# Patient Record
Sex: Female | Born: 1945
Health system: Southern US, Community
[De-identification: ages and names within clinical notes are randomized; demographics above are authoritative.]

## PROBLEM LIST (undated history)

## (undated) DIAGNOSIS — E785 Hyperlipidemia, unspecified: Secondary | ICD-10-CM

## (undated) DIAGNOSIS — F329 Major depressive disorder, single episode, unspecified: Secondary | ICD-10-CM

## (undated) DIAGNOSIS — Z9989 Dependence on other enabling machines and devices: Secondary | ICD-10-CM

## (undated) DIAGNOSIS — F32A Depression, unspecified: Secondary | ICD-10-CM

## (undated) DIAGNOSIS — G4733 Obstructive sleep apnea (adult) (pediatric): Secondary | ICD-10-CM

## (undated) DIAGNOSIS — E119 Type 2 diabetes mellitus without complications: Secondary | ICD-10-CM

## (undated) DIAGNOSIS — Z9289 Personal history of other medical treatment: Secondary | ICD-10-CM

## (undated) DIAGNOSIS — J40 Bronchitis, not specified as acute or chronic: Secondary | ICD-10-CM

## (undated) DIAGNOSIS — Z8673 Personal history of transient ischemic attack (TIA), and cerebral infarction without residual deficits: Secondary | ICD-10-CM

## (undated) DIAGNOSIS — I10 Essential (primary) hypertension: Secondary | ICD-10-CM

## (undated) DIAGNOSIS — R0602 Shortness of breath: Secondary | ICD-10-CM

## (undated) DIAGNOSIS — J189 Pneumonia, unspecified organism: Secondary | ICD-10-CM

## (undated) DIAGNOSIS — G43909 Migraine, unspecified, not intractable, without status migrainosus: Secondary | ICD-10-CM

## (undated) DIAGNOSIS — K5792 Diverticulitis of intestine, part unspecified, without perforation or abscess without bleeding: Secondary | ICD-10-CM

## (undated) HISTORY — DX: Depression, unspecified: F32.A

## (undated) HISTORY — DX: Major depressive disorder, single episode, unspecified: F32.9

## (undated) HISTORY — DX: Personal history of transient ischemic attack (TIA), and cerebral infarction without residual deficits: Z86.73

## (undated) HISTORY — DX: Diverticulitis of intestine, part unspecified, without perforation or abscess without bleeding: K57.92

## (undated) HISTORY — DX: Essential (primary) hypertension: I10

## (undated) HISTORY — DX: Hyperlipidemia, unspecified: E78.5

---

## 1987-02-12 HISTORY — PX: LEFT OOPHORECTOMY: SHX1961

## 1988-01-18 HISTORY — PX: COLECTOMY: SHX59

## 1988-01-18 HISTORY — PX: COLOSTOMY: SHX63

## 1988-04-11 HISTORY — PX: APPENDECTOMY: SHX54

## 1988-04-11 HISTORY — PX: COLOSTOMY REVERSAL: SHX5782

## 1988-04-11 HISTORY — PX: COLECTOMY: SHX59

## 1998-10-13 HISTORY — PX: ABDOMINAL HYSTERECTOMY: SHX81

## 1999-02-12 DIAGNOSIS — Z8673 Personal history of transient ischemic attack (TIA), and cerebral infarction without residual deficits: Secondary | ICD-10-CM

## 1999-02-12 HISTORY — DX: Personal history of transient ischemic attack (TIA), and cerebral infarction without residual deficits: Z86.73

## 2004-04-19 ENCOUNTER — Emergency Department (HOSPITAL_COMMUNITY): Admission: EM | Admit: 2004-04-19 | Discharge: 2004-04-19 | Payer: Self-pay | Admitting: Emergency Medicine

## 2004-05-18 ENCOUNTER — Other Ambulatory Visit: Admission: RE | Admit: 2004-05-18 | Discharge: 2004-05-18 | Payer: Self-pay | Admitting: Family Medicine

## 2004-12-28 ENCOUNTER — Encounter: Admission: RE | Admit: 2004-12-28 | Discharge: 2005-01-25 | Payer: Self-pay | Admitting: Family Medicine

## 2005-07-10 ENCOUNTER — Inpatient Hospital Stay (HOSPITAL_COMMUNITY): Admission: AD | Admit: 2005-07-10 | Discharge: 2005-07-10 | Payer: Self-pay | Admitting: Gynecology

## 2005-07-12 ENCOUNTER — Encounter (INDEPENDENT_AMBULATORY_CARE_PROVIDER_SITE_OTHER): Payer: Self-pay | Admitting: *Deleted

## 2005-07-12 ENCOUNTER — Ambulatory Visit: Payer: Self-pay | Admitting: *Deleted

## 2005-07-12 ENCOUNTER — Other Ambulatory Visit: Admission: RE | Admit: 2005-07-12 | Discharge: 2005-07-12 | Payer: Self-pay | Admitting: *Deleted

## 2005-07-26 ENCOUNTER — Ambulatory Visit: Payer: Self-pay | Admitting: Obstetrics & Gynecology

## 2005-07-30 ENCOUNTER — Encounter (INDEPENDENT_AMBULATORY_CARE_PROVIDER_SITE_OTHER): Payer: Self-pay | Admitting: Specialist

## 2005-07-30 ENCOUNTER — Ambulatory Visit: Payer: Self-pay | Admitting: Obstetrics & Gynecology

## 2005-07-30 ENCOUNTER — Ambulatory Visit (HOSPITAL_COMMUNITY): Admission: RE | Admit: 2005-07-30 | Discharge: 2005-07-30 | Payer: Self-pay | Admitting: Obstetrics & Gynecology

## 2005-08-29 ENCOUNTER — Ambulatory Visit: Payer: Self-pay | Admitting: Obstetrics and Gynecology

## 2005-12-11 ENCOUNTER — Other Ambulatory Visit: Admission: RE | Admit: 2005-12-11 | Discharge: 2005-12-11 | Payer: Self-pay | Admitting: Obstetrics and Gynecology

## 2006-01-13 ENCOUNTER — Encounter (INDEPENDENT_AMBULATORY_CARE_PROVIDER_SITE_OTHER): Payer: Self-pay | Admitting: Specialist

## 2006-01-13 ENCOUNTER — Inpatient Hospital Stay (HOSPITAL_COMMUNITY): Admission: RE | Admit: 2006-01-13 | Discharge: 2006-01-16 | Payer: Self-pay | Admitting: Obstetrics and Gynecology

## 2006-08-05 LAB — HM MAMMOGRAPHY: HM Mammogram: NORMAL

## 2007-04-07 ENCOUNTER — Encounter: Payer: Self-pay | Admitting: Endocrinology

## 2007-04-29 ENCOUNTER — Ambulatory Visit: Payer: Self-pay | Admitting: Endocrinology

## 2007-04-29 DIAGNOSIS — Z8679 Personal history of other diseases of the circulatory system: Secondary | ICD-10-CM

## 2007-04-29 DIAGNOSIS — Z8719 Personal history of other diseases of the digestive system: Secondary | ICD-10-CM | POA: Insufficient documentation

## 2007-04-29 DIAGNOSIS — F329 Major depressive disorder, single episode, unspecified: Secondary | ICD-10-CM

## 2007-04-29 DIAGNOSIS — R609 Edema, unspecified: Secondary | ICD-10-CM

## 2007-04-29 DIAGNOSIS — I10 Essential (primary) hypertension: Secondary | ICD-10-CM

## 2007-04-29 DIAGNOSIS — E785 Hyperlipidemia, unspecified: Secondary | ICD-10-CM | POA: Insufficient documentation

## 2007-04-29 DIAGNOSIS — E669 Obesity, unspecified: Secondary | ICD-10-CM

## 2007-06-01 ENCOUNTER — Ambulatory Visit: Payer: Self-pay | Admitting: Endocrinology

## 2007-07-07 ENCOUNTER — Ambulatory Visit: Payer: Self-pay | Admitting: Endocrinology

## 2007-08-06 ENCOUNTER — Ambulatory Visit: Payer: Self-pay | Admitting: Endocrinology

## 2007-08-10 ENCOUNTER — Telehealth (INDEPENDENT_AMBULATORY_CARE_PROVIDER_SITE_OTHER): Payer: Self-pay | Admitting: *Deleted

## 2007-10-30 ENCOUNTER — Telehealth: Payer: Self-pay | Admitting: Endocrinology

## 2007-11-04 ENCOUNTER — Ambulatory Visit: Payer: Self-pay | Admitting: Endocrinology

## 2007-11-04 LAB — CONVERTED CEMR LAB: Hgb A1c MFr Bld: 6.9 % — ABNORMAL HIGH (ref 4.6–6.0)

## 2007-12-16 ENCOUNTER — Ambulatory Visit: Payer: Self-pay | Admitting: Endocrinology

## 2008-01-25 ENCOUNTER — Telehealth (INDEPENDENT_AMBULATORY_CARE_PROVIDER_SITE_OTHER): Payer: Self-pay | Admitting: *Deleted

## 2008-03-10 ENCOUNTER — Encounter: Admission: RE | Admit: 2008-03-10 | Discharge: 2008-03-10 | Payer: Self-pay | Admitting: Family Medicine

## 2008-03-15 ENCOUNTER — Ambulatory Visit: Payer: Self-pay | Admitting: Endocrinology

## 2008-03-30 ENCOUNTER — Ambulatory Visit (HOSPITAL_COMMUNITY): Admission: RE | Admit: 2008-03-30 | Discharge: 2008-03-31 | Payer: Self-pay | Admitting: Neurosurgery

## 2008-06-14 ENCOUNTER — Ambulatory Visit: Payer: Self-pay | Admitting: Endocrinology

## 2008-06-14 DIAGNOSIS — G589 Mononeuropathy, unspecified: Secondary | ICD-10-CM | POA: Insufficient documentation

## 2008-06-14 LAB — CONVERTED CEMR LAB: Hgb A1c MFr Bld: 7 % — ABNORMAL HIGH (ref 4.6–6.5)

## 2008-06-17 ENCOUNTER — Telehealth (INDEPENDENT_AMBULATORY_CARE_PROVIDER_SITE_OTHER): Payer: Self-pay | Admitting: *Deleted

## 2008-09-13 ENCOUNTER — Ambulatory Visit: Payer: Self-pay | Admitting: Endocrinology

## 2008-09-13 LAB — CONVERTED CEMR LAB: Calcium, Total (PTH): 9.8 mg/dL (ref 8.4–10.5)

## 2008-12-15 ENCOUNTER — Ambulatory Visit: Payer: Self-pay | Admitting: Endocrinology

## 2008-12-15 LAB — CONVERTED CEMR LAB
Creatinine,U: 82.6 mg/dL
Hgb A1c MFr Bld: 8.1 % — ABNORMAL HIGH (ref 4.6–6.5)

## 2009-03-16 ENCOUNTER — Ambulatory Visit: Payer: Self-pay | Admitting: Endocrinology

## 2009-03-16 LAB — CONVERTED CEMR LAB: Hgb A1c MFr Bld: 8.6 % — ABNORMAL HIGH

## 2009-05-05 ENCOUNTER — Telehealth: Payer: Self-pay | Admitting: Endocrinology

## 2009-07-03 ENCOUNTER — Ambulatory Visit: Payer: Self-pay | Admitting: Endocrinology

## 2009-07-03 LAB — CONVERTED CEMR LAB: Hgb A1c MFr Bld: 8.4 % — ABNORMAL HIGH (ref 4.6–6.5)

## 2009-10-09 ENCOUNTER — Ambulatory Visit: Payer: Self-pay | Admitting: Endocrinology

## 2009-10-09 LAB — CONVERTED CEMR LAB: Hgb A1c MFr Bld: 8.3 % — ABNORMAL HIGH (ref 4.6–6.5)

## 2009-11-10 ENCOUNTER — Telehealth: Payer: Self-pay | Admitting: Endocrinology

## 2009-11-14 ENCOUNTER — Ambulatory Visit: Payer: Self-pay | Admitting: Endocrinology

## 2009-11-30 ENCOUNTER — Ambulatory Visit: Payer: Self-pay | Admitting: Endocrinology

## 2009-12-28 ENCOUNTER — Ambulatory Visit: Payer: Self-pay | Admitting: Endocrinology

## 2010-03-13 NOTE — Assessment & Plan Note (Signed)
Summary: form/#/cd   Vital Signs:  Patient profile:   65 year old female Height:      62 inches (157.48 cm) Weight:      205.50 pounds (93.41 kg) BMI:     37.72 O2 Sat:      97 % on Room air Temp:     98.4 degrees F (36.89 degrees C) oral Pulse rate:   73 / minute BP sitting:   124 / 82  (left arm) Cuff size:   large  Vitals Entered By: Brenton Grills MA (October 09, 2009 10:51 AM)  O2 Flow:  Room air CC: 3 month F/U/aj Is Patient Diabetic? Yes   Referring Provider:  k little, md Primary Provider:  k little  CC:  3 month F/U/aj.  History of Present Illness: pt states she feels well in general.  no cbg record, but states cbg's vary from 77 (afternoon, with activity) to high-100's (hs).  she underatands the directions to reduce humalog if exercise is anticipated, but she she is unable to anticipate the exercise.  Allergies: 1)  ! Penicillin 2)  ! Sulfa 3)  ! Tetracycline 4)  ! Erythromycin  Past History:  Past Medical History: Last updated: 04/29/2007 Depression Diabetes mellitus, type I Hyperlipidemia Hypertension Transient ischemic attack, hx of Diverticulitis, hx of  Review of Systems  The patient denies hypoglycemia.    Physical Exam  General:  normal appearance.   Neck:  Supple without thyroid enlargement or tenderness.  Additional Exam:  Hemoglobin A1C       [H]  8.3 %    Impression & Recommendations:  Problem # 1:  DIABETES MELLITUS, TYPE I (ICD-250.01) needs increased rx  Other Orders: TLB-A1C / Hgb A1C (Glycohemoglobin) (83036-A1C) Est. Patient Level III ([address])  Patient Instructions: 1)  blood tests are being ordered for you today.  please call [phone] to hear your test results. 2)  pending the test results, please continue lantus 60 units at bedtime. 3)  Please schedule a follow-up appointment in 3 months. 4)  check your blood sugar 2 times a day.  vary the time of day when you check, between before the 3 meals, and at bedtime.  also  check if you have symptoms of your blood sugar being too high or too low.  please keep a record of the readings and bring it to your next appointment here.  please call us sooner if you are having low blood sugar episodes. 5)  changee novolog to (just before each meal) 20-15-20 units.  if exertion is anticipated, reduce that injection by 5 units. 6)  (update: i left message on phone-tree:  ret 6 weeks with cbg record).

## 2010-03-13 NOTE — Assessment & Plan Note (Signed)
Summary: 3 MTH FU  STC   Vital Signs:  Patient profile:   65 year old female Height:      62 inches (157.48 cm) Weight:      205 pounds (93.18 kg) BMI:     37.63 O2 Sat:      95 % on Room air Temp:     98.5 degrees F (36.94 degrees C) oral Pulse rate:   104 / minute BP sitting:   130 / 66  (left arm) Cuff size:   large  Vitals Entered By: Josph Macho RMA (Jul 03, 2009 8:07 AM)  O2 Flow:  Room air CC: 3 month follow up/ CF Is Patient Diabetic? Yes   Referring Provider:  k little, md Primary Provider:  k little  CC:  3 month follow up/ CF.  History of Present Illness: pt states she feels well in general.  no cbg record, but states cbg's are seldom low.  this is with exertion.  it is highest in am.  Current Medications (verified): 1)  Novolog 100 Unit/ml  Soln (Insulin Aspart) .... Three Times A Day (Qac) 20-20-15 Units 2)  Lantus 100 Unit/ml  Soln (Insulin Glargine) .... 45 Units Qhs 3)  Lipitor 80 Mg  Tabs (Atorvastatin Calcium) .... Take 1 By Mouth Qd 4)  Micardis Hct 80-12.5 Mg  Tabs (Telmisartan-Hctz) .... Take 1 By Mouth Qd 5)  Lisinopril 40 Mg  Tabs (Lisinopril) .... Take 1 By Mouth Qd 6)  Adult Aspirin Ec Low Strength 81 Mg  Tbec (Aspirin) .... Take 1 By Mouth Qd 7)  Bd Ultra-Fine Pen Needles 29g X 12.62mm Misc (Insulin Pen Needle) .... Tid 8)  Gabapentin 300 Mg Caps (Gabapentin) .Marland Kitchen.. 1 Bid  Allergies (verified): 1)  ! Penicillin 2)  ! Sulfa 3)  ! Tetracycline 4)  ! Erythromycin  Past History:  Past Medical History: Last updated: 04/29/2007 Depression Diabetes mellitus, type I Hyperlipidemia Hypertension Transient ischemic attack, hx of Diverticulitis, hx of  Review of Systems  The patient denies syncope.    Physical Exam  General:  obese.  no distress  Pulses:  dorsalis pedis intact bilat. Extremities:  no deformity.  no ulcer on the feet.  feet are of normal color and temp.  there is dry skin and a few callouses on the feet.  there is  bilateral onychomycosis  1+ right pedal edema and 1+ left pedal edema.   Neurologic:  sensation is intact to touch on the feet, but decreased from normal Additional Exam:  Hemoglobin A1C       [H]  8.4 %    Impression & Recommendations:  Problem # 1:  DIABETES MELLITUS, TYPE I (ICD-250.01) needs increased rx  Medications Added to Medication List This Visit: 1)  Lantus 100 Unit/ml Soln (Insulin glargine) .... 60 units at bedtime  Other Orders: TLB-A1C / Hgb A1C (Glycohemoglobin) (83036-A1C) Est. Patient Level III ([address])  Patient Instructions: 1)  blood tests are being ordered for you today.  please call [phone] to hear your test results. 2)  pending the test results, please increase lantus to 60 units at bedtime. 3)  Please schedule a follow-up appointment in 3 months. 4)  check your blood sugar 2 times a day.  vary the time of day when you check, between before the 3 meals, and at bedtime.  also check if you have symptoms of your blood sugar being too high or too low.  please keep a record of the readings and bring it to your  next appointment here.  please call us sooner if you are having low blood sugar episodes. 5)  continue novolog (just before each meal) 20-20-15 units.  if exertion is anticipated, reduce that injection by 5 units. Prescriptions: LANTUS 100 UNIT/ML  SOLN (INSULIN GLARGINE) 60 units at bedtime  #2 vials x 11   Entered and Authorized by:   Minus Breeding MD   Signed by:   Minus Breeding MD on 07/03/2009   Method used:   Electronically to        [facility] (retail)       [address]       [place], [place]  [address]       Ph: [phone]       Fax: [phone]   RxID:   [id number] NOVOLOG 100 UNIT/ML  SOLN (INSULIN ASPART) three times a day (qac) 20-20-15 units  #2 vials x 11   Entered and Authorized by:   Minus Breeding MD   Signed by:   Minus Breeding MD on 07/03/2009   Method used:   Electronically to        [facility] (retail)       [address]       [place], [place]  [address]       Ph: [phone]       Fax: [phone]   RxID:   [id number]

## 2010-03-13 NOTE — Progress Notes (Signed)
Summary: lantus  Phone Note Call from Patient Call back at Home Phone [phone]   Caller: Patient Call For: Minus Breeding MD Reason for Call: Talk to Doctor Summary of Call: Pt left msg on VM stating she currently been taking Lantus. Recieved called from her pharmacy stating new copay is $99.00. Pt states she can't afford med is there  anything else she can take long lasting instead of lantus. Pls advise Initial call taken by: Orlan Leavens RMA,  November 10, 2009 3:56 PM  Follow-up for Phone Call        change lantus to nph, 40 units at bedtime return next week. Follow-up by: Minus Breeding MD,  November 10, 2009 4:06 PM  Additional Follow-up for Phone Call Additional follow up Details #1::        Notified pt with md response. rx sent [facility]. Made appt for pt to see md on 11/14/09 Additional Follow-up by: Orlan Leavens RMA,  November 10, 2009 4:15 PM    New/Updated Medications: HUMULIN N 100 UNIT/ML SUSP (INSULIN ISOPHANE HUMAN) 40 units at bedtime Prescriptions: HUMULIN N 100 UNIT/ML SUSP (INSULIN ISOPHANE HUMAN) 40 units at bedtime  #2 vials x 1   Entered and Authorized by:   Minus Breeding MD   Signed by:   Minus Breeding MD on 11/10/2009   Method used:   Electronically to        [facility] (retail)       [address]       [place], [place]  [address]       Ph: [phone]       Fax: [phone]   RxID:   [id number]

## 2010-03-13 NOTE — Assessment & Plan Note (Signed)
Summary: 4 WK FU  D/T  STC   Vital Signs:  Patient profile:   65 year old female Height:      62.5 inches (158.75 cm) Weight:      205.38 pounds (93.35 kg) BMI:     37.10 O2 Sat:      95 % on Room air Temp:     98.6 degrees F (37.00 degrees C) oral Pulse rate:   83 / minute BP sitting:   118 / 74  (left arm) Cuff size:   large  Vitals Entered By: Brenton Grills CMA Duncan Dull) (December 28, 2009 8:12 AM)  O2 Flow:  Room air CC: Follow-up visit/aj Is Patient Diabetic? Yes   Referring Provider:  k little, md Primary Provider:  k little  CC:  Follow-up visit/aj.  History of Present Illness: no cbg record, but states cbg's are improved.  she says cbg is lower on work days, and is usually highest at hs.    Current Medications (verified): 1)  Novolog 100 Unit/ml  Soln (Insulin Aspart) .... Three Times A Day (Qac) 20-15-10 Units 2)  Lipitor 80 Mg  Tabs (Atorvastatin Calcium) .... Take 1 By Mouth Qd 3)  Micardis Hct 80-12.5 Mg  Tabs (Telmisartan-Hctz) .... Take 1 By Mouth Qd 4)  Lisinopril 40 Mg  Tabs (Lisinopril) .... Take 1 By Mouth Qd 5)  Adult Aspirin Ec Low Strength 81 Mg  Tbec (Aspirin) .... Take 1 By Mouth Qd 6)  Bd Ultra-Fine Pen Needles 29g X 12.30mm Misc (Insulin Pen Needle) .... Tid 7)  Gabapentin 300 Mg Caps (Gabapentin) .Marland Kitchen.. 1 Bid 8)  Humulin N 100 Unit/ml Susp (Insulin Isophane Human) .... 70 Units At Bedtime  Allergies (verified): 1)  ! Penicillin 2)  ! Sulfa 3)  ! Tetracycline 4)  ! Erythromycin  Past History:  Past Medical History: Last updated: 04/29/2007 Depression Diabetes mellitus, type I Hyperlipidemia Hypertension Transient ischemic attack, hx of Diverticulitis, hx of  Review of Systems  The patient denies hypoglycemia.    Physical Exam  General:  obese.  no distress  Pulses:  dorsalis pedis intact bilat. Extremities:  no deformity.  no ulcer on the feet.  feet are of normal color and temp.  there is dry skin and a few callouses on the feet.   there is bilateral onychomycosis  1+ right pedal edema and 1+ left pedal edema.   Neurologic:  sensation is intact to touch on the feet, but decreased from normal Additional Exam:   Hemoglobin A1C       [H]  8.5 %    Impression & Recommendations:  Problem # 1:  DIABETES MELLITUS, TYPE I (ICD-250.01) needs increased rx  Medications Added to Medication List This Visit: 1)  Novolog 100 Unit/ml Soln (Insulin aspart) .... Three times a day (qac) 25-15-20 units  Other Orders: TLB-A1C / Hgb A1C (Glycohemoglobin) (83036-A1C) TLB-A1C / Hgb A1C (Glycohemoglobin) (83036-A1C) Est. Patient Level III ([address])  Patient Instructions: 1)  blood tests are being ordered for you today.  please call [phone] to hear your test results. 2)  pending the test results, please continue the same insulin for now 3)  check your blood sugar 2 times a day.  vary the time of day when you check, between before the 3 meals, and at bedtime.  also check if you have symptoms of your blood sugar being too high or too low.  please keep a record of the readings and bring it to your next appointment here.  please  call us sooner if you are having low blood sugar episodes. 4)  Please schedule a follow-up appointment in 3 months. 5)  (update: i left message on phone-tree:  increase humalog to (just before each meal), 25-15 20 units)   Orders Added: 1)  TLB-A1C / Hgb A1C (Glycohemoglobin) [83036-A1C] 2)  TLB-A1C / Hgb A1C (Glycohemoglobin) [83036-A1C] 3)  Est. Patient Level III [01093]

## 2010-03-13 NOTE — Assessment & Plan Note (Signed)
Summary: 3 MTH FU  STC   Vital Signs:  Patient profile:   65 year old female Height:      62 inches (157.48 cm) Weight:      199.75 pounds (90.80 kg) O2 Sat:      97 % on Room air Temp:     97.6 degrees F (36.44 degrees C) oral Pulse rate:   84 / minute BP sitting:   118 / 70  (left arm) Cuff size:   large  Vitals Entered By: Josph Macho CMA (March 16, 2009 8:08 AM)  O2 Flow:  Room air CC: 3 month follow up/ CF Is Patient Diabetic? Yes   Referring Provider:  k little, md Primary Provider:  k little  CC:  3 month follow up/ CF.  History of Present Illness: pt states she feels well in general.  no cbg record, but states cbg's are well-controlled, except it is often in the high-100's in am.  Current Medications (verified): 1)  Novolog 100 Unit/ml  Soln (Insulin Aspart) .... Three Times A Day (Qac) 20-20-15 Units 2)  Lantus 100 Unit/ml  Soln (Insulin Glargine) .... 40 Units Qhs 3)  Lipitor 80 Mg  Tabs (Atorvastatin Calcium) .... Take 1 By Mouth Qd 4)  Micardis Hct 80-12.5 Mg  Tabs (Telmisartan-Hctz) .... Take 1 By Mouth Qd 5)  Lisinopril 40 Mg  Tabs (Lisinopril) .... Take 1 By Mouth Qd 6)  Adult Aspirin Ec Low Strength 81 Mg  Tbec (Aspirin) .... Take 1 By Mouth Qd 7)  Bd Ultra-Fine Pen Needles 29g X 12.49mm Misc (Insulin Pen Needle) .... Tid 8)  Gabapentin 300 Mg Caps (Gabapentin) .Marland Kitchen.. 1 Bid  Allergies (verified): 1)  ! Penicillin 2)  ! Sulfa 3)  ! Tetracycline 4)  ! Erythromycin  Past History:  Past Medical History: Last updated: 04/29/2007 Depression Diabetes mellitus, type I Hyperlipidemia Hypertension Transient ischemic attack, hx of Diverticulitis, hx of  Review of Systems  The patient denies hypoglycemia.    Physical Exam  General:  obese. no distress  Skin:  insulin injection sites at anterior abdomen are normal  Additional Exam:  Hemoglobin A1C       [H]  8.6 %   Impression & Recommendations:  Problem # 1:  DIABETES MELLITUS, TYPE I  (ICD-250.01) needs increased rx  Medications Added to Medication List This Visit: 1)  Lantus 100 Unit/ml Soln (Insulin glargine) .... 45 units qhs 2)  Lantus 100 Unit/ml Soln (Insulin glargine) .... 50 units qhs  Other Orders: TLB-A1C / Hgb A1C (Glycohemoglobin) (83036-A1C) Est. Patient Level III ([address])  Patient Instructions: 1)  increase lantus to 45 units at bedtime. 2)  Please schedule a follow-up appointment in 3 months. 3)  check your blood sugar 2 times a day.  vary the time of day when you check, between before the 3 meals, and at bedtime.  also check if you have symptoms of your blood sugar being too high or too low.  please keep a record of the readings and bring it to your next appointment here.  please call us sooner if you are having low blood sugar episodes. 4)  tests are being ordered for you today.  a few days after the test(s), please call [phone] to hear your test results.  5)  continue novolog (just before each meal) 20-20-15 units. 6)  (update: i left message on phone-tree:  increase lantus to 50 units at bedtime)

## 2010-03-13 NOTE — Progress Notes (Signed)
Summary: med refill  Phone Note Other Incoming   Details of Request: [facility] Summary of Call: Patient stopped by the office stating that she needs refill of Lantus 45 units as instucted (rather than 40) and Novolog 20/20/15 (instead of 15/20/15). Is this ok? Initial call taken by: Lucious Groves,  May 05, 2009 12:51 PM  Follow-up for Phone Call        yes, please refill both x 1 year, thanks Follow-up by: Minus Breeding MD,  May 05, 2009 1:08 PM  Additional Follow-up for Phone Call Additional follow up Details #1::        Patient notified. Additional Follow-up by: Lucious Groves,  May 05, 2009 2:10 PM    New/Updated Medications: LANTUS 100 UNIT/ML  SOLN (INSULIN GLARGINE) 45 units qhs Prescriptions: LANTUS 100 UNIT/ML  SOLN (INSULIN GLARGINE) 45 units qhs  #1 month x 11   Entered by:   Lucious Groves   Authorized by:   Minus Breeding MD   Signed by:   Lucious Groves on 05/05/2009   Method used:   Electronically to        [facility] (retail)       [address]       [place], [place]  [address]       Ph: [phone]       Fax: [phone]   RxID:   [id number] NOVOLOG 100 UNIT/ML  SOLN (INSULIN ASPART) three times a day (qac) 20-20-15 units  #30 [place] x 11   Entered by:   Lucious Groves   Authorized by:   Minus Breeding MD   Signed by:   Lucious Groves on 05/05/2009   Method used:   Electronically to        [facility] (retail)       [address]       [place], [place]  [address]       Ph: [phone]       Fax: [phone]   RxID:   [id number]

## 2010-03-13 NOTE — Assessment & Plan Note (Signed)
Summary: 2 wk f/u #/cd   Vital Signs:  Patient profile:   65 year old female Height:      62.5 inches (158.75 cm) Weight:      202.50 pounds (92.05 kg) BMI:     36.58 O2 Sat:      95 % on Room air Temp:     98.4 degrees F (36.89 degrees C) oral Pulse rate:   96 / minute BP sitting:   138 / 80  (left arm) Cuff size:   large  Vitals Entered By: Brenton Grills MA (November 30, 2009 8:00 AM)  O2 Flow:  Room air CC: 2 week F/U/aj Is Patient Diabetic? Yes   Referring Shanelle Clontz:  k little, md Primary Laconya Clere:  k little  CC:  2 week F/U/aj.  History of Present Illness: she brings a record of her cbg's which i have reviewed today.  it varies from 400 (am) to 60's (before suppper, and at hs.    Current Medications (verified): 1)  Novolog 100 Unit/ml  Soln (Insulin Aspart) .... Three Times A Day (Qac) 20-20-15 Units 2)  Lipitor 80 Mg  Tabs (Atorvastatin Calcium) .... Take 1 By Mouth Qd 3)  Micardis Hct 80-12.5 Mg  Tabs (Telmisartan-Hctz) .... Take 1 By Mouth Qd 4)  Lisinopril 40 Mg  Tabs (Lisinopril) .... Take 1 By Mouth Qd 5)  Adult Aspirin Ec Low Strength 81 Mg  Tbec (Aspirin) .... Take 1 By Mouth Qd 6)  Bd Ultra-Fine Pen Needles 29g X 12.84mm Misc (Insulin Pen Needle) .... Tid 7)  Gabapentin 300 Mg Caps (Gabapentin) .Marland Kitchen.. 1 Bid 8)  Humulin N 100 Unit/ml Susp (Insulin Isophane Human) .... 60 Units At Bedtime  Allergies (verified): 1)  ! Penicillin 2)  ! Sulfa 3)  ! Tetracycline 4)  ! Erythromycin  Past History:  Past Medical History: Last updated: 04/29/2007 Depression Diabetes mellitus, type I Hyperlipidemia Hypertension Transient ischemic attack, hx of Diverticulitis, hx of  Review of Systems  The patient denies syncope.    Physical Exam  General:  obese.  no distress  Psych:  Alert and cooperative; normal mood and affect; normal attention span and concentration.     Impression & Recommendations:  Problem # 1:  DIABETES MELLITUS, TYPE I (ICD-250.01) needs  increased rx  Medications Added to Medication List This Visit: 1)  Novolog 100 Unit/ml Soln (Insulin aspart) .... Three times a day (qac) 20-15-10 units 2)  Humulin N 100 Unit/ml Susp (Insulin isophane human) .... 70 units at bedtime  Other Orders: Est. Patient Level III ([address]782)  Patient Instructions: 1)  increase nph to 70 units at bedtime.  then continue to increase until morning blood sugar is in the low-100's 2)  check your blood sugar 2 times a day.  vary the time of day when you check, between before the 3 meals, and at bedtime.  also check if you have symptoms of your blood sugar being too high or too low.  please keep a record of the readings and bring it to your next appointment here.  please call us sooner if you are having low blood sugar episodes. 3)  continue novolog (just before each meal) 20-15-10 units.  if exertion is anticipated, reduce that injection by 5 units. 4)  Please schedule a follow-up appointment in 3 weeks.   Orders Added: 1)  Est. Patient Level III [[address]]

## 2010-03-13 NOTE — Assessment & Plan Note (Signed)
Summary: 3 MTH FU $50-STC   Vital Signs:  Patient Profile:   65 Years Old Female Weight:      191.8 pounds Temp:     98.5 degrees F oral Pulse rate:   82 / minute BP sitting:   156 / 82  (left arm) Cuff size:   large  Pt. in pain?   no  Vitals Entered By: Orlan Leavens (November 04, 2007 9:52 AM)                  Referred by:  Wyline Beady, md PCP:  k little  Chief Complaint:  3 MONTH FOLLOW-UP.  History of Present Illness: pt had a cbg of 57 at hs last night.  her cbg was 130 this am, even with lantus at night.  cbg's are highest in the afternoon.  she feels well.    Current Allergies: ! PENICILLIN ! SULFA ! TETRACYCLINE ! ERYTHROMYCIN  Past Medical History:    Reviewed history from 04/29/2007 and no changes required:       Depression       Diabetes mellitus, type I       Hyperlipidemia       Hypertension       Transient ischemic attack, hx of       Diverticulitis, hx of     Review of Systems  The patient denies syncope.     Physical Exam  General:     well developed, well nourished, in no acute distress Neck:     no masses, thyromegaly, or abnormal cervical nodes Additional Exam:     A1C%                 [H]  6.9 %     Impression & Recommendations:  Problem # 1:  DIABETES MELLITUS, TYPE I (ICD-250.01)  Medications Added to Medication List This Visit: 1)  Novolog 100 Unit/ml Soln (Insulin aspart) .... Qac (three times a day) 15-20-15 units 2)  Lantus 100 Unit/ml Soln (Insulin glargine) .... 45 units qhs   Patient Instructions: 1)  reduce lantus to 45 at night 2)  change humalog to 15-20-15 3)  ret 3 mos   ]

## 2010-03-13 NOTE — Assessment & Plan Note (Signed)
Summary: f/u on DM/LMB   Vital Signs:  Patient profile:   65 year old female Height:      62.5 inches Weight:      206.13 pounds BMI:     37.23 O2 Sat:      98 % on Room air Temp:     98.6 degrees F oral Pulse rate:   96 / minute BP sitting:   128 / 64  (left arm) Cuff size:   large  Vitals Entered By: Margaret Pyle, CMA (November 14, 2009 10:43 AM)  O2 Flow:  Room air CC: Elevated CBGs x 2 weeks, pt has not started Humulin   Referring Provider:  k little, md Primary Provider:  k little  CC:  Elevated CBGs x 2 weeks and pt has not started Humulin.  History of Present Illness: she brings a scant record of her cbg's which i have reviewed today.  it varies from 90 (afternoon) to 300 (am).  she will run out of lantus in approx 2 days.  Allergies: 1)  ! Penicillin 2)  ! Sulfa 3)  ! Tetracycline 4)  ! Erythromycin  Past History:  Past Medical History: Last updated: 04/29/2007 Depression Diabetes mellitus, type I Hyperlipidemia Hypertension Transient ischemic attack, hx of Diverticulitis, hx of  Review of Systems  The patient denies hypoglycemia.    Physical Exam  General:  obese.  no distress  Psych:  Alert and cooperative; normal mood and affect; normal attention span and concentration.     Impression & Recommendations:  Problem # 1:  DIABETES MELLITUS, TYPE I (ICD-250.01)  Medications Added to Medication List This Visit: 1)  Humulin N 100 Unit/ml Susp (Insulin isophane human) .... 60 units at bedtime  Other Orders: Flu Vaccine 63yrs + MEDICARE PATIENTS (W0981) Administration Flu vaccine - MCR (G0008) Est. Patient Level III (19147)  Patient Instructions: 1)  change lantus to nph, 60 units at bedtime. 2)  check your blood sugar 2 times a day.  vary the time of day when you check, between before the 3 meals, and at bedtime.  also check if you have symptoms of your blood sugar being too high or too low.  please keep a record of the readings and  bring it to your next appointment here.  please call us sooner if you are having low blood sugar episodes. 3)  continue novolog (just before each meal) 20-15-20 units.  if exertion is anticipated, reduce that injection by 5 units. 4)  Please schedule a follow-up appointment in 2 weeks. Flu Vaccine Consent Questions     Do you have a history of severe allergic reactions to this vaccine? no    Any prior history of allergic reactions to egg and/or gelatin? no    Do you have a sensitivity to the preservative Thimersol? no    Do you have a past history of Guillan-Barre Syndrome? no    Do you currently have an acute febrile illness? no    Have you ever had a severe reaction to latex? no    Vaccine information given and explained to patient? yes    Are you currently pregnant? no    Lot Number:[id number]   Exp Date:08/11/2010   Site Given  Left Deltoid IMep a record of the readings and bring it to your next appointment here.  please call us sooner if you are having low blood sugar episodes. 5)  continue novolog (just before each meal) 20-15-20 units.  if exertion is anticipated, reduce that  injection by 5 units. 6)  Please schedule a follow-up appointment in 2 weeks. Marland Kitchenlbmedflu

## 2010-03-13 NOTE — Medication Information (Signed)
Summary: Diabetes Supplies  Diabetes Supplies   Imported By: Sherian Rein 10/13/2009 10:09:42  _____________________________________________________________________  External Attachment:    Type:   Image     Comment:   External Document

## 2010-03-14 ENCOUNTER — Telehealth: Payer: Self-pay | Admitting: Endocrinology

## 2010-03-21 NOTE — Progress Notes (Signed)
Summary: rx refill req  Phone Note Refill Request Message from:  Fax from Pharmacy on March 14, 2010 9:15 AM  Refills Requested: Medication #1:  GABAPENTIN 300 MG CAPS 1 bid   Dosage confirmed as above?Dosage Confirmed   Last Refilled: 02/11/2010  Method Requested: Electronic Next Appointment Scheduled: 03/29/2010 Initial call taken by: Brenton Grills CMA (AAMA),  March 14, 2010 9:15 AM    Prescriptions: GABAPENTIN 300 MG CAPS (GABAPENTIN) 1 bid  #60 Each x 4   Entered by:   Brenton Grills CMA (AAMA)   Authorized by:   Minus Breeding MD   Signed by:   Brenton Grills CMA (AAMA) on 03/14/2010   Method used:   Electronically to        [facility] (retail)       [address]       [place], [place]  [address]       Ph: [phone]       Fax: [phone]   RxID:   [id number]

## 2010-03-28 ENCOUNTER — Telehealth: Payer: Self-pay | Admitting: Endocrinology

## 2010-03-29 ENCOUNTER — Ambulatory Visit (INDEPENDENT_AMBULATORY_CARE_PROVIDER_SITE_OTHER): Payer: Medicare Other | Admitting: Endocrinology

## 2010-03-29 ENCOUNTER — Encounter: Payer: Self-pay | Admitting: Endocrinology

## 2010-03-29 ENCOUNTER — Other Ambulatory Visit: Payer: Self-pay | Admitting: Endocrinology

## 2010-03-29 DIAGNOSIS — E109 Type 1 diabetes mellitus without complications: Secondary | ICD-10-CM

## 2010-03-29 LAB — HEMOGLOBIN A1C: Hgb A1c MFr Bld: 7.3 % — ABNORMAL HIGH (ref 4.6–6.5)

## 2010-04-04 NOTE — Progress Notes (Signed)
Summary: rx refill req  Phone Note Refill Request Message from:  Fax from Pharmacy on March 28, 2010 11:21 AM  Refills Requested: Medication #1:  GABAPENTIN 300 MG CAPS 1 bid [facility]-90 day supply   Method Requested: Fax to [facility] Next Appointment Scheduled: 03/29/2010 Initial call taken by: Brenton Grills CMA (AAMA),  March 28, 2010 11:21 AM    Prescriptions: GABAPENTIN 300 MG CAPS (GABAPENTIN) 1 bid  #180 x 1   Entered by:   Brenton Grills CMA (AAMA)   Authorized by:   Minus Breeding MD   Signed by:   Brenton Grills CMA (AAMA) on 03/28/2010   Method used:   Faxed to ...       [address] [facility] (mail-order)       [address]       [place], [place]  [address]       Ph: [phone]       Fax: [phone]   RxID:   [id number]

## 2010-04-10 NOTE — Assessment & Plan Note (Signed)
Summary: 3 MTH FU STC Per pt RS to 3 MTH FU from 12/28/09 STC   Vital Signs:  Patient profile:   65 year old female Height:      62.5 inches (158.75 cm) Weight:      204.50 pounds (92.95 kg) BMI:     36.94 O2 Sat:      98 % on Room air Temp:     98.6 degrees F (37.00 degrees C) oral Pulse rate:   74 / minute Pulse rhythm:   regular BP sitting:   120 / 70  (left arm) Cuff size:   large  Vitals Entered By: Brenton Grills CMA (AAMA) (March 29, 2010 8:11 AM)  O2 Flow:  Room air CC: 3 month F/U/aj Is Patient Diabetic? Yes   Referring Provider:  k little, md Primary Provider:  k little  CC:  3 month F/U/aj.  History of Present Illness: few weeks of slight pain at the right foot, and assoc ecchymosis.  this happend after she rolled a cart over the foot.  sxs are much better now. no cbg record, but states cbg's are sometimes low in the afternoon.  it is highest in am (higher than at hs).    Current Medications (verified): 1)  Novolog 100 Unit/ml  Soln (Insulin Aspart) .... Three Times A Day (Qac) 25-15-20 Units 2)  Lipitor 80 Mg  Tabs (Atorvastatin Calcium) .... Take 1 By Mouth Qd 3)  Micardis Hct 80-12.5 Mg  Tabs (Telmisartan-Hctz) .... Take 1 By Mouth Qd 4)  Lisinopril 40 Mg  Tabs (Lisinopril) .... Take 1 By Mouth Qd 5)  Adult Aspirin Ec Low Strength 81 Mg  Tbec (Aspirin) .... Take 1 By Mouth Qd 6)  Bd Ultra-Fine Pen Needles 29g X 12.53mm Misc (Insulin Pen Needle) .... Tid 7)  Gabapentin 300 Mg Caps (Gabapentin) .Marland Kitchen.. 1 Bid 8)  Humulin N 100 Unit/ml Susp (Insulin Isophane Human) .... 70 Units At Bedtime  Allergies (verified): 1)  ! Penicillin 2)  ! Sulfa 3)  ! Tetracycline 4)  ! Erythromycin  Past History:  Past Medical History: Last updated: 04/29/2007 Depression Diabetes mellitus, type I Hyperlipidemia Hypertension Transient ischemic attack, hx of Diverticulitis, hx of  Review of Systems  The patient denies syncope.         denies numbness  Physical  Exam  General:  obese.  no distress  Pulses:  dorsalis pedis intact bilat. Extremities:  no deformity.  no ulcer on the feet.  feet are of normal color and temp.  there is dry skin and a few callouses on the feet.  there is bilateral onychomycosis  1+ right pedal edema and 1+ left pedal edema.   Neurologic:  sensation is intact to touch on the feet, but decreased from normal Additional Exam:   Hemoglobin A1C       [H]  7.3 %    Impression & Recommendations:  Problem # 1:  DIABETES MELLITUS, TYPE I (ICD-250.01) she needs some adjustment in her therapy  Problem # 2:  minor foot injury new much better  Medications Added to Medication List This Visit: 1)  Novolog 100 Unit/ml Soln (Insulin aspart) .... Three times a day (qac) 25-10-20 units 2)  Humulin N 100 Unit/ml Susp (Insulin isophane human) .... 75 units at bedtime  Other Orders: TLB-A1C / Hgb A1C (Glycohemoglobin) (83036-A1C) Est. Patient Level IV (04540)  Patient Instructions: 1)  blood tests are being ordered for you today.  please call [phone] to hear your test results. 2)  pending the test results, please continue the same insulin for now 3)  check your blood sugar 2 times a day.  vary the time of day when you check, between before the 3 meals, and at bedtime.  also check if you have symptoms of your blood sugar being too high or too low.  please keep a record of the readings and bring it to your next appointment here.  please call us sooner if you are having low blood sugar episodes. 4)  Please schedule a follow-up appointment in 3 months. 5)  pending the test results, please reduce humalog to (just before each meal), 25-10-20 units), and: 6)  increase nph to 75 units at bedtime. 7)  (update: i left message on phone-tree:  rx as we discussed)   Orders Added: 1)  TLB-A1C / Hgb A1C (Glycohemoglobin) [83036-A1C] 2)  Est. Patient Level IV [[address]]

## 2010-05-07 ENCOUNTER — Other Ambulatory Visit (INDEPENDENT_AMBULATORY_CARE_PROVIDER_SITE_OTHER): Payer: Medicare Other | Admitting: Endocrinology

## 2010-05-07 DIAGNOSIS — E109 Type 1 diabetes mellitus without complications: Secondary | ICD-10-CM

## 2010-05-29 LAB — BASIC METABOLIC PANEL
BUN: 13 mg/dL (ref 6–23)
Calcium: 10.4 mg/dL (ref 8.4–10.5)
GFR calc non Af Amer: >60 mL/min (ref >60)
Potassium: 3.7 mEq/L (ref 3.5–5.1)
Sodium: 140 mEq/L (ref 135–145)

## 2010-05-29 LAB — GLUCOSE, CAPILLARY
Glucose-Capillary: 194 mg/dL — ABNORMAL HIGH (ref 70–99)
Glucose-Capillary: 231 mg/dL — ABNORMAL HIGH (ref 70–99)
Glucose-Capillary: 236 mg/dL — ABNORMAL HIGH (ref 70–99)
Glucose-Capillary: 274 mg/dL — ABNORMAL HIGH (ref 70–99)
Glucose-Capillary: 363 mg/dL — ABNORMAL HIGH (ref 70–99)

## 2010-05-29 LAB — CBC
HCT: 40.1 % (ref 36.0–46.0)
MCHC: 34.9 g/dL (ref 30.0–36.0)
MCV: 93.9 fL (ref 78.0–100.0)
WBC: 11.4 10*3/uL — ABNORMAL HIGH (ref 4.0–10.5)

## 2010-05-29 LAB — APTT: aPTT: 27 seconds (ref 24–37)

## 2010-05-29 LAB — URINALYSIS, ROUTINE W REFLEX MICROSCOPIC: Hgb urine dipstick: NEGATIVE

## 2010-06-26 ENCOUNTER — Other Ambulatory Visit: Payer: Self-pay | Admitting: *Deleted

## 2010-06-26 DIAGNOSIS — E119 Type 2 diabetes mellitus without complications: Secondary | ICD-10-CM

## 2010-06-26 MED ORDER — INSULIN ASPART 100 UNIT/ML ~~LOC~~ SOLN: SUBCUTANEOUS | Status: DC

## 2010-06-26 NOTE — Telephone Encounter (Signed)
R'cd fax from [facility] for refill of pt's Novolog  Last OV-03/29/2010

## 2010-06-26 NOTE — Op Note (Signed)
Brittany Rojas, TESSMER NO.:  [id number]   MEDICAL RECORD NO.:  [id number]          PATIENT TYPE:  OIB   LOCATION:  3537                         FACILITY:  [facility]   PHYSICIAN:  Clydene Fake, M.D.  DATE OF BIRTH:  [birth year]   DATE OF PROCEDURE:  03/30/2008  DATE OF DISCHARGE:                               OPERATIVE REPORT   PREOPERATIVE DIAGNOSES:  Lumbar stenosis, spondylosis, herniated nucleus  pulposus with right-sided radiculopathy, L4-5.   POSTOPERATIVE DIAGNOSES:  Lumbar stenosis, spondylosis, herniated  nucleus pulposus with right-sided radiculopathy, L4-5.   PROCEDURES:  Right L4 and L5 decompressive laminectomy and diskectomy,  microdissection with microscope.   SURGEON:  Clydene Fake, MD   ASSISTANT:  Dr. Lovell Sheehan.   ANESTHESIA:  General endotracheal tube anesthesia.   ESTIMATED BLOOD LOSS:  Minimal.   COMPLICATIONS:  None.   DRAINS:  None.   REASON FOR PROCEDURE:  The patient is a 65 year old woman with right  hip, leg pain and numbness since November, has not improved.  An MRI was  done showing a very large disk herniation, right side, L4-5, going  cephalad causing canal stenosis, lateral recess stenosis, and  compression of 4 and 5 roots.  The patient was brought in for  decompression.   PROCEDURE IN DETAIL:  The patient was brought into the operating room,  and general anesthesia was induced. The patient was placed in the prone  position on Wilson frame with all pressure points padded.  The patient  was prepped and draped in sterile fashion.  Site of incision was  injected with 10 mL of 1% lidocaine with epinephrine.  Needle was placed  in the interspace.  X-ray was obtained showing needle was pointed right  at the 4-5 disk space in the middle of the spinous processes.  Incision  was then made centered where the needle was and incision taken down to  the fascia.  Hemostasis was obtained with Bovie cauterization.  The  fascia was  incised with a Bovie on the right side, and subperiosteal  dissection was done over the L4 spinous process and lamina and extended  to the L3 and down to the L5 lamina out to facet.  Self-retaining  retractor was placed and markers were placed in the 3-4 and 4-5  interspaces and x-rays obtained confirming our positioning.  Decompressive laminectomy was then started with a high-speed drill,  removing most of the lamina of L4 medial facet, top of L5.  The  microscope was brought in for microdissection, and hemilaminectomy was  then continued decompressing the central canal and decompressing  posteriorly over the 4 and 5 roots.  We then explored the epidural  space, and a large disk herniation was seen coming out of the 4-5  interspace and going cephalad up under the L4 root.  Diskectomy was then  done with the hooks removing pieces of disk, and this was very  fragmented, came out in multiple pieces as we swept various hooks up  under the dura, up under the 4 root and the central dura bringing the  pieces down  into our view and then removing them.  A spondylitic bulge  over the 4-5 disk was removed with osteophyte tool, and there was no  easy access into the disk space and we did not open up into the 4-5 disk  space.  There was a tremendous amount of disk up under the 4 root, and  again with the various hooks, we were able to mobilize them and bring  them down and then remove them.  When we were finished, we had good  decompression of the 4 and 5 roots in going out their foramen and the  central canal.  We irrigated with antibiotic solution.  We had good  hemostasis with Gelfoam and thrombin.  This was irrigated.  We again  explored the nerve roots.  We had good decompression, and retractors  were removed.  Fascia was closed with 0 Vicryl interrupted sutures.  Subcutaneous tissue was closed with 0, 2-0, and 3-0 Vicryl interrupted  sutures.  Skin was closed with benzoin and Steri-Strips.   Dressing was  placed.  The patient was placed back in supine position, awoken from  anesthesia, and transferred to recovery room in stable condition.           ______________________________  Clydene Fake, M.D.     JRH/MEDQ  D:  03/30/2008  T:  03/31/2008  Job:  [id number]

## 2010-06-28 MED ORDER — INSULIN ASPART 100 UNIT/ML ~~LOC~~ SOLN: SUBCUTANEOUS | Status: DC

## 2010-06-28 NOTE — Telephone Encounter (Signed)
Addended by: Brenton Grills on: 06/28/2010 02:11 PM   Modules accepted: Orders

## 2010-06-28 NOTE — Telephone Encounter (Addendum)
Printed rx to be faxed to [facility] because of refill error. Rx faxed to Pharmacy.

## 2010-06-29 NOTE — Consult Note (Signed)
Brittany Rojas, Brittany Rojas                 ACCOUNT NO.:  [id number]   MEDICAL RECORD NO.:  [id number]          PATIENT TYPE:  INP   LOCATION:  9320                          [facility]   PHYSICIAN:  Corinna L. Lendell Caprice, MDDATE OF BIRTH:  1945/05/12   DATE OF CONSULTATION:  01/14/2006  DATE OF DISCHARGE:                                 CONSULTATION   REASON FOR CONSULTATION:  Diabetes.   IMPRESSION/RECOMMENDATIONS:  1. Insulin requiring diabetes: Patient reports that she is eating well      and in fact is eating more here than she usually eats at home. Her      blood glucose currently for supper is 221. I recommend resuming her      home dose of Humalog with meals which is 13 units SQ with      breakfast; 18 units SQ with lunch and supper, and stop the sliding      scale to minimize chance of hypoglycemia. Also recommend resuming      her evening Lantus dose at 55 units nightly.  2. Hypertension is controlled off medications.  3. Postoperative day number one status post total abdominal      hysterectomy and right salpingo-oophorectomy.   HISTORY OF PRESENT ILLNESS:  Brittany Rojas is a pleasant 65 year old white  female patient who was admitted to [facility] after having a  hysterectomy yesterday by Dr. Sydnee Cabal. Her primary care physician is  Dr. Idell Pickles. I am asked to consult for diabetes. Patient reports that her  blood glucose's usually range from about 118 to 180 at home.  She has  had no nausea and has been tolerating solids without any vomiting.   PAST MEDICAL HISTORY:  As above. She also has a history of perforated  diverticula with colostomy and subsequent take-down.   MEDICATIONS AT HOME:  1. NovoLog 13 units SQ q.a.m., 18 units at lunch and 18 units at      supper.  2. Lantus 55 units SQ q.h.s.  3. Lisinopril 20 mg daily.  4. Micardis 80 mg daily.  5. Lipitor.   SOCIAL HISTORY:  She does not drink or smoke. She is married.   FAMILY HISTORY:  Noncontributory.   REVIEW OF SYSTEMS:  As above, otherwise negative.   PHYSICAL EXAMINATION:  VITAL SIGNS: Her temperature is 98.3, pulse 95,  respiratory rate 20, blood pressure 115/50, oxygen saturation 96% on  room air.  GENERAL: The patient is an overweight white female in no acute distress.  HEENT: Normocephalic, atraumatic. Pupils are equal, round and reactive  to light. Sclerae non icteric. Moist mucous membranes.  NECK: Supple, no lymphadenopathy.  LUNGS: Clear to auscultation bilaterally without wheezes, rhonchi or  rales.  CARDIOVASCULAR: Regular rate and rhythm without murmurs, rubs, or  gallops.  ABDOMEN: Normal bowel sounds, soft, her dressing is clean, dry and  intact.  GU/RECTAL: Deferred.  EXTREMITIES: She has 1+ pitting edema bilaterally. Pulses are intact.  SKIN: No rash.  PSYCHIATRIC: Normal affect.  NEUROLOGIC: Alert and oriented. Cranial nerves and sensory motor exam  are intact.   LABORATORY DATA:  White blood  cell count is 17,000, hemoglobin 10.9,  hematocrit 31.6, platelet count 281,000. Basic metabolic panel is  essentially unremarkable.   IMPRESSIONS/RECOMMENDATIONS:  As above.   I would like to thank Dr. Sydnee Cabal for this consultation.      Corinna L. Lendell Caprice, MD  Electronically Signed     CLS/MEDQ  D:  01/14/2006  T:  01/14/2006  Job:  [id number]   cc:   Dellis Anes. Idell Pickles, M.D.  Fax: [phone]

## 2010-06-29 NOTE — Op Note (Signed)
NAMEPREET, MANGANO NO.:  [id number]   MEDICAL RECORD NO.:  [id number]          PATIENT TYPE:  AMB   LOCATION:  SDC                           [facility]   PHYSICIAN:  Lesly Dukes, M.D. DATE OF BIRTH:  16-Jul-1945   DATE OF PROCEDURE:  07/30/2005  DATE OF DISCHARGE:                                 OPERATIVE REPORT   PREOPERATIVE DIAGNOSIS:  A 65 year old female with postmenopausal bleeding  and endometrial polyp by biopsy.   POSTOPERATIVE DIAGNOSIS:  A 65 year old female with postmenopausal bleeding  and endometrial polyp by biopsy.   PROCEDURE:  Dilatation and curettage, hysteroscopy.   SURGICAL HISTORY:  Lesly Dukes, M.D.   ANESTHESIA:  General.   PATHOLOGY:  Endometrial curettings to pathology.   ESTIMATED BLOOD LOSS:  Minimal.   COMPLICATIONS:  None.   FINDINGS:  The uterus sounded to 12 cm, slightly retroverted.  Multiple  endometrial polyps visualized on hysteroscopy.   PROCEDURE:  After informed consent was obtained, the patient was taken to  the operating room, where general anesthesia was induced.  The patient was  placed in the dorsal lithotomy position and prepared and draped in the  normal sterile fashion.  The bladder was emptied with a catheter.  A  speculum was placed into the patient's vagina and the anterior lip of the  cervix was grasped with a single-tooth tenaculum.  The cervical os was  gently dilated with Hegar dilators to a #9.  The hysteroscope was introduced  and hysteroscopy was performed.  There was a large amount of clot and  endometrial polyps noted.  Curettage was performed gently and endometrial  curettings were sent to pathology.  At the end of the procedure a  hysteroscopy revealed that all the polyps were removed.  All instruments  were then removed from the patient's vagina.  There was good hemostasis.  Fluid deficit was 100 mL.  The patient tolerated the procedure well.  Sponge, lap, instrument and  needle count were correct x2.  The patient went  to recovery in stable condition.           ______________________________  Lesly Dukes, M.D.     KHL/MEDQ  D:  07/30/2005  T:  07/30/2005  Job:  [id number]

## 2010-06-29 NOTE — Group Therapy Note (Signed)
NAMEMONQUIE, FULGHAM NO.:  [id number]   MEDICAL RECORD NO.:  [id number]          PATIENT TYPE:  WOC   LOCATION:  [facility]                   FACILITY:  [facility]   PHYSICIAN:  Elsie Lincoln, MD      DATE OF BIRTH:  01-22-1946   DATE OF SERVICE:                                    CLINIC NOTE   HISTORY OF PRESENT ILLNESS:  The patient is a 65 year old female who has  been menopausal for 5 years.  Over the past couple years, she has some  episodes of heavy bleeding.  She underwent an endometrial biopsy that showed  rare fragmented endometrial tissue with features suggestive of endometrial  polyp.  She is still having some episodes of bleeding and needs to get this  polyp removed.  This will have to be done in the emergency room.  We  discussed D&C hysteroscopy.  We told her the risks include, but are not  limited to, bleeding, infection, and damage to the uterus.  The patient  inquired about hysterectomy; however, this had a lot more morbidity, and she  has probable a lot of adhesive disease from a ruptured diverticula and  subsequent exploratory laparotomy and colonoscopy that was needed.   PAST MEDICAL HISTORY:  1.  Diabetes.  2.  High blood pressure.   PAST SURGICAL HISTORY:  Ruptured diverticulitis with removal of left ovary  and partial bowel resection.   MEDICATIONS:  1.  NovoLog.  2.  Lantus.  3.  Lisinopril.   ALLERGIES:  1.  PENICILLIN.  2.  SULFA.  3.  MORPHINE.  4.  ERYTHROMYCIN.   ASSESSMENT AND PLAN:  A 65 year old female with endometrial polyp and  vaginal bleeding.  The patient needs D&C hysteroscopy.  Will try to schedule  next week.  The patient will be called with this appointment.           ______________________________  Elsie Lincoln, MD     KL/MEDQ  D:  07/26/2005  T:  07/26/2005  Job:  [id number]

## 2010-06-29 NOTE — Op Note (Signed)
Brittany Rojas, Brittany Rojas                 ACCOUNT NO.:  [id number]   MEDICAL RECORD NO.:  [id number]          PATIENT TYPE:  INP   LOCATION:  9320                          [facility]   PHYSICIAN:  Charles A. Delcambre, MDDATE OF BIRTH:  Jul 08, 1945   DATE OF PROCEDURE:  01/13/2006  DATE OF DISCHARGE:                               OPERATIVE REPORT   PREOPERATIVE DIAGNOSIS:  Postmenopausal bleeding and questionable  endometrial polyp versus submucosal fibroid, rapidly growing, and new  onset over the last 6 months.   POSTOPERATIVE DIAGNOSES:  1. Postmenopausal bleeding and questionable endometrial polyp versus      submucosal fibroid, rapidly growing, and new onset over the last 6      months.  2. Severe pelvic adhesive disease.   PROCEDURE:  1. Transabdominal hysterectomy.  2. Right salpingo-oophorectomy.  3. Extensive lysis of adhesions, taking greater than 1 hour (Modifier      22).   SURGEON:  Charles A. Sydnee Cabal, MD   ASSISTANT:  Harlan Stains   COMPLICATIONS:  Pelvic adhesive disease.   ESTIMATED BLOOD LOSS:  125 mL   SPECIMENS:  Uterus and right ovary and tube to pathology.   INSTRUMENTS:  Sponge and needle count correct x2.   ANESTHESIA:  General by the endotracheal route.   DESCRIPTION OF PROCEDURE:  The patient was taken to the operating room  and placed in the supine position and anesthesia the induced without  difficulty.  She was then sterilely prepped and draped.  A vertical skin  incision was made with the knife and carried down to fascia.  The fascia  was incised with the knife and the Mayo scissors without injury to the  bowel.  There was extensive bowel adhesions to the anterior fascia. With  great care and Metzenbaum scissors and some electrocautery, the bowel  and omentum were taken down off the anterior abdominal wall, allowing  exposure.  Further dissection of bowel adhesions down into the pelvis  were carefully taken to free the bowel from the uterus,  pedicles with #0  Vicryl as well as sharp dissection with the Metzenbaum scissors were  used to mobilize the bowel laterally with adequate exposure.  Extensive  dissection of adhesions taken over greater than 1 hour.  A Balfour  retractor was placed and moistened laps were used to pack the bowel out  of the pelvis.  Corneal regions were grasped with Kelly clamps. Round  ligaments were transected on either side and broad ligaments were opened  on either side.  Bladder was dropped anteriorly and left ovary and tube  were surgically absent.  Uterine vessel was skeletonized and simple tied  on the left, after the infundibulopelvic ligament was isolated and the  utero-ovarian pedicle was tied with a  free tie and then transfixion  stitch on the right with #0 Vicryl.  The uterine vessel on the right was  then stitched with a simple stitch as well.  Hemostasis was adequate.  Successive pedicles were taken down and the remainder of the cardinal  ligaments to the vaginal angles in the uterosacral ligaments transfixed  and stitch with #0 Vicryl on either side.  The vaginal angle was  entered.  Once we were down on the isthmus before entry into the  uterosacral ligaments the corpus was amputated from the cervical stump  allowing greater exposure.  Further dissection, at that time obtained  the uterosacral ligaments on either side; and the vaginal angles on  either side.  The cervix was amputated from the vaginal cuff.  The  bladder had been taken down successfully with sharp dissection with the  Metzenbaum scissors.  There was no evidence of injury to the bladder,  throughout the case.  Richardson anchor sutures were placed on either  vaginal angle and then the remainder of the vaginal cuff was closed with  a single figure-of-eight #0 Vicryl with good hemostasis resulting.  A  single area on the right peritoneal side wall was grasped with a right  angle suture and sutured with 2-0 Vicryl with good  hemostasis resulting.  Irrigation was carried out and all pedicles were of good hemostasis.   Attention was then turned to the right ovary.  The ureter was isolated  well away from the infundibulopelvic pedicle.  The pedicle was cross  clamped, and the ovary and tube were excised and a free-tie and  transfixion stitch were placed on the infundibulopelvic pedicle and  hemostasis was excellent.  Irrigation was carried out.  Once again,  hemostasis was verified at the vaginal cuff and the bladder, as well as  all pedicles and side walls.  All instruments were removed.  Laps were  removed.  The area of dissection of the adhesions were verified to be of  good hemostasis and a single suture was required on one area of the  omentum and this was placed with 2-0 Vicryl with good hemostasis  resulting.  Subfascial hemostasis was excellent.  There was no evidence  of injury to the bowel during dissection or inspection.  The fascia was  then closed with #0 PDS loop with care given to not encroach on the  bowel.  Adhesions laterally now wide after dissection.  Subcutaneous  irrigation was carried out.  Subcutaneous tissue was released for better  approximation; #0 Vicryl subcutaneous tissues were placed and sterile  skin and clips were applied to close the incision.  The incision was  covered then with a sterile pressure dressing.  The patient was taken to  recovery with the physician in attendance having tolerated the procedure  well.      Charles A. Sydnee Cabal, MD  Electronically Signed     CAD/MEDQ  D:  01/13/2006  T:  01/13/2006  Job:  [id number]

## 2010-06-29 NOTE — Group Therapy Note (Signed)
Brittany Rojas, Brittany Rojas NO.:  [id number]   MEDICAL RECORD NO.:  [id number]          PATIENT TYPE:  WOC   LOCATION:  [facility]                   FACILITY:  [facility]   PHYSICIAN:  Ellis Parents, MD    DATE OF BIRTH:  [birth year]   DATE OF SERVICE:  07/12/2005                                    CLINIC NOTE   This 65 year old female comes in with a history of bleeding of approximately  3 years duration, although the bleeding in the past 1 month has become  heavy.  The patient had spontaneous menopause at age 51 and was totally  amenorrheic for 2 years, then she started very light vaginal spotting which  persisted up until about 1-2 months ago, and it has gradually progressed to  be heavy with the passage of clots.  The patient was on some estrogen  replacement therapy for 2 years, the exact medication is unknown at this  time.   PHYSICAL EXAMINATION:  External genitalia are normal.  The vagina contains a  minimal amount of blood.  The cervix is clean.  The uterus is anterior and  upper limits of normal size and sounds to 9 cm to 10 cm in depth.  There are  no palpable masses present.   An endometrial biopsy with a Pipelle is performed, 3 passes, and a good  specimen is obtained.  The patient is to return in 2 weeks for review of the  pathology report.           ______________________________  Ellis Parents, MD     SA/MEDQ  D:  07/12/2005  T:  07/12/2005  Job:  [id number]

## 2010-06-29 NOTE — Discharge Summary (Signed)
NAMEJAYNI, Brittany Rojas                 ACCOUNT NO.:  [id number]   MEDICAL RECORD NO.:  [id number]          PATIENT TYPE:  INP   LOCATION:  9320                          [facility]ILITY[facility]  [facility]   PHYSICIAN:  Charles A. Delcambre, MDDATE OF BIRTH:  30-Aug-1945   DATE OF ADMISSION:  01/13/2006  DATE OF DISCHARGE:                               DISCHARGE SUMMARY   PRIMARY DISCHARGE DIAGNOSES:  1. Postmenopausal bleeding.  2. Endometrial polyp.  3. Uterine leiomyomata.  4. Adenomyosis.  5. Severe pelvic adhesive disease.  6. Ovarian fibroma.   PROCEDURE:  1. Transabdominal hysterectomy.  2. Right salpingo-oophorectomy.  3. Extensive lysis of adhesions.   DISPOSITION:  Patient discharged home with followup in the office on  January 20, 2006 to discontinue staples.  She was given convalescence  instructions to rest home, no driving for 2 weeks, no bath, but a shower  okay for 2 weeks, no lifting greater than 25 pounds for 1 month and no  vaginal intercourse for 4 weeks, call for any temperature over 100  degrees, significant nausea or vomiting, inability to have p.o. intake  or low sugars, incisional drainage or erythema.   DISCHARGE MEDICATIONS:  1. Percocet 5/325 mg one to two p.o. q.4 h., #40.  2. Tandem one p.o. daily, #30.  3. Will go back on home regimen of NovoLog 13 units q.a.m., 18 units      at lunch and Lantus 55 units at night.  4. She will start Micardis 80 mg once daily on January 17, 2006.   LABORATORY DATA:  Postoperative hemoglobin 10.9, hematocrit 31.7.   Pathology as noted above from uterus, cervix and right tube and ovary  (see discharge diagnoses).   HOSPITAL COURSE:  The patient was admitted and underwent surgery as  noted above.  Postoperatively, she had routine postoperative course.  She was given some clear liquids the evening of surgery, day of surgery  and she continued on a sliding scale for insulin.  Postop day #1, her  catheter was discontinued with good urine  output.  She ambulated without  difficulty.  She had spontaneous return of flatus and was given an ADA  diet and sliding scale was continued.  Internal Medicine, [facility], was consulted to manage getting her back onto her home  regimen; this was done on postop day #2 and she was observed and  continued for diabetic management over postop day #2.  Pain was  controlled with p.o. medication.  She had good control with sugars that  had ranged up to 250,  controlled down into the 100-125 range and fasting sugar was 91 on day  of discharge, postop day #3.  Blood pressures were 143-152/66-76 on  evening of postop day #2 to day #3; for this reason, we continued to  withhold blood pressure medication until postop day #4.  All questions  were answered.  She will follow up as noted.      Charles A. Sydnee Cabal, MD  Electronically Signed     CAD/MEDQ  D:  01/16/2006  T:  01/16/2006  Job:  [id number]

## 2010-06-29 NOTE — H&P (Signed)
[name], Rojas                 ACCOUNT NO.:  [id number]   MEDICAL RECORD NO.:  [id number]          PATIENT TYPE:  AMB   LOCATION:                                [facility]   PHYSICIAN:  Brittany Rojas, MDDATE OF BIRTH:  12/16/1945   DATE OF ADMISSION:  01/13/2006  DATE OF DISCHARGE:                              HISTORY & PHYSICAL   HISTORY OF PRESENT ILLNESS:  The patient to be admitted on January 13, 2006, to undergo transabdominal hysterectomy and right salpingo-  oophorectomy with noted history of past left salpingo-oophorectomy  secondary to postmenopausal bleeding, recurrent, after hysteroscopy and  D&C was done for endometrial polyps with Dr. Penne Rojas in June 2007.  Hysteroscopic findings were of an otherwise normal endometrial cavity at  that time.  No evidence of a fibroid.  On ultrasound here and  sonohysterogram, there is an apparent hyperechoic mass on the posterior  wall of 3.5 x 2 cm significant for a new submucosal fibroid that is  large and of new generation since 4 months ago evidently.  For this  reason, I had recommended for definitive procedure, we go on and proceed  with hysterectomy.  She gives informed consent and accepts the risks of  infection, bleeding, bowel and bladder damage, blood product risks  including hepatitis and HIV exposure, ureteral damage, and understanding  she is high risk for bowel injury and adhesive disease with known  history of ruptured diverticula, colostomy, and exploratory laparotomy  done in the past with reversal of the colostomy at a later date.  At  that time, she had an open incision to heal primarily.  At the time of  her hysteroscopy and D&C, an oozing area at the superior aspect of the  incision was closed by Dr. Penne Rojas and has no longer given her any  problem.  She states that this did not ooze anything like stool but had  been present since several years now, initially not present after the  prior surgery and  healing but currently in the past several years had  just been an area that seemed to not want to heal.  She is diabetic.  She did have her left ovary removed at the time of her diverticular  surgery and colostomy in 1989.  She sees Dr. Idell Rojas in [place] at [facility] and reports normal breast exam, normal mammogram, and Pap smear  within the last year.   PAST MEDICAL HISTORY:  Diabetes and hypertension.   SURGICAL HISTORY:  Hysteroscopy, laparotomy with colostomy, reversal of  colostomy.  Left salpingo-oophorectomy at that time with a laparotomy,  and D&C prior in years past.   MEDICATIONS:  1. Insulin including NovoLog 13 units a.m., 18 at lunch, 18 at dinner,      and then Lantus 55 at h.s.  2. Lisinopril 20 mg a day.  3. Lipitor, dose not specified.  4. Micardis 80 mg once a day, taken in the morning.   ALLERGIES:  SULFA, PENICILLIN, ERYTHROMYCIN AND MORPHINE.  REACTIONS NOT  SPECIFIED.   SOCIAL HISTORY:  Denies tobacco, ethanol,  or drug use or STD exposure in  the past.  She is married and in a monogamous relationship with her  husband.   FAMILY HISTORY:  She states no major illnesses, no uterine cancer, or  cervical cancer.   PHYSICAL EXAMINATION:  GENERAL:  Alert and oriented x3.  No distress.  VITAL SIGNS:  Blood pressure 138/70, heart rate 88, respirations 18,  weight 198 pounds.  Height 5 feet 3-1/4-inches.  HEENT:  Grossly within normal limits.  NECK:  Supple without thyromegaly or adenopathy.  LUNGS:  Clear bilaterally.  HEART:  Regular rate and rhythm without murmur, rub, or gallop.  BREAST:  Symmetrical and deferred otherwise.  ABDOMEN:  Nontender.  Obesity complicates exam.  Evidence of a vertical  skin incision is present, well healed, but appears consistent with a  history of primary intention healing in the past.  PELVIC:  Normal external female genitalia. Bartholin, urethral, Skene  within normal limits.  Vault without discharge or lesions.  There is  a  small amount of dark blood in the vault.  Moderate cystocele and  rectocele, asymptomatic.  Cervix is high in the vault and multiparous in  appearance.  Pap smear was done December 11, 2005, and was negative.  The  remainder of the examination is limited by the patient's body habitus.  The uterus did not palpate to be significantly enlarged beyond 10 weeks.  Adnexa nontender without masses bilaterally.  I did not palpate the  ovary on the right.  RECTAL:  Exam not done.  Anus and perineal body appeared normal.   ASSESSMENT:  1. Postmenopausal bleeding, 627.1.  2. Pelvic pain, 625.9.   PLAN:  Transabdominal hysterectomy, right salpingo-oophorectomy.  The  patient is aware that she is high risk for adhesive disease and findings  in this regard.  We will proceed with bowel prep and GoLYTELY, CMP, CBC,  type and screen, preoperative EKG or chest x-ray per Anesthesia,  preoperative antibiotics of gentamicin plus clindamycin will be given.  She will remain n.p.o. except for water up until midnight, n.p.o.  thereafter.  She will go ahead and have lunch on the day prior and start  the bowel prep about 4 or 5 p.m.  She will take her lunch insulin,  thereafter, take no insulin.  She will keep track of her sugars.  Any  sugar over 150, she is to notify the doctor on call.  Take the sugar at  evening and bedtime.  She will take her Micardis in the morning with a  sip of water.  All questions were answered and we will proceed as  outlined.      Brittany A. Sydnee Cabal, MD  Electronically Signed     CAD/MEDQ  D:  01/06/2006  T:  01/06/2006  Job:  [id number]   cc:   Brittany Rojas, M.D.  Fax: [phone]

## 2010-07-03 ENCOUNTER — Other Ambulatory Visit (INDEPENDENT_AMBULATORY_CARE_PROVIDER_SITE_OTHER): Payer: Medicare Other

## 2010-07-03 ENCOUNTER — Encounter: Payer: Self-pay | Admitting: Endocrinology

## 2010-07-03 ENCOUNTER — Ambulatory Visit (INDEPENDENT_AMBULATORY_CARE_PROVIDER_SITE_OTHER): Payer: Medicare Other | Admitting: Endocrinology

## 2010-07-03 VITALS — BP 124/68 | HR 87 | Temp 98.4°F | Ht 62.5 in | Wt 207.4 lb

## 2010-07-03 DIAGNOSIS — E109 Type 1 diabetes mellitus without complications: Secondary | ICD-10-CM

## 2010-07-03 MED ORDER — FLUTICASONE PROPIONATE 0.05 % EX CREA: TOPICAL_CREAM | Freq: Three times a day (TID) | CUTANEOUS | Status: DC | PRN

## 2010-07-03 NOTE — Patient Instructions (Addendum)
blood tests are being ordered for you today.  please call [phone] to hear your test results.  You will be prompted to enter the 9-digit "MRN" number that appears at the top left of this page, followed by #.  Then you will hear the message. pending the test results, please increase humalog to (just before each meal), 25-10-25 units), and: continue nph at 75 units at bedtime. i have sent a prescription to your pharmacy for skin cream.  Please make a follow-up appointment in 3 months Cc dr little (update: i left message on phone-tree:  Increase supper humalog to 30 units).

## 2010-07-03 NOTE — Progress Notes (Signed)
  Subjective:    Patient ID: Brittany Rojas, female    DOB: 04/03/1945, 65 y.o.   MRN: [id number]  HPI Pt states 10 days of moderate rash at the left forearm, but no assoc itching.  She says this started when she was working in weeds outside her home. no cbg record, but states cbg's are well-controlled, except slightly high at hs, and lowest in the afternoon.   Past Medical History  Diagnosis Date  . Depression   . Diabetes mellitus   . Hyperlipidemia   . Hypertension   . Diverticulitis   . History of transient ischemic attack (TIA)     Past Surgical History  Procedure Date  . Abdominal hysterectomy   . Colectomy     History   Social History  . Marital Status: Married    Spouse Name: N/A    Number of Children: N/A  . Years of Education: N/A   Occupational History  . Not on file.   Social History Main Topics  . Smoking status: Former Games developer  . Smokeless tobacco: Not on file  . Alcohol Use: Not on file  . Drug Use: Not on file  . Sexually Active: Not on file   Other Topics Concern  . Not on file   Social History Narrative  . No narrative on file    Current Outpatient Prescriptions on File Prior to Visit  Medication Sig Dispense Refill  . aspirin 81 MG EC tablet Take 81 mg by mouth daily.        Marland Kitchen atorvastatin (LIPITOR) 80 MG tablet Take 80 mg by mouth daily.        Marland Kitchen gabapentin (NEURONTIN) 300 MG capsule Take 300 mg by mouth 2 (two) times daily.        . insulin NPH (HUMULIN N) 100 UNIT/ML injection Inject 75 Units into the skin at bedtime.  20 mL  4  . Insulin Pen Needle (BD ULTRA-FINE PEN NEEDLES) 29G X 12.7MM MISC by Does not apply route 3 (three) times daily.        Marland Kitchen lisinopril (PRINIVIL,ZESTRIL) 40 MG tablet Take 40 mg by mouth daily.        Marland Kitchen telmisartan-hydrochlorothiazide (MICARDIS HCT) 80-12.5 MG per tablet Take 1 tablet by mouth daily.          Allergies  Allergen Reactions  . Erythromycin   . Penicillins   . Sulfonamide Derivatives   .  Tetracycline     Family History  Problem Relation Age of Onset  . Diabetes Mother     BP 124/68  Pulse 87  Temp(Src) 98.4 F (36.9 C) (Oral)  Ht 5' 2.5" (1.588 m)  Wt 207 lb 6.4 oz (94.076 kg)  BMI 37.33 kg/m2  SpO2 95%    Review of Systems Denies itching and hypoglycemia.    Objective:   Physical Exam GENERAL: no distress Left forearm: moderate eczematous rash    Lab Results  Component Value Date   HGBA1C 8.1* 07/03/2010      Assessment & Plan:  Dm, needs increased rx Contact dermatitis, new problem

## 2010-09-25 ENCOUNTER — Other Ambulatory Visit: Payer: Self-pay | Admitting: Endocrinology

## 2010-10-02 ENCOUNTER — Ambulatory Visit: Payer: Medicare Other | Admitting: Endocrinology

## 2010-10-02 ENCOUNTER — Encounter: Payer: Self-pay | Admitting: Endocrinology

## 2010-10-02 ENCOUNTER — Other Ambulatory Visit (INDEPENDENT_AMBULATORY_CARE_PROVIDER_SITE_OTHER): Payer: Medicare Other

## 2010-10-02 ENCOUNTER — Ambulatory Visit (INDEPENDENT_AMBULATORY_CARE_PROVIDER_SITE_OTHER): Payer: Medicare Other | Admitting: Endocrinology

## 2010-10-02 VITALS — BP 138/82 | HR 89 | Temp 97.9°F | Ht 62.5 in | Wt 207.0 lb

## 2010-10-02 DIAGNOSIS — E109 Type 1 diabetes mellitus without complications: Secondary | ICD-10-CM

## 2010-10-02 NOTE — Patient Instructions (Addendum)
blood tests are being ordered for you today.  please call [phone] to hear your test results.  You will be prompted to enter the 9-digit "MRN" number that appears at the top left of this page, followed by #.  Then you will hear the message. pending the test results, please increase humalog To (just before each meal), 25-10-35 units, and: continue nph at 75 units at bedtime. Please make a follow-up appointment in 3 months.  (update: i left message on phone-tree:  Increase humalog to 30-15-35 units.)

## 2010-10-02 NOTE — Progress Notes (Signed)
  Subjective:    Patient ID: Brittany Rojas, female    DOB: 1945/04/01, 65 y.o.   MRN: [id number]  HPI pt states she feels well in general, except for left knee pain (she will see ortho tomorrow).  no cbg record, but states cbg's are well-controlled.  There is no trend throughout the day, except it is higher at hs, than in am.   Past Medical History  Diagnosis Date  . Depression   . Diabetes mellitus   . Hyperlipidemia   . Hypertension   . Diverticulitis   . History of transient ischemic attack (TIA)     Past Surgical History  Procedure Date  . Abdominal hysterectomy   . Colectomy     History   Social History  . Marital Status: Married    Spouse Name: N/A    Number of Children: N/A  . Years of Education: N/A   Occupational History  . Not on file.   Social History Main Topics  . Smoking status: Former Games developer  . Smokeless tobacco: Not on file  . Alcohol Use: Not on file  . Drug Use: Not on file  . Sexually Active: Not on file   Other Topics Concern  . Not on file   Social History Narrative  . No narrative on file    Current Outpatient Prescriptions on File Prior to Visit  Medication Sig Dispense Refill  . aspirin 81 MG EC tablet Take 81 mg by mouth daily.        Marland Kitchen atorvastatin (LIPITOR) 80 MG tablet Take 80 mg by mouth daily.        . fluticasone (CUTIVATE) 0.05 % cream Apply topically 3 (three) times daily as needed. For rash  30 g  0  . gabapentin (NEURONTIN) 300 MG capsule Take 300 mg by mouth 2 (two) times daily.        Marland Kitchen HUMULIN N 100 UNIT/ML injection INJECT 75 UNITS SUBCUTANEOUSLY AT BEDTIME  20 mL  3  . insulin aspart (NOVOLOG) 100 UNIT/ML injection Inject into the skin three times daily before meals 25-10-35 units      . Insulin Pen Needle (BD ULTRA-FINE PEN NEEDLES) 29G X 12.7MM MISC by Does not apply route 3 (three) times daily.        Marland Kitchen lisinopril (PRINIVIL,ZESTRIL) 40 MG tablet Take 40 mg by mouth daily.        Marland Kitchen telmisartan-hydrochlorothiazide  (MICARDIS HCT) 80-12.5 MG per tablet Take 1 tablet by mouth daily.          Allergies  Allergen Reactions  . Erythromycin   . Penicillins   . Sulfonamide Derivatives   . Tetracycline    Family History  Problem Relation Age of Onset  . Diabetes Mother    BP 138/82  Pulse 89  Temp(Src) 97.9 F (36.6 C) (Oral)  Ht 5' 2.5" (1.588 m)  Wt 207 lb (93.895 kg)  BMI 37.26 kg/m2  SpO2 97%  Review of Systems denies hypoglycemia    Objective:   Physical Exam Pulses: dorsalis pedis intact bilat.   Feet: no deformity.  no ulcer on the feet.  feet are of normal color and temp.  1+ bilat leg edema.  There is bilteral onychomycosis Neuro: sensation is intact to touch on the feet Lab Results  Component Value Date   HGBA1C 8.0* 10/02/2010      Assessment & Plan:  Dm, needs increased rx

## 2010-11-14 ENCOUNTER — Other Ambulatory Visit: Payer: Self-pay | Admitting: *Deleted

## 2010-11-14 MED ORDER — GABAPENTIN 300 MG PO CAPS: 300.0000 mg | ORAL_CAPSULE | Freq: Two times a day (BID) | ORAL | Status: DC

## 2010-11-14 NOTE — Telephone Encounter (Signed)
R'cd fax from [facility] for refill of Gabapentin  Last OV-10/02/2010

## 2010-12-13 DIAGNOSIS — J189 Pneumonia, unspecified organism: Secondary | ICD-10-CM

## 2010-12-13 HISTORY — DX: Pneumonia, unspecified organism: J18.9

## 2011-01-10 ENCOUNTER — Ambulatory Visit (INDEPENDENT_AMBULATORY_CARE_PROVIDER_SITE_OTHER): Payer: Medicare Other | Admitting: Endocrinology

## 2011-01-10 ENCOUNTER — Encounter: Payer: Self-pay | Admitting: Endocrinology

## 2011-01-10 ENCOUNTER — Telehealth: Payer: Self-pay | Admitting: *Deleted

## 2011-01-10 DIAGNOSIS — E109 Type 1 diabetes mellitus without complications: Secondary | ICD-10-CM

## 2011-01-10 MED ORDER — INSULIN LISPRO 100 UNIT/ML ~~LOC~~ SOLN: SUBCUTANEOUS | Status: DC

## 2011-01-10 MED ORDER — INSULIN ASPART 100 UNIT/ML ~~LOC~~ SOLN: SUBCUTANEOUS | Status: DC

## 2011-01-10 NOTE — Progress Notes (Signed)
  Subjective:    Patient ID: Brittany Rojas, female    DOB: August 25, 1945, 65 y.o.   MRN: [id number]  HPI Pt returns for f/u of insulin-requiring DM.  she brings a record of her cbg's which i have reviewed today.  It varies from 89-250.  It is lowest before breakfast, and before lunch.  Several days ago, she had an episode of severe hypoglycemia, after a smaller-than-expected breakfast.  She takes humalog 20 units tid (qac).   Past Medical History  Diagnosis Date  . Depression   . Diabetes mellitus   . Hyperlipidemia   . Hypertension   . Diverticulitis   . History of transient ischemic attack (TIA)     Past Surgical History  Procedure Date  . Abdominal hysterectomy   . Colectomy     History   Social History  . Marital Status: Married    Spouse Name: N/A    Number of Children: N/A  . Years of Education: N/A   Occupational History  . Not on file.   Social History Main Topics  . Smoking status: Former Games developer  . Smokeless tobacco: Not on file  . Alcohol Use: Not on file  . Drug Use: Not on file  . Sexually Active: Not on file   Other Topics Concern  . Not on file   Social History Narrative  . No narrative on file    Current Outpatient Prescriptions on File Prior to Visit  Medication Sig Dispense Refill  . aspirin 81 MG EC tablet Take 81 mg by mouth daily.        Marland Kitchen atorvastatin (LIPITOR) 80 MG tablet Take 80 mg by mouth daily.        Marland Kitchen gabapentin (NEURONTIN) 300 MG capsule Take 1 capsule (300 mg total) by mouth 2 (two) times daily.  180 capsule  2  . HUMULIN N 100 UNIT/ML injection INJECT 75 UNITS SUBCUTANEOUSLY AT BEDTIME  20 mL  3  . Insulin Pen Needle (BD ULTRA-FINE PEN NEEDLES) 29G X 12.7MM MISC by Does not apply route 3 (three) times daily.        Marland Kitchen lisinopril (PRINIVIL,ZESTRIL) 40 MG tablet Take 40 mg by mouth daily.        Marland Kitchen telmisartan-hydrochlorothiazide (MICARDIS HCT) 80-12.5 MG per tablet Take 1 tablet by mouth daily.          Allergies  Allergen Reactions    . Erythromycin   . Penicillins   . Sulfonamide Derivatives   . Tetracycline     Family History  Problem Relation Age of Onset  . Diabetes Mother     BP 136/60  Pulse 95  Temp(Src) 98.4 F (36.9 C) (Oral)  Ht 5\' 3"  (1.6 m)  Wt 210 lb (95.255 kg)  BMI 37.20 kg/m2  SpO2 97%  Review of Systems No weight change.      Objective:   Physical Exam VITAL SIGNS:  See vs page GENERAL: no distress SKIN:  Insulin injection sites at the anterior abdomen are normal.    Assessment & Plan:  DM, needs increased rx.  Therapy limited by hypoglycemia

## 2011-01-10 NOTE — Telephone Encounter (Signed)
Ok to change--same dosage 

## 2011-01-10 NOTE — Telephone Encounter (Signed)
R'cd fax from [facility] to change Humalog rx to Novolog. Novolg is on pt's formulary and Humalog is not.

## 2011-01-10 NOTE — Telephone Encounter (Signed)
Rx changed to Novolog and sent to [facility] pharmacy. Pt informed of new rx.

## 2011-01-10 NOTE — Patient Instructions (Addendum)
pending the test results, please change humalog to (just before each meal), 12-06-23 units, and: continue nph at 75 units at bedtime. Please make a follow-up appointment in 3 months.

## 2011-02-08 ENCOUNTER — Other Ambulatory Visit: Payer: Self-pay | Admitting: Endocrinology

## 2011-03-13 DIAGNOSIS — Z85828 Personal history of other malignant neoplasm of skin: Secondary | ICD-10-CM | POA: Diagnosis not present

## 2011-03-13 DIAGNOSIS — L570 Actinic keratosis: Secondary | ICD-10-CM | POA: Diagnosis not present

## 2011-03-18 DIAGNOSIS — M2030 Hallux varus (acquired), unspecified foot: Secondary | ICD-10-CM | POA: Diagnosis not present

## 2011-04-01 DIAGNOSIS — M2010 Hallux valgus (acquired), unspecified foot: Secondary | ICD-10-CM | POA: Diagnosis not present

## 2011-04-01 DIAGNOSIS — G5760 Lesion of plantar nerve, unspecified lower limb: Secondary | ICD-10-CM | POA: Diagnosis not present

## 2011-04-01 DIAGNOSIS — M79609 Pain in unspecified limb: Secondary | ICD-10-CM | POA: Diagnosis not present

## 2011-04-01 DIAGNOSIS — M779 Enthesopathy, unspecified: Secondary | ICD-10-CM | POA: Diagnosis not present

## 2011-04-01 DIAGNOSIS — M2040 Other hammer toe(s) (acquired), unspecified foot: Secondary | ICD-10-CM | POA: Diagnosis not present

## 2011-04-08 ENCOUNTER — Telehealth: Payer: Self-pay | Admitting: *Deleted

## 2011-04-08 NOTE — Telephone Encounter (Signed)
R'cd letter from [employer] that [employer] N is not covered on formulary. Formulary alternatives are Humulin N and R.

## 2011-04-08 NOTE — Telephone Encounter (Signed)
Ok to change--same dosage 

## 2011-04-09 MED ORDER — INSULIN NPH (HUMAN) (ISOPHANE) 100 UNIT/ML ~~LOC~~ SUSP: 75.0000 [IU] | Freq: Every day | SUBCUTANEOUS | Status: DC

## 2011-04-09 NOTE — Telephone Encounter (Signed)
Pt informed of new rx for Humulin N, rx sent to [facility].

## 2011-04-09 NOTE — Telephone Encounter (Signed)
Left message for pt to callback office to inform of new medication change and for pharmacy she wants rx sent to.

## 2011-04-10 DIAGNOSIS — M79609 Pain in unspecified limb: Secondary | ICD-10-CM | POA: Diagnosis not present

## 2011-04-10 DIAGNOSIS — B351 Tinea unguium: Secondary | ICD-10-CM | POA: Diagnosis not present

## 2011-04-10 DIAGNOSIS — M779 Enthesopathy, unspecified: Secondary | ICD-10-CM | POA: Diagnosis not present

## 2011-04-11 ENCOUNTER — Ambulatory Visit: Payer: Medicare Other | Admitting: Endocrinology

## 2011-04-25 ENCOUNTER — Encounter: Payer: Self-pay | Admitting: Endocrinology

## 2011-04-25 ENCOUNTER — Ambulatory Visit (INDEPENDENT_AMBULATORY_CARE_PROVIDER_SITE_OTHER): Payer: Medicare Other | Admitting: Endocrinology

## 2011-04-25 ENCOUNTER — Other Ambulatory Visit (INDEPENDENT_AMBULATORY_CARE_PROVIDER_SITE_OTHER): Payer: Medicare Other

## 2011-04-25 VITALS — BP 132/76 | HR 79 | Temp 97.9°F | Ht 62.5 in | Wt 211.6 lb

## 2011-04-25 DIAGNOSIS — E109 Type 1 diabetes mellitus without complications: Secondary | ICD-10-CM

## 2011-04-25 NOTE — Progress Notes (Signed)
  Subjective:    Patient ID: Brittany Rojas, female    DOB: 19-May-1945, 66 y.o.   MRN: [id number]  HPI Pt returns for f/u of insulin-requiring DM (1996).  She continues to feel better since her recent pneumonia episode.  She has intermittent mild hypoglycemia before breakfast.  It is otherwise well-controlled, except for a recent steriod injection into her hammer toe.   Past Medical History  Diagnosis Date  . Depression   . Diabetes mellitus   . Hyperlipidemia   . Hypertension   . Diverticulitis   . History of transient ischemic attack (TIA)     Past Surgical History  Procedure Date  . Abdominal hysterectomy   . Colectomy     History   Social History  . Marital Status: Married    Spouse Name: N/A    Number of Children: N/A  . Years of Education: N/A   Occupational History  . Not on file.   Social History Main Topics  . Smoking status: Former Games developer  . Smokeless tobacco: Not on file  . Alcohol Use: Not on file  . Drug Use: Not on file  . Sexually Active: Not on file   Other Topics Concern  . Not on file   Social History Narrative  . No narrative on file    Current Outpatient Prescriptions on File Prior to Visit  Medication Sig Dispense Refill  . aspirin 81 MG EC tablet Take 81 mg by mouth daily.        Marland Kitchen atorvastatin (LIPITOR) 80 MG tablet Take 80 mg by mouth daily.        Marland Kitchen gabapentin (NEURONTIN) 300 MG capsule Take 1 capsule (300 mg total) by mouth 2 (two) times daily.  180 capsule  2  . insulin lispro (HUMALOG) 100 UNIT/ML injection 3x a day (just before each meal) 12-06-23 units  60 mL  3  . Insulin Pen Needle (BD ULTRA-FINE PEN NEEDLES) 29G X 12.7MM MISC by Does not apply route 3 (three) times daily.        Marland Kitchen lisinopril (PRINIVIL,ZESTRIL) 40 MG tablet Take 40 mg by mouth daily.        . insulin aspart (NOVOLOG) 100 UNIT/ML injection Inject subcutaneously three times daily just before each meal 12-06-23 units  60 mL  3    Allergies  Allergen Reactions  .  Erythromycin   . Penicillins   . Sulfonamide Derivatives   . Tetracycline     Family History  Problem Relation Age of Onset  . Diabetes Mother     BP 132/76  Pulse 79  Temp(Src) 97.9 F (36.6 C) (Oral)  Ht 5' 2.5" (1.588 m)  Wt 211 lb 9.6 oz (95.981 kg)  BMI 38.09 kg/m2  SpO2 95%  Review of Systems Denies weight change    Objective:   Physical Exam VITAL SIGNS:  See vs page GENERAL: no distress NECK: There is no palpable thyroid enlargement.  No thyroid nodule is palpable.  No palpable lymphadenopathy at the anterior neck.   Lab Results  Component Value Date   HGBA1C 7.5* 04/25/2011      Assessment & Plan:  DM, Based on the pattern of her cbg's, she needs some adjustment in her therapy

## 2011-04-25 NOTE — Patient Instructions (Addendum)
blood tests are being requested for you today.  You will receive a letter with results. pending the test results, please continue humalog, (just before each meal), 12-06-23 units , and: reduce nph to 65 units at bedtime. Please make a follow-up appointment in 3 months.  check your blood sugar 2 times a day.  vary the time of day when you check, between before the 3 meals, and at bedtime.  also check if you have symptoms of your blood sugar being too high or too low.  please keep a record of the readings and bring it to your next appointment here.  please call us sooner if your blood sugar goes below 70, or if it stays over 200.

## 2011-05-09 DIAGNOSIS — E1139 Type 2 diabetes mellitus with other diabetic ophthalmic complication: Secondary | ICD-10-CM | POA: Diagnosis not present

## 2011-05-09 DIAGNOSIS — H40019 Open angle with borderline findings, low risk, unspecified eye: Secondary | ICD-10-CM | POA: Diagnosis not present

## 2011-05-09 DIAGNOSIS — M79609 Pain in unspecified limb: Secondary | ICD-10-CM | POA: Diagnosis not present

## 2011-05-09 DIAGNOSIS — B351 Tinea unguium: Secondary | ICD-10-CM | POA: Diagnosis not present

## 2011-05-24 DIAGNOSIS — J45909 Unspecified asthma, uncomplicated: Secondary | ICD-10-CM | POA: Diagnosis not present

## 2011-05-28 DIAGNOSIS — Z1231 Encounter for screening mammogram for malignant neoplasm of breast: Secondary | ICD-10-CM | POA: Diagnosis not present

## 2011-06-06 DIAGNOSIS — M79609 Pain in unspecified limb: Secondary | ICD-10-CM | POA: Diagnosis not present

## 2011-06-06 DIAGNOSIS — B351 Tinea unguium: Secondary | ICD-10-CM | POA: Diagnosis not present

## 2011-07-05 DIAGNOSIS — B351 Tinea unguium: Secondary | ICD-10-CM | POA: Diagnosis not present

## 2011-07-05 DIAGNOSIS — M79609 Pain in unspecified limb: Secondary | ICD-10-CM | POA: Diagnosis not present

## 2011-07-15 DIAGNOSIS — R0681 Apnea, not elsewhere classified: Secondary | ICD-10-CM | POA: Diagnosis not present

## 2011-07-15 DIAGNOSIS — R05 Cough: Secondary | ICD-10-CM | POA: Diagnosis not present

## 2011-07-15 DIAGNOSIS — H6120 Impacted cerumen, unspecified ear: Secondary | ICD-10-CM | POA: Diagnosis not present

## 2011-07-15 DIAGNOSIS — R059 Cough, unspecified: Secondary | ICD-10-CM | POA: Diagnosis not present

## 2011-07-18 ENCOUNTER — Ambulatory Visit (INDEPENDENT_AMBULATORY_CARE_PROVIDER_SITE_OTHER): Payer: Medicare Other | Admitting: Endocrinology

## 2011-07-18 ENCOUNTER — Other Ambulatory Visit (INDEPENDENT_AMBULATORY_CARE_PROVIDER_SITE_OTHER): Payer: Medicare Other

## 2011-07-18 ENCOUNTER — Encounter: Payer: Self-pay | Admitting: Endocrinology

## 2011-07-18 VITALS — BP 148/68 | HR 88 | Temp 99.1°F | Ht 60.0 in | Wt 211.0 lb

## 2011-07-18 DIAGNOSIS — E1142 Type 2 diabetes mellitus with diabetic polyneuropathy: Secondary | ICD-10-CM | POA: Diagnosis not present

## 2011-07-18 DIAGNOSIS — E1149 Type 2 diabetes mellitus with other diabetic neurological complication: Secondary | ICD-10-CM

## 2011-07-18 DIAGNOSIS — E1049 Type 1 diabetes mellitus with other diabetic neurological complication: Secondary | ICD-10-CM

## 2011-07-18 DIAGNOSIS — E1065 Type 1 diabetes mellitus with hyperglycemia: Secondary | ICD-10-CM

## 2011-07-18 LAB — HEMOGLOBIN A1C: Hgb A1c MFr Bld: 7 % — ABNORMAL HIGH (ref 4.6–6.5)

## 2011-07-18 NOTE — Progress Notes (Signed)
Subjective:    Patient ID: Brittany Rojas, female    DOB: 04/18/1945, 66 y.o.   MRN: [id number]  HPI Pt returns for f/u of insulin-requiring DM (dx'ed 1996; complicated by TIA and peripheral sensory neuropathy).  She recently had to restart her prednisone.  she brings a record of her cbg's which i have reviewed today.  It varies from 97-300.  Prior to the prednisone, it was well-controlled.   Past Medical History  Diagnosis Date  . Depression   . Diabetes mellitus   . Hyperlipidemia   . Hypertension   . Diverticulitis   . History of transient ischemic attack (TIA)     Past Surgical History  Procedure Date  . Abdominal hysterectomy   . Colectomy     History   Social History  . Marital Status: Married    Spouse Name: N/A    Number of Children: N/A  . Years of Education: N/A   Occupational History  . Not on file.   Social History Main Topics  . Smoking status: Former Games developer  . Smokeless tobacco: Not on file  . Alcohol Use: Not on file  . Drug Use: Not on file  . Sexually Active: Not on file   Other Topics Concern  . Not on file   Social History Narrative  . No narrative on file    Current Outpatient Prescriptions on File Prior to Visit  Medication Sig Dispense Refill  . albuterol (PROAIR HFA) 108 (90 BASE) MCG/ACT inhaler Inhale 2 puffs into the lungs every 4 (four) hours as needed.      Marland Kitchen aspirin 81 MG EC tablet Take 81 mg by mouth daily.        Marland Kitchen atorvastatin (LIPITOR) 80 MG tablet Take 80 mg by mouth daily.        Marland Kitchen gabapentin (NEURONTIN) 300 MG capsule Take 1 capsule (300 mg total) by mouth 2 (two) times daily.  180 capsule  2  . insulin lispro (HUMALOG) 100 UNIT/ML injection 3x a day (just before each meal) 12-06-23 units  60 mL  3  . insulin NPH (HUMULIN N,NOVOLIN N) 100 UNIT/ML injection Inject 65 Units into the skin at bedtime.      . Insulin Pen Needle (BD ULTRA-FINE PEN NEEDLES) 29G X 12.7MM MISC by Does not apply route 3 (three) times daily.        Marland Kitchen  lisinopril (PRINIVIL,ZESTRIL) 40 MG tablet Take 40 mg by mouth daily.        Marland Kitchen losartan-hydrochlorothiazide (HYZAAR) 50-12.5 MG per tablet Take 1 tablet by mouth daily.      . insulin aspart (NOVOLOG) 100 UNIT/ML injection Inject subcutaneously three times daily just before each meal 12-06-23 units  60 mL  3  . DISCONTD: telmisartan-hydrochlorothiazide (MICARDIS HCT) 80-12.5 MG per tablet Take 1 tablet by mouth daily.          Allergies  Allergen Reactions  . Erythromycin   . Penicillins   . Sulfonamide Derivatives   . Tetracycline     Family History  Problem Relation Age of Onset  . Diabetes Mother     BP 148/68  Pulse 88  Temp 99.1 F (37.3 C) (Oral)  Ht 5' (1.524 m)  Wt 211 lb (95.709 kg)  BMI 41.21 kg/m2  SpO2 93%   Review of Systems denies hypoglycemia    Objective:   Physical Exam VITAL SIGNS:  See vs page GENERAL: no distress Pulses: dorsalis pedis intact bilat.   Feet: no deformity.  no  ulcer on the feet.  feet are of normal color and temp.  Trace bilat leg edema.  There is bilateral onychomycosis.  Neuro: sensation is intact to touch on the feet  Lab Results  Component Value Date   HGBA1C 7.0* 07/18/2011      Assessment & Plan:  Dm.  well-controlled

## 2011-07-18 NOTE — Patient Instructions (Addendum)
blood tests are being requested for you today.  You will receive a letter with results. pending the test results, please continue humalog, (just before each meal), 12-06-23 units , and: reduce nph to 65 units at bedtime.  While you are on the prednisone, take an extra 5 units for any blood sugar over 300.  Call if this doesn't help.   Please make a follow-up appointment in 3 months.  check your blood sugar 2 times a day.  vary the time of day when you check, between before the 3 meals, and at bedtime.  also check if you have symptoms of your blood sugar being too high or too low.  please keep a record of the readings and bring it to your next appointment here.  please call us sooner if your blood sugar goes below 70, or if it stays over 200.

## 2011-07-19 LAB — MICROALBUMIN / CREATININE URINE RATIO
Creatinine,U: 33.4 mg/dL
Microalb, Ur: 2.2 mg/dL — ABNORMAL HIGH (ref 0.0–1.9)

## 2011-07-22 DIAGNOSIS — R0989 Other specified symptoms and signs involving the circulatory and respiratory systems: Secondary | ICD-10-CM | POA: Diagnosis not present

## 2011-07-22 DIAGNOSIS — R0681 Apnea, not elsewhere classified: Secondary | ICD-10-CM | POA: Diagnosis not present

## 2011-07-22 DIAGNOSIS — R0609 Other forms of dyspnea: Secondary | ICD-10-CM | POA: Diagnosis not present

## 2011-07-25 ENCOUNTER — Ambulatory Visit: Payer: Medicare Other | Admitting: Endocrinology

## 2011-08-26 DIAGNOSIS — G4733 Obstructive sleep apnea (adult) (pediatric): Secondary | ICD-10-CM | POA: Diagnosis not present

## 2011-09-09 ENCOUNTER — Other Ambulatory Visit: Payer: Self-pay | Admitting: *Deleted

## 2011-09-09 MED ORDER — GABAPENTIN 300 MG PO CAPS: 300.0000 mg | ORAL_CAPSULE | Freq: Two times a day (BID) | ORAL | Status: DC

## 2011-09-09 NOTE — Telephone Encounter (Signed)
R'cd fax from [facility] for refill of Gabapentin

## 2011-09-27 ENCOUNTER — Other Ambulatory Visit: Payer: Self-pay

## 2011-09-27 MED ORDER — INSULIN ASPART 100 UNIT/ML ~~LOC~~ SOLN: SUBCUTANEOUS | Status: DC

## 2011-10-18 DIAGNOSIS — M79609 Pain in unspecified limb: Secondary | ICD-10-CM | POA: Diagnosis not present

## 2011-10-24 ENCOUNTER — Ambulatory Visit: Payer: Medicare Other | Admitting: Endocrinology

## 2011-10-29 ENCOUNTER — Encounter: Payer: Self-pay | Admitting: Endocrinology

## 2011-10-29 ENCOUNTER — Ambulatory Visit (INDEPENDENT_AMBULATORY_CARE_PROVIDER_SITE_OTHER): Payer: Medicare Other | Admitting: Endocrinology

## 2011-10-29 VITALS — BP 132/78 | HR 92 | Temp 99.1°F | Ht 63.0 in | Wt 217.0 lb

## 2011-10-29 DIAGNOSIS — G4730 Sleep apnea, unspecified: Secondary | ICD-10-CM

## 2011-10-29 DIAGNOSIS — E1149 Type 2 diabetes mellitus with other diabetic neurological complication: Secondary | ICD-10-CM | POA: Diagnosis not present

## 2011-10-29 DIAGNOSIS — E1065 Type 1 diabetes mellitus with hyperglycemia: Secondary | ICD-10-CM | POA: Diagnosis not present

## 2011-10-29 DIAGNOSIS — E1142 Type 2 diabetes mellitus with diabetic polyneuropathy: Secondary | ICD-10-CM | POA: Diagnosis not present

## 2011-10-29 DIAGNOSIS — E1049 Type 1 diabetes mellitus with other diabetic neurological complication: Secondary | ICD-10-CM

## 2011-10-29 NOTE — Progress Notes (Signed)
Subjective:    Patient ID: Brittany Rojas, female    DOB: [birth year], 66 y.o.   MRN: [id number]  HPI Pt returns for f/u of insulin-requiring DM (dx'ed 1996; complicated by TIA and peripheral sensory neuropathy).  She is still off prednisone.  no cbg record, but states cbg's are well-controlled.   Past Medical History  Diagnosis Date  . Depression   . Diabetes mellitus   . Hyperlipidemia   . Hypertension   . Diverticulitis   . History of transient ischemic attack (TIA)     Past Surgical History  Procedure Date  . Abdominal hysterectomy   . Colectomy     History   Social History  . Marital Status: Married    Spouse Name: N/A    Number of Children: N/A  . Years of Education: N/A   Occupational History  . Not on file.   Social History Main Topics  . Smoking status: Former Games developer  . Smokeless tobacco: Not on file  . Alcohol Use: Not on file  . Drug Use: Not on file  . Sexually Active: Not on file   Other Topics Concern  . Not on file   Social History Narrative  . No narrative on file    Current Outpatient Prescriptions on File Prior to Visit  Medication Sig Dispense Refill  . albuterol (PROAIR HFA) 108 (90 BASE) MCG/ACT inhaler Inhale 2 puffs into the lungs every 4 (four) hours as needed.      Marland Kitchen aspirin 81 MG EC tablet Take 81 mg by mouth daily.        Marland Kitchen atorvastatin (LIPITOR) 80 MG tablet Take 80 mg by mouth daily.        Marland Kitchen gabapentin (NEURONTIN) 300 MG capsule Take 1 capsule (300 mg total) by mouth 2 (two) times daily.  180 capsule  2  . insulin aspart (NOVOLOG) 100 UNIT/ML injection Inject subcutaneously three times daily just before each meal 12-06-23 units  60 mL  1  . insulin lispro (HUMALOG) 100 UNIT/ML injection 3x a day (just before each meal) 12-06-23 units  60 mL  3  . insulin NPH (HUMULIN N,NOVOLIN N) 100 UNIT/ML injection Inject 65 Units into the skin at bedtime.      . Insulin Pen Needle (BD ULTRA-FINE PEN NEEDLES) 29G X 12.7MM MISC by Does not apply  route 3 (three) times daily.        Marland Kitchen lisinopril (PRINIVIL,ZESTRIL) 40 MG tablet Take 40 mg by mouth daily.        Marland Kitchen losartan-hydrochlorothiazide (HYZAAR) 50-12.5 MG per tablet Take 1 tablet by mouth daily.      Marland Kitchen DISCONTD: telmisartan-hydrochlorothiazide (MICARDIS HCT) 80-12.5 MG per tablet Take 1 tablet by mouth daily.          Allergies  Allergen Reactions  . Erythromycin   . Penicillins   . Sulfonamide Derivatives   . Tetracycline     Family History  Problem Relation Age of Onset  . Diabetes Mother    BP 132/78  Pulse 92  Temp 99.1 F (37.3 C) (Oral)  Ht 5\' 3"  (1.6 m)  Wt 217 lb (98.431 kg)  BMI 38.44 kg/m2  SpO2 92%  Review of Systems denies hypoglycemia    Objective:   Physical Exam VITAL SIGNS:  See vs page GENERAL: no distress SKIN:  Insulin injection sites at the anterior abdomen are normal.    Lab Results  Component Value Date   HGBA1C 7.0* 07/18/2011      Assessment &  Plan:  DM, well-controlled

## 2011-10-29 NOTE — Patient Instructions (Addendum)
blood tests are being requested for you today.  You will receive a letter with results. pending the test results, please continue humalog, (just before each meal), 12-06-23 units , and: continue nph, 65 units at bedtime.   Please make a follow-up appointment in 4 months.   check your blood sugar 2 times a day.  vary the time of day when you check, between before the 3 meals, and at bedtime.  also check if you have symptoms of your blood sugar being too high or too low.  please keep a record of the readings and bring it to your next appointment here.  please call us sooner if your blood sugar goes below 70, or if it stays over 200.

## 2011-11-04 DIAGNOSIS — G4733 Obstructive sleep apnea (adult) (pediatric): Secondary | ICD-10-CM | POA: Diagnosis not present

## 2011-11-05 DIAGNOSIS — H4011X0 Primary open-angle glaucoma, stage unspecified: Secondary | ICD-10-CM | POA: Diagnosis not present

## 2011-11-19 DIAGNOSIS — Z23 Encounter for immunization: Secondary | ICD-10-CM | POA: Diagnosis not present

## 2011-11-19 DIAGNOSIS — H4011X0 Primary open-angle glaucoma, stage unspecified: Secondary | ICD-10-CM | POA: Diagnosis not present

## 2011-12-16 DIAGNOSIS — R0609 Other forms of dyspnea: Secondary | ICD-10-CM | POA: Diagnosis not present

## 2011-12-18 DIAGNOSIS — E782 Mixed hyperlipidemia: Secondary | ICD-10-CM | POA: Diagnosis not present

## 2012-01-15 DIAGNOSIS — R0989 Other specified symptoms and signs involving the circulatory and respiratory systems: Secondary | ICD-10-CM | POA: Diagnosis not present

## 2012-01-15 DIAGNOSIS — IMO0001 Reserved for inherently not codable concepts without codable children: Secondary | ICD-10-CM | POA: Diagnosis not present

## 2012-01-15 DIAGNOSIS — R0609 Other forms of dyspnea: Secondary | ICD-10-CM | POA: Diagnosis not present

## 2012-01-15 DIAGNOSIS — E785 Hyperlipidemia, unspecified: Secondary | ICD-10-CM | POA: Diagnosis not present

## 2012-01-15 DIAGNOSIS — G459 Transient cerebral ischemic attack, unspecified: Secondary | ICD-10-CM | POA: Diagnosis not present

## 2012-01-15 DIAGNOSIS — G4733 Obstructive sleep apnea (adult) (pediatric): Secondary | ICD-10-CM | POA: Diagnosis not present

## 2012-01-15 DIAGNOSIS — I10 Essential (primary) hypertension: Secondary | ICD-10-CM | POA: Diagnosis not present

## 2012-01-16 ENCOUNTER — Other Ambulatory Visit: Payer: Self-pay | Admitting: Endocrinology

## 2012-01-22 DIAGNOSIS — G459 Transient cerebral ischemic attack, unspecified: Secondary | ICD-10-CM | POA: Diagnosis not present

## 2012-01-22 DIAGNOSIS — R0989 Other specified symptoms and signs involving the circulatory and respiratory systems: Secondary | ICD-10-CM | POA: Diagnosis not present

## 2012-01-22 DIAGNOSIS — E785 Hyperlipidemia, unspecified: Secondary | ICD-10-CM | POA: Diagnosis not present

## 2012-01-22 DIAGNOSIS — I10 Essential (primary) hypertension: Secondary | ICD-10-CM | POA: Diagnosis not present

## 2012-01-22 DIAGNOSIS — R0609 Other forms of dyspnea: Secondary | ICD-10-CM | POA: Diagnosis not present

## 2012-02-10 DIAGNOSIS — R9430 Abnormal result of cardiovascular function study, unspecified: Secondary | ICD-10-CM | POA: Diagnosis not present

## 2012-02-10 DIAGNOSIS — I10 Essential (primary) hypertension: Secondary | ICD-10-CM | POA: Diagnosis not present

## 2012-02-10 DIAGNOSIS — R0609 Other forms of dyspnea: Secondary | ICD-10-CM | POA: Diagnosis not present

## 2012-02-10 DIAGNOSIS — E119 Type 2 diabetes mellitus without complications: Secondary | ICD-10-CM | POA: Diagnosis not present

## 2012-02-11 ENCOUNTER — Other Ambulatory Visit: Payer: Self-pay | Admitting: Interventional Cardiology

## 2012-02-13 ENCOUNTER — Encounter (HOSPITAL_BASED_OUTPATIENT_CLINIC_OR_DEPARTMENT_OTHER)
Admission: RE | Disposition: A | Discharge status: Home / Self Care | Payer: Self-pay | Source: Ambulatory Visit | Attending: Interventional Cardiology

## 2012-02-13 ENCOUNTER — Other Ambulatory Visit: Payer: Self-pay | Admitting: Interventional Cardiology

## 2012-02-13 ENCOUNTER — Inpatient Hospital Stay (HOSPITAL_BASED_OUTPATIENT_CLINIC_OR_DEPARTMENT_OTHER)
Admission: RE | Admit: 2012-02-13 | Discharge: 2012-02-13 | Disposition: A | Discharge status: Home / Self Care | Payer: Medicare Other | Source: Ambulatory Visit | Attending: Interventional Cardiology | Admitting: Interventional Cardiology

## 2012-02-13 DIAGNOSIS — R9439 Abnormal result of other cardiovascular function study: Secondary | ICD-10-CM | POA: Diagnosis not present

## 2012-02-13 DIAGNOSIS — R0989 Other specified symptoms and signs involving the circulatory and respiratory systems: Secondary | ICD-10-CM | POA: Diagnosis not present

## 2012-02-13 DIAGNOSIS — I25810 Atherosclerosis of coronary artery bypass graft(s) without angina pectoris: Secondary | ICD-10-CM | POA: Diagnosis not present

## 2012-02-13 DIAGNOSIS — R0609 Other forms of dyspnea: Secondary | ICD-10-CM | POA: Insufficient documentation

## 2012-02-13 DIAGNOSIS — I2510 Atherosclerotic heart disease of native coronary artery without angina pectoris: Secondary | ICD-10-CM | POA: Diagnosis not present

## 2012-02-13 DIAGNOSIS — E1065 Type 1 diabetes mellitus with hyperglycemia: Secondary | ICD-10-CM

## 2012-02-13 HISTORY — PX: CARDIAC CATHETERIZATION: SHX172

## 2012-02-13 LAB — POCT I-STAT GLUCOSE: Glucose, Bld: 149 mg/dL — ABNORMAL HIGH (ref 70–99)

## 2012-02-13 SURGERY — JV LEFT HEART CATHETERIZATION WITH CORONARY ANGIOGRAM
Anesthesia: Moderate Sedation

## 2012-02-13 MED ORDER — SODIUM CHLORIDE 0.9 % IJ SOLN: 3.0000 mL | INTRAMUSCULAR | Status: DC | PRN

## 2012-02-13 MED ORDER — SODIUM CHLORIDE 0.9 % IV SOLN
INTRAVENOUS | Status: DC
  Administered 2012-02-13: 10:00:00 via INTRAVENOUS

## 2012-02-13 MED ORDER — SODIUM CHLORIDE 0.9 % IJ SOLN: 3.0000 mL | Freq: Two times a day (BID) | INTRAMUSCULAR | Status: DC

## 2012-02-13 MED ORDER — DIAZEPAM 5 MG PO TABS
5.0000 mg | ORAL_TABLET | ORAL | Status: AC
  Administered 2012-02-13: 5 mg via ORAL

## 2012-02-13 MED ORDER — SODIUM CHLORIDE 0.9 % IV SOLN: 250.0000 mL | INTRAVENOUS | Status: DC | PRN

## 2012-02-13 MED ORDER — ASPIRIN 81 MG PO CHEW
324.0000 mg | CHEWABLE_TABLET | ORAL | Status: AC
  Administered 2012-02-13: 324 mg via ORAL

## 2012-02-13 NOTE — Progress Notes (Signed)
Positive Allen's test performed on right hand, spo2 92%.

## 2012-02-13 NOTE — Progress Notes (Signed)
TR band removed, tegaderm dressing applied to right radial site along with right wrist immobilizer.

## 2012-02-13 NOTE — CV Procedure (Signed)
     Diagnostic Cardiac Catheterization Report  Brittany Rojas  67 y.o.  female 05/16/1945  Procedure Date: 02/13/2012 Referring Physician: Catha Gosselin, M.D. Primary Cardiologist:: Gwynneth Albright, M.D.   PROCEDURE:  Left heart catheterization with selective coronary angiography, left ventriculogram.  INDICATIONS:  Exertional dyspnea, lateral apical ischemia on nuclear study, and risk factors for CAD.  The risks, benefits, and details of the procedure were explained to the patient.  The patient verbalized understanding and wanted to proceed.  Informed written consent was obtained.  PROCEDURE TECHNIQUE:  After Xylocaine anesthesia a 5 French sheath was placed in the right radial artery with a single anterior needle wall stick.   Coronary angiography was done using a 5 French 4 cm right Judkins and 3.5 cm left Judkins catheter.  Left ventriculography was done using a 5 [place] JR 4 by hand injection.    CONTRAST:  Total of 65 cc.  COMPLICATIONS:  None.    HEMODYNAMICS:  Aortic pressure was 120/54 mmHg; LV pressure was 128/17 mmHg; LVEDP 17 mm mercury.  There was no gradient between the left ventricle and aorta.    ANGIOGRAPHIC DATA:   The left main coronary artery is normal.  The left anterior descending artery is dominant wrapping around the left ventricular apex. The proximal vessel no the origin of the first septal perforator and diagonal contains up to 30% narrowing. No high-grade of flow obstructive lesions noted throughout the LAD territory.  The left circumflex artery is large vessel giving origin to 3 obtuse marginal branches.. The second marginals a dominant vessel with in this territory. This vessel contains a proximal to mid segmental 85% stenosis before it bifurcates on the lateral apical wall.  The right coronary artery is anterior origin with 50% ostial narrowing. The right coronary arises anteriorly/leftward from the right sinus of Valsalva. The vessel is otherwise  widely patent.  LEFT VENTRICULOGRAM:  Left ventricular angiogram was done in the 30 RAO projection and revealed normal left ventricular wall motion and systolic function with an estimated ejection fraction of 75 %.    IMPRESSIONS:  1. High-grade obstruction in the dominant obtuse marginal branch and supplies the lateral apical segment and is a likely culprit for the patient's abnormal nuclear perfusion images.  2. Less than or equal to 50% obstruction in the proximal LAD and ostium of the right coronary. The right coronary arises anteriorly/leftward in the sinus of Valsalva.  3. Hyperdynamic left ventricular contractility.   RECOMMENDATION:  1. Start Plavix  2. Consider PCI/stent of the obtuse marginal. Discussed with the patient including risks of stroke, death, myocardial infarction, emergency surgery, bleeding, emergency surgery, among others. She will consider her options per.

## 2012-02-13 NOTE — H&P (Signed)
The patient has been having dyspnea. She denies chest discomfort. The nuclear study demonstrated a region of distal anterior wall and apical ischemia. The studies being done to rule out coronary artery disease possibly as the cause of her exertional dyspnea. The procedure and the risks including stroke, death, myocardial infarction, bleeding, among others were discussed in detail and accepted by the patient.

## 2012-02-14 ENCOUNTER — Encounter (HOSPITAL_COMMUNITY): Payer: Self-pay | Admitting: Respiratory Therapy

## 2012-02-14 ENCOUNTER — Other Ambulatory Visit: Payer: Self-pay | Admitting: Endocrinology

## 2012-02-17 ENCOUNTER — Encounter (HOSPITAL_COMMUNITY)
Admission: RE | Disposition: A | Discharge status: Home / Self Care | Payer: Self-pay | Source: Ambulatory Visit | Attending: Interventional Cardiology

## 2012-02-17 ENCOUNTER — Encounter (HOSPITAL_COMMUNITY): Payer: Self-pay | Admitting: General Practice

## 2012-02-17 ENCOUNTER — Ambulatory Visit (HOSPITAL_COMMUNITY)
Admission: RE | Admit: 2012-02-17 | Discharge: 2012-02-18 | Disposition: A | Discharge status: Home / Self Care | Payer: Medicare Other | Source: Ambulatory Visit | Attending: Interventional Cardiology | Admitting: Interventional Cardiology

## 2012-02-17 DIAGNOSIS — R0609 Other forms of dyspnea: Secondary | ICD-10-CM | POA: Insufficient documentation

## 2012-02-17 DIAGNOSIS — E1065 Type 1 diabetes mellitus with hyperglycemia: Secondary | ICD-10-CM | POA: Diagnosis not present

## 2012-02-17 DIAGNOSIS — I25810 Atherosclerosis of coronary artery bypass graft(s) without angina pectoris: Secondary | ICD-10-CM

## 2012-02-17 DIAGNOSIS — E1049 Type 1 diabetes mellitus with other diabetic neurological complication: Secondary | ICD-10-CM | POA: Diagnosis not present

## 2012-02-17 DIAGNOSIS — I2510 Atherosclerotic heart disease of native coronary artery without angina pectoris: Secondary | ICD-10-CM | POA: Diagnosis not present

## 2012-02-17 DIAGNOSIS — Z955 Presence of coronary angioplasty implant and graft: Secondary | ICD-10-CM

## 2012-02-17 DIAGNOSIS — R0989 Other specified symptoms and signs involving the circulatory and respiratory systems: Secondary | ICD-10-CM | POA: Insufficient documentation

## 2012-02-17 DIAGNOSIS — R9439 Abnormal result of other cardiovascular function study: Secondary | ICD-10-CM

## 2012-02-17 HISTORY — DX: Pneumonia, unspecified organism: J18.9

## 2012-02-17 HISTORY — DX: Type 2 diabetes mellitus without complications: E11.9

## 2012-02-17 HISTORY — PX: PERCUTANEOUS CORONARY STENT INTERVENTION (PCI-S): SHX5485

## 2012-02-17 HISTORY — DX: Migraine, unspecified, not intractable, without status migrainosus: G43.909

## 2012-02-17 HISTORY — DX: Dependence on other enabling machines and devices: Z99.89

## 2012-02-17 HISTORY — PX: CORONARY ANGIOPLASTY WITH STENT PLACEMENT: SHX49

## 2012-02-17 HISTORY — DX: Bronchitis, not specified as acute or chronic: J40

## 2012-02-17 HISTORY — DX: Personal history of other medical treatment: Z92.89

## 2012-02-17 HISTORY — DX: Shortness of breath: R06.02

## 2012-02-17 HISTORY — DX: Obstructive sleep apnea (adult) (pediatric): G47.33

## 2012-02-17 LAB — GLUCOSE, CAPILLARY
Glucose-Capillary: 216 mg/dL — ABNORMAL HIGH (ref 70–99)
Glucose-Capillary: 156 mg/dL — ABNORMAL HIGH (ref 70–99)
Glucose-Capillary: 100 mg/dL — ABNORMAL HIGH (ref 70–99)

## 2012-02-17 LAB — POCT I-STAT, CHEM 8
Calcium, Ion: 1.35 mmol/L — ABNORMAL HIGH (ref 1.13–1.30)
Chloride: 104 mEq/L (ref 96–112)
Glucose, Bld: 195 mg/dL — ABNORMAL HIGH (ref 70–99)
HCT: 35 % — ABNORMAL LOW (ref 36.0–46.0)
Hemoglobin: 11.9 g/dL — ABNORMAL LOW (ref 12.0–15.0)
Potassium: 4.3 mEq/L (ref 3.5–5.1)

## 2012-02-17 SURGERY — PERCUTANEOUS CORONARY STENT INTERVENTION (PCI-S)
Anesthesia: LOCAL

## 2012-02-17 MED ORDER — SODIUM CHLORIDE 0.9 % IV SOLN: 250.0000 mL | INTRAVENOUS | Status: DC | PRN

## 2012-02-17 MED ORDER — SODIUM CHLORIDE 0.9 % IJ SOLN: 3.0000 mL | INTRAMUSCULAR | Status: DC | PRN

## 2012-02-17 MED ORDER — INSULIN ASPART 100 UNIT/ML ~~LOC~~ SOLN: 17.0000 [IU] | Freq: Three times a day (TID) | SUBCUTANEOUS | Status: DC

## 2012-02-17 MED ORDER — SODIUM CHLORIDE 0.9 % IV SOLN
INTRAVENOUS | Status: DC
  Administered 2012-02-17: 08:00:00 via INTRAVENOUS

## 2012-02-17 MED ORDER — ATORVASTATIN CALCIUM 10 MG PO TABS
10.0000 mg | ORAL_TABLET | Freq: Every day | ORAL | Status: DC
  Filled 2012-02-17: qty 1

## 2012-02-17 MED ORDER — HEPARIN (PORCINE) IN NACL 2-0.9 UNIT/ML-% IJ SOLN
INTRAMUSCULAR | Status: AC
  Filled 2012-02-17: qty 1000

## 2012-02-17 MED ORDER — ALBUTEROL SULFATE HFA 108 (90 BASE) MCG/ACT IN AERS
2.0000 | INHALATION_SPRAY | RESPIRATORY_TRACT | Status: DC | PRN
  Filled 2012-02-17: qty 6.7

## 2012-02-17 MED ORDER — MIDAZOLAM HCL 2 MG/2ML IJ SOLN
INTRAMUSCULAR | Status: AC
  Filled 2012-02-17: qty 2

## 2012-02-17 MED ORDER — ADULT MULTIVITAMIN W/MINERALS CH
1.0000 | ORAL_TABLET | Freq: Every day | ORAL | Status: DC
  Administered 2012-02-18: 1 via ORAL
  Filled 2012-02-17: qty 1

## 2012-02-17 MED ORDER — INSULIN ASPART 100 UNIT/ML ~~LOC~~ SOLN
17.0000 [IU] | Freq: Every day | SUBCUTANEOUS | Status: DC
  Administered 2012-02-18: 17 [IU] via SUBCUTANEOUS

## 2012-02-17 MED ORDER — CLOPIDOGREL BISULFATE 75 MG PO TABS
75.0000 mg | ORAL_TABLET | Freq: Every day | ORAL | Status: DC
  Administered 2012-02-18: 75 mg via ORAL
  Filled 2012-02-17: qty 1

## 2012-02-17 MED ORDER — HYDROCHLOROTHIAZIDE 12.5 MG PO CAPS
12.5000 mg | ORAL_CAPSULE | Freq: Every day | ORAL | Status: DC
  Administered 2012-02-18: 12.5 mg via ORAL
  Filled 2012-02-17: qty 1

## 2012-02-17 MED ORDER — INSULIN NPH (HUMAN) (ISOPHANE) 100 UNIT/ML ~~LOC~~ SUSP
62.0000 [IU] | Freq: Every day | SUBCUTANEOUS | Status: DC
  Administered 2012-02-17: 62 [IU] via SUBCUTANEOUS
  Filled 2012-02-17 (×2): qty 10

## 2012-02-17 MED ORDER — LOSARTAN POTASSIUM 50 MG PO TABS
50.0000 mg | ORAL_TABLET | Freq: Every day | ORAL | Status: DC
  Administered 2012-02-18: 50 mg via ORAL
  Filled 2012-02-17: qty 1

## 2012-02-17 MED ORDER — INSULIN ASPART 100 UNIT/ML ~~LOC~~ SOLN
27.0000 [IU] | Freq: Two times a day (BID) | SUBCUTANEOUS | Status: DC
  Administered 2012-02-17 (×2): 27 [IU] via SUBCUTANEOUS

## 2012-02-17 MED ORDER — NITROGLYCERIN 0.4 MG SL SUBL: 0.4000 mg | SUBLINGUAL_TABLET | SUBLINGUAL | Status: AC | PRN

## 2012-02-17 MED ORDER — NITROGLYCERIN 0.2 MG/ML ON CALL CATH LAB
INTRAVENOUS | Status: AC
  Filled 2012-02-17: qty 1

## 2012-02-17 MED ORDER — ASPIRIN 325 MG PO TABS: 325.0000 mg | ORAL_TABLET | Freq: Every day | ORAL | Status: DC

## 2012-02-17 MED ORDER — SODIUM CHLORIDE 0.9 % IV SOLN: INTRAVENOUS | Status: AC

## 2012-02-17 MED ORDER — GABAPENTIN 300 MG PO CAPS
300.0000 mg | ORAL_CAPSULE | Freq: Two times a day (BID) | ORAL | Status: DC
  Administered 2012-02-17 – 2012-02-18 (×2): 300 mg via ORAL
  Filled 2012-02-17 (×4): qty 1

## 2012-02-17 MED ORDER — NITROGLYCERIN 0.4 MG SL SUBL: 0.4000 mg | SUBLINGUAL_TABLET | SUBLINGUAL | Status: DC | PRN

## 2012-02-17 MED ORDER — DIAZEPAM 5 MG PO TABS
5.0000 mg | ORAL_TABLET | ORAL | Status: AC
  Administered 2012-02-17: 5 mg via ORAL
  Filled 2012-02-17: qty 1

## 2012-02-17 MED ORDER — ASPIRIN 325 MG PO TABS
325.0000 mg | ORAL_TABLET | Freq: Every day | ORAL | Status: DC
  Administered 2012-02-18: 325 mg via ORAL
  Filled 2012-02-17: qty 1

## 2012-02-17 MED ORDER — ACETAMINOPHEN 325 MG PO TABS: 650.0000 mg | ORAL_TABLET | ORAL | Status: DC | PRN

## 2012-02-17 MED ORDER — CLOPIDOGREL BISULFATE 300 MG PO TABS
ORAL_TABLET | ORAL | Status: AC
  Filled 2012-02-17: qty 1

## 2012-02-17 MED ORDER — OXYCODONE-ACETAMINOPHEN 5-325 MG PO TABS: 1.0000 | ORAL_TABLET | ORAL | Status: DC | PRN

## 2012-02-17 MED ORDER — FENTANYL CITRATE 0.05 MG/ML IJ SOLN
INTRAMUSCULAR | Status: AC
  Filled 2012-02-17: qty 2

## 2012-02-17 MED ORDER — SODIUM CHLORIDE 0.9 % IJ SOLN: 3.0000 mL | Freq: Two times a day (BID) | INTRAMUSCULAR | Status: DC

## 2012-02-17 MED ORDER — ONDANSETRON HCL 4 MG/2ML IJ SOLN: 4.0000 mg | Freq: Four times a day (QID) | INTRAMUSCULAR | Status: DC | PRN

## 2012-02-17 MED ORDER — SODIUM CHLORIDE 0.9 % IV SOLN
0.2500 mg/kg/h | INTRAVENOUS | Status: DC
  Filled 2012-02-17: qty 250

## 2012-02-17 MED ORDER — LIDOCAINE HCL (PF) 1 % IJ SOLN
INTRAMUSCULAR | Status: AC
  Filled 2012-02-17: qty 30

## 2012-02-17 MED ORDER — SPIRONOLACTONE 25 MG PO TABS
25.0000 mg | ORAL_TABLET | Freq: Every day | ORAL | Status: DC
  Administered 2012-02-18: 25 mg via ORAL
  Filled 2012-02-17: qty 1

## 2012-02-17 MED ORDER — LOSARTAN POTASSIUM-HCTZ 50-12.5 MG PO TABS: 1.0000 | ORAL_TABLET | Freq: Every day | ORAL | Status: DC

## 2012-02-17 MED ORDER — VERAPAMIL HCL 2.5 MG/ML IV SOLN
INTRAVENOUS | Status: AC
  Filled 2012-02-17: qty 2

## 2012-02-17 MED ORDER — ASPIRIN 81 MG PO CHEW
324.0000 mg | CHEWABLE_TABLET | ORAL | Status: AC
  Administered 2012-02-17: 324 mg via ORAL
  Filled 2012-02-17: qty 4

## 2012-02-17 NOTE — Progress Notes (Signed)
TR BAND REMOVAL  LOCATION:    right radial  DEFLATED PER PROTOCOL:    yes  TIME BAND OFF / DRESSING APPLIED:    1345   SITE UPON ARRIVAL:    Level 1  SITE AFTER BAND REMOVAL:    Level 1  REVERSE ALLEN'S TEST:     positive  CIRCULATION SENSATION AND MOVEMENT:    Within Normal Limits   yes  COMMENTS:   Arrived on unit at 1140 with bruising noted proximal and distal to band, no hematoma, bleeding, swelling noted. Positive reverse allen's, CSM's wnls. Assessment remains unchanged from admission assessment. Level 1 assessment for bruising only, area remains soft. Gauze dressing dry and intact with unchanged assessment of level 1, positive reverse allen's and normal CSM's at 1415 when reassessed.

## 2012-02-17 NOTE — Progress Notes (Signed)
Utilization Review Completed.   Ah Bott, RN, BSN Nurse Case Manager  [phone]  

## 2012-02-17 NOTE — Progress Notes (Signed)
[id number] Cardiac Rehab Completed brief stent discharge education with pt and husband. They voice understanding. Pt agrees to Outpt. CRP in [place], will send referral.

## 2012-02-17 NOTE — H&P (Signed)
Alize has apical ischemia on nuclear scintigraphy which correlates with tight disease in a large first obtuse marginal that supplies the lateral apical segment . Her symptoms have included exertional dyspnea. She has not had chest pain. Diagnostic cath demonstrated an 85% focal stenosis. The plan is for PCI of the obtuse marginal branch. The procedure and its risks were discussed with the patient in detail. The procedural risks described to her death, stroke, myocardial infarction, bleeding, limb ischemia, emergency surgery, vessel perforation, among others. These risks were accepted by the patient and she consented to proceed.

## 2012-02-17 NOTE — Discharge Summary (Addendum)
Patient ID: Brittany Rojas MRN: [id number] DOB/AGE: 69-26-1947 69 y.o.  Admit date: 02/17/2012 Discharge date: 02/17/2012 Patient Active Problem List   Diagnosis Date Noted  . CAD (coronary artery disease) of artery bypass graft 02/13/2012    Priority: High    Class: Acute  . Abnormal nuclear stress test 02/13/2012    Priority: Medium  . Sleep apnea 10/29/2011  . Type I (juvenile type) diabetes mellitus with neurological manifestations, uncontrolled 07/18/2011  . NEUROPATHY 06/14/2008  . HYPERLIPIDEMIA 04/29/2007  . EXOGENOUS OBESITY 04/29/2007  . DEPRESSION 04/29/2007  . HYPERTENSION 04/29/2007  . PERIPHERAL EDEMA 04/29/2007  . TRANSIENT ISCHEMIC ATTACK, HX OF 04/29/2007  . DIVERTICULITIS, HX OF 04/29/2007   Primary Discharge Diagnosis: CAD with abnormal nuclear study and dyspnea Secondary Discharge Diagnosis: 1. Hypertension  2. Hyperlipidemia  3. Obesity  4. TIA  5. Neuropathy  6. Diabetes mellitus  Significant Diagnostic Studies: DES implantation proximal obtuse marginal #1  Consults: None  Hospital Course:  Please see the procedure note. The patient had no difficulty post procedure which was done from the radial approach. She was actually prepared to be discharged the same day but became concerned because she lives [place], greater than 40 minutes away. She has ambulated without difficulty and has no symptoms.   Discharge Exam: Blood pressure 128/56, pulse 72, temperature 98.2 F (36.8 C), temperature source Oral, resp. rate 21, height 5\' 3"  (1.6 m), weight 95.255 kg (210 lb), SpO2 93.00%.   Cath site is unremarkable Labs:   Lab Results  Component Value Date   WBC 11.4* 03/28/2008   HGB 11.9* 02/17/2012   HCT 35.0* 02/17/2012   MCV 93.9 03/28/2008   PLT 344 03/28/2008    Lab 02/17/12 0751  NA 140  K 4.3  CL 104  CO2 --  BUN 22  CREATININE 0.80  CALCIUM --  PROT --  BILITOT --  ALKPHOS --  ALT --  AST --  GLUCOSE 195*    Results for Brittany Rojas, Brittany Rojas (MRN [id number]) as of 02/18/2012 08:07  Ref. Range 02/17/2012 15:47  Platelet Function  P2Y12 Latest Range: 194-418 PRU 139 (L)     Radiology: No new data EKG: Normal  FOLLOW UP PLANS AND APPOINTMENTS Discharge Orders    Future Appointments: Provider: Department: Dept Phone: Center:   02/25/2012 3:30 PM Romero Belling, MD [facility] PRIMARY [facility] ENDOCRINOLOGY [phone] None     Future Orders Please Complete By Expires   Amb Referral to Cardiac Rehabilitation      Comments:   Pt agrees to Outpt. CRP in [place], will send referral.       Medication List     As of 02/17/2012  5:05 PM    STOP taking these medications         aspirin 81 MG EC tablet      spironolactone 25 MG tablet   Commonly known as: ALDACTONE      TAKE these medications         aspirin 325 MG tablet   Take 1 tablet (325 mg total) by mouth daily.      atorvastatin 10 MG tablet   Commonly known as: LIPITOR   Take 10 mg by mouth daily.      BD ULTRA-FINE PEN NEEDLES 29G X 12.7MM Misc   Generic drug: Insulin Pen Needle   by Does not apply route 3 (three) times daily.      clopidogrel 75 MG tablet   Commonly known as: PLAVIX  Take 75 mg by mouth daily.      gabapentin 300 MG capsule   Commonly known as: NEURONTIN   Take 1 capsule (300 mg total) by mouth 2 (two) times daily.      insulin aspart 100 UNIT/ML injection   Commonly known as: novoLOG   Inject 17-27 Units into the skin 3 (three) times daily before meals. Take 17 units at breakfast and 27 units at lunch and dinner      insulin NPH 100 UNIT/ML injection   Commonly known as: HUMULIN N,NOVOLIN N   Inject 62 Units into the skin at bedtime.      losartan-hydrochlorothiazide 50-12.5 MG per tablet   Commonly known as: HYZAAR   Take 1 tablet by mouth daily.      multivitamin with minerals Tabs   Take 1 tablet by mouth daily.      nitroGLYCERIN 0.4 MG SL tablet   Commonly known as: NITROSTAT   Place 1 tablet (0.4 mg total) under the  tongue every 5 (five) minutes as needed for chest pain.      PROAIR HFA 108 (90 BASE) MCG/ACT inhaler   Generic drug: albuterol   Inhale 2 puffs into the lungs every 4 (four) hours as needed. For shortness of breath           Follow-up Information    Follow up with Lesleigh Noe, MD. On 02/25/2012. (1:30)    Contact information:   [address] [place] [address] [phone]          BRING ALL MEDICATIONS WITH YOU TO FOLLOW UP APPOINTMENTS  Time spent with patient to include physician time: Less than 30 minutes  Signed: Lesleigh Noe 02/17/2012, 5:05 PM

## 2012-02-17 NOTE — CV Procedure (Signed)
   PERCUTANEOUS CORONARY INTERVENTION   Brittany Rojas is a 67 y.o. female  INDICATION: Dyspnea and 85% stenosis and a large lateral and apical distribution obtuse marginal. Nuclear testing demonstrated inferoapical ischemia.   PROCEDURE: DES implantation OM 1   CONSENT: The risks, benefits, and details of the procedure were explained to the patient. Risks including death, MI, stroke, bleeding, limb ischemia, renal failure and allergy were described and accepted by the patient.  Informed written consent was obtained prior to proceeding.  PROCEDURE TECHNIQUE:  After Xylocaine anesthesia a 6 French sheath was placed in the right radial artery with an anterior needle wall stick after multiple passes.   Coronary guiding shots were made using a 3 cm XB LAD catheter. Antithrombotic therapy, bivalirudin bolus and infusion, was begun and determined to be therapeutic by ACT. Antiplatelet therapy, 300 mg of Plavix, was loaded on top of 75 mg taken daily for the last 5 days.  A Prowater wire was inserted and advanced beyond the stenosis in the obtuse marginal. A 2.5 x 8 mm long trek balloon was used for predilatation. We then positioned and deployed a 2.75 x 13 mm long Promus Premiere drug-eluting stent at 13 atmospheres. Postdilatation was then performed using a 9 mm long by 3.0 mm diameter [place] sprinter. 13 atmospheres was used as a deployment pressure.  CONTRAST:  Total of 135 cc.  COMPLICATIONS:  None.    ANGIOGRAPHIC RESULTS:   Blood pressure was 141/56 mmHg  The 85% relatively focal somewhat eccentric stenosis was reduced to 0% with TIMI grade 3 flow after DES implantation as noted above the   IMPRESSIONS:  Successful DES implant in the proximal obtuse marginal #1 reducing an 85% stenosis to 0% with TIMI-3 flow   RECOMMENDATION:  Potential discharge this p.m. if no.    Lesleigh Noe, MD 02/17/2012 10:41 AM

## 2012-02-17 NOTE — Plan of Care (Signed)
Problem: Consults Goal: PCI Patient Education (See Patient Education module for education specifics.) Outcome: Completed/Met Date Met:  02/17/12 Reviewed information on PCI including,  DES and tissue growth on stent taking a year, need for plavix or platelet inhibitor with aspirin to decrease risk of blood clots in stent. Given stentplus book and reviewed information in book and about stent card. Patient and family verbalized understanding after questions answered.   Problem: Phase III Progression Outcomes Goal: No angina with increased activity Outcome: Completed/Met Date Met:  02/17/12 No angina with patient activity, up in room. Goal: Tolerating diet Outcome: Completed/Met Date Met:  02/17/12 Lunch tolerated  Goal: Discharge plan remains appropriate-arrangements made Outcome: Completed/Met Date Met:  02/17/12 Planned discharge later today if remains stable Goal: Vital signs stable Outcome: Completed/Met Date Met:  02/17/12 Vital signs stable Goal: Vascular site scale level 0 - I Vascular Site Scale Level 0: No bruising/bleeding/hematoma Level I (Mild): Bruising/Ecchymosis, minimal bleeding/ooozing, palpable hematoma < 3 cm Level II (Moderate): Bleeding not affecting hemodynamic parameters, pseudoaneurysm, palpable hematoma > 3 cm Outcome: Completed/Met Date Met:  02/17/12 Vascular site, right radial, level 1 bruised proximal and distal to band on admission to unit. Goal: Cardiac Rehab if ordered Outcome: Completed/Met Date Met:  02/17/12 Cardiac rehab into see patient and review information (see documentation)

## 2012-02-18 DIAGNOSIS — I25810 Atherosclerosis of coronary artery bypass graft(s) without angina pectoris: Secondary | ICD-10-CM | POA: Diagnosis not present

## 2012-02-18 DIAGNOSIS — R9439 Abnormal result of other cardiovascular function study: Secondary | ICD-10-CM | POA: Diagnosis not present

## 2012-02-18 DIAGNOSIS — E1049 Type 1 diabetes mellitus with other diabetic neurological complication: Secondary | ICD-10-CM | POA: Diagnosis not present

## 2012-02-18 DIAGNOSIS — E1065 Type 1 diabetes mellitus with hyperglycemia: Secondary | ICD-10-CM | POA: Diagnosis not present

## 2012-02-18 LAB — GLUCOSE, CAPILLARY: Glucose-Capillary: 153 mg/dL — ABNORMAL HIGH (ref 70–99)

## 2012-02-18 NOTE — Progress Notes (Signed)
CARDIAC REHAB PHASE I   PRE:  Rate/Rhythm: 101 ST  BP:  Supine:   Sitting: 100/60  Standing:    SaO2: 98 RA  MODE:  Ambulation: 550 ft   POST:  Rate/Rhythem: 113  BP:  Supine:   Sitting: 108/50  Standing:    SaO2: 100 RA [id number]  Assisted X 1 with hand held assist to ambulate. Gait steady. Pt able to walk 550 feet without c/o of cp or SOB. Pt states that her SOB is better and her hip is not hurting with walking. VS stable. Pt back to side of bed after walk. Reviewed and expanded with pt discharge education that we discussed yesterday. She voices understanding.Pt is excited how much better she is feeling.  Brittany Rojas

## 2012-02-20 DIAGNOSIS — D235 Other benign neoplasm of skin of trunk: Secondary | ICD-10-CM | POA: Diagnosis not present

## 2012-02-20 DIAGNOSIS — L820 Inflamed seborrheic keratosis: Secondary | ICD-10-CM | POA: Diagnosis not present

## 2012-02-25 ENCOUNTER — Ambulatory Visit: Payer: Medicare Other | Admitting: Endocrinology

## 2012-02-25 DIAGNOSIS — I2510 Atherosclerotic heart disease of native coronary artery without angina pectoris: Secondary | ICD-10-CM | POA: Diagnosis not present

## 2012-02-25 DIAGNOSIS — I10 Essential (primary) hypertension: Secondary | ICD-10-CM | POA: Diagnosis not present

## 2012-02-25 DIAGNOSIS — Z48812 Encounter for surgical aftercare following surgery on the circulatory system: Secondary | ICD-10-CM | POA: Diagnosis not present

## 2012-02-25 DIAGNOSIS — E782 Mixed hyperlipidemia: Secondary | ICD-10-CM | POA: Diagnosis not present

## 2012-02-28 ENCOUNTER — Ambulatory Visit (INDEPENDENT_AMBULATORY_CARE_PROVIDER_SITE_OTHER): Payer: Medicare Other | Admitting: Endocrinology

## 2012-02-28 VITALS — BP 126/74 | HR 98 | Temp 97.8°F | Wt 214.0 lb

## 2012-02-28 DIAGNOSIS — E119 Type 2 diabetes mellitus without complications: Secondary | ICD-10-CM | POA: Diagnosis not present

## 2012-02-28 DIAGNOSIS — E1049 Type 1 diabetes mellitus with other diabetic neurological complication: Secondary | ICD-10-CM | POA: Diagnosis not present

## 2012-02-28 DIAGNOSIS — E1065 Type 1 diabetes mellitus with hyperglycemia: Secondary | ICD-10-CM

## 2012-02-28 LAB — HEMOGLOBIN A1C: Hgb A1c MFr Bld: 7.9 % — ABNORMAL HIGH (ref 4.6–6.5)

## 2012-02-28 NOTE — Patient Instructions (Addendum)
blood tests are being requested for you today.  We'll contact you with results.   pending the test results, please continue humalog, (just before each meal), 12-06-23 units , and: continue nph, 65 units at bedtime.   Please make a follow-up appointment in 4 months.  check your blood sugar 2 times a day.  vary the time of day when you check, between before the 3 meals, and at bedtime.  also check if you have symptoms of your blood sugar being too high or too low.  please keep a record of the readings and bring it to your next appointment here.  please call us sooner if your blood sugar goes below 70, or if it stays over 200.

## 2012-02-28 NOTE — Progress Notes (Signed)
Subjective:    Patient ID: Brittany Rojas, female    DOB: 1945/10/16, 67 y.o.   MRN: [id number]  HPI Pt returns for f/u of insulin-requiring DM (dx'ed 1996; complicated by TIA, CAD, and peripheral sensory neuropathy).   pt states she feels well in general.  no cbg record, but states cbg's are well-controlled.  She says cbg's are highest in am, and lowest in the afternoon. Past Medical History  Diagnosis Date  . Depression   . Hyperlipidemia   . Hypertension   . Diverticulitis   . History of transient ischemic attack (TIA) 2001    "possibly; never really pinned down what it was" (02/17/2012)  . Pneumonia 12/2010  . Bronchitis     "touch q now and then" (02/17/2012)  . Shortness of breath     "occasionally; at any time" (02/17/2012)  . OSA on CPAP   . Type II diabetes mellitus   . History of blood transfusion 1945    "when I was 45 days old" (02/17/2012)  . Migraines     "outgrew them I guess" (02/17/2012)    Past Surgical History  Procedure Date  . Cardiac catheterization 02/13/2012  . Coronary angioplasty with stent placement 02/17/2012    "1; first one ever" (02/17/2012)  . Left oophorectomy 1989  . Colostomy 01/18/1988  . Colostomy reversal 04/1988  . Appendectomy 04/1988  . Colectomy 01/18/1988    "took 3 feet out; result of lots of burst diverticula" (02/17/2012)  . Colectomy 04/1988    "9 more inches of colon" (02/17/2012)  . Abdominal hysterectomy 2000's    History   Social History  . Marital Status: Married    Spouse Name: N/A    Number of Children: N/A  . Years of Education: N/A   Occupational History  . Not on file.   Social History Main Topics  . Smoking status: Former Smoker -- 2 years    Types: Cigarettes    Quit date: 11/11/1969  . Smokeless tobacco: Never Used  . Alcohol Use: No  . Drug Use: No  . Sexually Active: Not Currently   Other Topics Concern  . Not on file   Social History Narrative  . No narrative on file    Current Outpatient Prescriptions on File  Prior to Visit  Medication Sig Dispense Refill  . albuterol (PROAIR HFA) 108 (90 BASE) MCG/ACT inhaler Inhale 2 puffs into the lungs every 4 (four) hours as needed. For shortness of breath      . aspirin 325 MG tablet Take 1 tablet (325 mg total) by mouth daily.      Marland Kitchen atorvastatin (LIPITOR) 10 MG tablet Take 10 mg by mouth daily.      . clopidogrel (PLAVIX) 75 MG tablet Take 75 mg by mouth daily.      Marland Kitchen gabapentin (NEURONTIN) 300 MG capsule Take 1 capsule (300 mg total) by mouth 2 (two) times daily.  180 capsule  2  . insulin aspart (NOVOLOG) 100 UNIT/ML injection Inject 17-27 Units into the skin 3 (three) times daily before meals. Take 17 units at breakfast and 27 units at lunch and dinner      . insulin NPH (HUMULIN N,NOVOLIN N) 100 UNIT/ML injection Inject 70 Units into the skin at bedtime.       . Insulin Pen Needle (BD ULTRA-FINE PEN NEEDLES) 29G X 12.7MM MISC by Does not apply route 3 (three) times daily.        Marland Kitchen losartan-hydrochlorothiazide (HYZAAR) 50-12.5 MG per tablet  Take 1 tablet by mouth daily.      . Multiple Vitamin (MULTIVITAMIN WITH MINERALS) TABS Take 1 tablet by mouth daily.      . nitroGLYCERIN (NITROSTAT) 0.4 MG SL tablet Place 1 tablet (0.4 mg total) under the tongue every 5 (five) minutes as needed for chest pain.  25 tablet  3  . [DISCONTINUED] telmisartan-hydrochlorothiazide (MICARDIS HCT) 80-12.5 MG per tablet Take 1 tablet by mouth daily.          Allergies  Allergen Reactions  . Erythromycin Other (See Comments)    "upset stomach" (02/17/2012)  . Penicillins Other (See Comments)    ">50 yr ago, I had strept throat; had gotten lots and lots of PCN; last dose caused me to stop breathing" (02/17/2012)  . Tetracycline Other (See Comments)    "upset stomach" (02/17/2012)  . Sulfonamide Derivatives Rash and Other (See Comments)    "upset stomach" (02/17/2012)    Family History  Problem Relation Age of Onset  . Diabetes Mother     BP 126/74  Pulse 98  Temp 97.8 F  (36.6 C) (Oral)  Wt 214 lb (97.07 kg)  SpO2 96%  Review of Systems denies hypoglycemia    Objective:   Physical Exam VITAL SIGNS:  See vs page GENERAL: no distress Pulses: dorsalis pedis intact bilat.   Feet: no deformity.  no ulcer on the feet.  feet are of normal color and temp.  Trace bilat leg edema.  There is bilateral onychomycosis.  Neuro: sensation is intact to touch on the feet.     Lab Results  Component Value Date   HGBA1C 7.9* 02/28/2012      Assessment & Plan:  DM: needs increased rx

## 2012-03-17 DIAGNOSIS — Z9861 Coronary angioplasty status: Secondary | ICD-10-CM | POA: Diagnosis not present

## 2012-03-17 DIAGNOSIS — Z5189 Encounter for other specified aftercare: Secondary | ICD-10-CM | POA: Diagnosis not present

## 2012-03-19 ENCOUNTER — Other Ambulatory Visit: Payer: Self-pay | Admitting: Endocrinology

## 2012-03-24 ENCOUNTER — Other Ambulatory Visit: Payer: Self-pay | Admitting: Endocrinology

## 2012-03-24 MED ORDER — INSULIN ASPART 100 UNIT/ML ~~LOC~~ SOLN: SUBCUTANEOUS | Status: DC

## 2012-03-31 ENCOUNTER — Other Ambulatory Visit: Payer: Self-pay | Admitting: *Deleted

## 2012-03-31 ENCOUNTER — Telehealth: Payer: Self-pay | Admitting: Endocrinology

## 2012-03-31 NOTE — Telephone Encounter (Signed)
i did form today

## 2012-03-31 NOTE — Telephone Encounter (Signed)
Pt needs refill on insulin. Says her pharmacy has submitted request but our office has not responded. Please call / Sherri S.

## 2012-03-31 NOTE — Telephone Encounter (Signed)
rx faxed to [facility].

## 2012-04-13 DIAGNOSIS — Z9861 Coronary angioplasty status: Secondary | ICD-10-CM | POA: Diagnosis not present

## 2012-04-13 DIAGNOSIS — Z5189 Encounter for other specified aftercare: Secondary | ICD-10-CM | POA: Diagnosis not present

## 2012-04-14 DIAGNOSIS — S8990XA Unspecified injury of unspecified lower leg, initial encounter: Secondary | ICD-10-CM | POA: Diagnosis not present

## 2012-04-28 DIAGNOSIS — E119 Type 2 diabetes mellitus without complications: Secondary | ICD-10-CM | POA: Diagnosis not present

## 2012-04-28 DIAGNOSIS — H4011X0 Primary open-angle glaucoma, stage unspecified: Secondary | ICD-10-CM | POA: Diagnosis not present

## 2012-05-12 DIAGNOSIS — B9789 Other viral agents as the cause of diseases classified elsewhere: Secondary | ICD-10-CM | POA: Diagnosis not present

## 2012-05-15 DIAGNOSIS — Z9861 Coronary angioplasty status: Secondary | ICD-10-CM | POA: Diagnosis not present

## 2012-05-15 DIAGNOSIS — Z5189 Encounter for other specified aftercare: Secondary | ICD-10-CM | POA: Diagnosis not present

## 2012-05-19 DIAGNOSIS — E785 Hyperlipidemia, unspecified: Secondary | ICD-10-CM | POA: Diagnosis not present

## 2012-05-19 DIAGNOSIS — E669 Obesity, unspecified: Secondary | ICD-10-CM | POA: Diagnosis not present

## 2012-05-19 DIAGNOSIS — I2510 Atherosclerotic heart disease of native coronary artery without angina pectoris: Secondary | ICD-10-CM | POA: Diagnosis not present

## 2012-05-19 DIAGNOSIS — I10 Essential (primary) hypertension: Secondary | ICD-10-CM | POA: Diagnosis not present

## 2012-06-02 DIAGNOSIS — Z1231 Encounter for screening mammogram for malignant neoplasm of breast: Secondary | ICD-10-CM | POA: Diagnosis not present

## 2012-06-08 ENCOUNTER — Other Ambulatory Visit: Payer: Self-pay | Admitting: *Deleted

## 2012-06-08 MED ORDER — GABAPENTIN 300 MG PO CAPS: 300.0000 mg | ORAL_CAPSULE | Freq: Two times a day (BID) | ORAL | Status: DC

## 2012-06-15 DIAGNOSIS — Z9861 Coronary angioplasty status: Secondary | ICD-10-CM | POA: Diagnosis not present

## 2012-06-15 DIAGNOSIS — Z5189 Encounter for other specified aftercare: Secondary | ICD-10-CM | POA: Diagnosis not present

## 2012-06-19 DIAGNOSIS — Z5189 Encounter for other specified aftercare: Secondary | ICD-10-CM | POA: Diagnosis not present

## 2012-06-19 DIAGNOSIS — Z9861 Coronary angioplasty status: Secondary | ICD-10-CM | POA: Diagnosis not present

## 2012-06-22 DIAGNOSIS — Z9861 Coronary angioplasty status: Secondary | ICD-10-CM | POA: Diagnosis not present

## 2012-06-22 DIAGNOSIS — Z5189 Encounter for other specified aftercare: Secondary | ICD-10-CM | POA: Diagnosis not present

## 2012-06-24 DIAGNOSIS — Z5189 Encounter for other specified aftercare: Secondary | ICD-10-CM | POA: Diagnosis not present

## 2012-06-24 DIAGNOSIS — Z9861 Coronary angioplasty status: Secondary | ICD-10-CM | POA: Diagnosis not present

## 2012-06-26 DIAGNOSIS — Z9861 Coronary angioplasty status: Secondary | ICD-10-CM | POA: Diagnosis not present

## 2012-06-26 DIAGNOSIS — Z5189 Encounter for other specified aftercare: Secondary | ICD-10-CM | POA: Diagnosis not present

## 2012-06-29 ENCOUNTER — Encounter: Payer: Self-pay | Admitting: Endocrinology

## 2012-06-29 ENCOUNTER — Ambulatory Visit (INDEPENDENT_AMBULATORY_CARE_PROVIDER_SITE_OTHER): Payer: Medicare Other | Admitting: Endocrinology

## 2012-06-29 VITALS — BP 118/80 | HR 83 | Ht 63.0 in | Wt 216.0 lb

## 2012-06-29 DIAGNOSIS — E119 Type 2 diabetes mellitus without complications: Secondary | ICD-10-CM | POA: Diagnosis not present

## 2012-06-29 DIAGNOSIS — E1065 Type 1 diabetes mellitus with hyperglycemia: Secondary | ICD-10-CM | POA: Diagnosis not present

## 2012-06-29 DIAGNOSIS — Z9861 Coronary angioplasty status: Secondary | ICD-10-CM | POA: Diagnosis not present

## 2012-06-29 DIAGNOSIS — E1049 Type 1 diabetes mellitus with other diabetic neurological complication: Secondary | ICD-10-CM

## 2012-06-29 DIAGNOSIS — Z5189 Encounter for other specified aftercare: Secondary | ICD-10-CM | POA: Diagnosis not present

## 2012-06-29 LAB — HEMOGLOBIN A1C: Hgb A1c MFr Bld: 7.4 % — ABNORMAL HIGH (ref 4.6–6.5)

## 2012-06-29 NOTE — Patient Instructions (Addendum)
Try changing the gabapentin to 2 pills at night. blood tests are being requested for you today.  We'll contact you with results.   pending the test results, please continue humalog, (just before each meal), 17-27-27 units.  If you eat at bedtime, take an extra 5 units for it. reduce nph to 60 units at bedtime.   Please make a follow-up appointment in 4 months.   check your blood sugar 2 times a day.  vary the time of day when you check, between before the 3 meals, and at bedtime.  also check if you have symptoms of your blood sugar being too high or too low.  please keep a record of the readings and bring it to your next appointment here.  please call us sooner if your blood sugar goes below 70, or if it stays over 200.

## 2012-06-29 NOTE — Progress Notes (Signed)
Subjective:    Patient ID: LEVETTE PAULICK, female    DOB: 1945-03-13, 67 y.o.   MRN: [id number]  HPI Pt returns for f/u of insulin-requiring DM (dx'ed 1996; not in the context of pancreatitis; she has associated TIA, CAD, and neuropathy of the lower extremities).   pt states she feels well in general.  no cbg record, but states cbg's are sometimes mildly low in the early hrs of the am.  It is higher in am if she eats at bedtime.   Pt reports 1 month of moderately increased pain at the feet, but no assoc numbness.   Past Medical History  Diagnosis Date  . Depression   . Hyperlipidemia   . Hypertension   . Diverticulitis   . History of transient ischemic attack (TIA) 2001    "possibly; never really pinned down what it was" (02/17/2012)  . Pneumonia 12/2010  . Bronchitis     "touch q now and then" (02/17/2012)  . Shortness of breath     "occasionally; at any time" (02/17/2012)  . OSA on CPAP   . Type II diabetes mellitus   . History of blood transfusion 1945    "when I was 27 days old" (02/17/2012)  . Migraines     "outgrew them I guess" (02/17/2012)    Past Surgical History  Procedure Laterality Date  . Cardiac catheterization  02/13/2012  . Coronary angioplasty with stent placement  02/17/2012    "1; first one ever" (02/17/2012)  . Left oophorectomy  1989  . Colostomy  01/18/1988  . Colostomy reversal  04/1988  . Appendectomy  04/1988  . Colectomy  01/18/1988    "took 3 feet out; result of lots of burst diverticula" (02/17/2012)  . Colectomy  04/1988    "9 more inches of colon" (02/17/2012)  . Abdominal hysterectomy  2000's    History   Social History  . Marital Status: Married    Spouse Name: N/A    Number of Children: N/A  . Years of Education: N/A   Occupational History  . Not on file.   Social History Main Topics  . Smoking status: Former Smoker -- 2 years    Types: Cigarettes    Quit date: 11/11/1969  . Smokeless tobacco: Never Used  . Alcohol Use: No  . Drug Use: No  .  Sexually Active: Not Currently   Other Topics Concern  . Not on file   Social History Narrative  . No narrative on file    Current Outpatient Prescriptions on File Prior to Visit  Medication Sig Dispense Refill  . albuterol (PROAIR HFA) 108 (90 BASE) MCG/ACT inhaler Inhale 2 puffs into the lungs every 4 (four) hours as needed. For shortness of breath      . aspirin 325 MG tablet Take 1 tablet (325 mg total) by mouth daily.      Marland Kitchen atorvastatin (LIPITOR) 10 MG tablet Take 10 mg by mouth daily.      . clopidogrel (PLAVIX) 75 MG tablet Take 75 mg by mouth daily.      . insulin aspart (NOVOLOG) 100 UNIT/ML injection Take 17 units with breakfast and 27 units with lunch and 27 units with dinner  8 vial  3  . Insulin Pen Needle (BD ULTRA-FINE PEN NEEDLES) 29G X 12.7MM MISC by Does not apply route 3 (three) times daily.        Marland Kitchen losartan-hydrochlorothiazide (HYZAAR) 50-12.5 MG per tablet Take 1 tablet by mouth daily.      Marland Kitchen  Multiple Vitamin (MULTIVITAMIN WITH MINERALS) TABS Take 1 tablet by mouth daily.      . nitroGLYCERIN (NITROSTAT) 0.4 MG SL tablet Place 1 tablet (0.4 mg total) under the tongue every 5 (five) minutes as needed for chest pain.  25 tablet  3  . NOVOLIN N RELION 100 UNIT/ML injection INJECT 75 UNITS SUBCUTANEOUSLY ONCE DAILY AT BEDTIME  20 mL  3  . insulin NPH (HUMULIN N,NOVOLIN N) 100 UNIT/ML injection Inject 60 Units into the skin at bedtime.       . [DISCONTINUED] telmisartan-hydrochlorothiazide (MICARDIS HCT) 80-12.5 MG per tablet Take 1 tablet by mouth daily.         No current facility-administered medications on file prior to visit.    Allergies  Allergen Reactions  . Erythromycin Other (See Comments)    "upset stomach" (02/17/2012)  . Penicillins Other (See Comments)    ">50 yr ago, I had strept throat; had gotten lots and lots of PCN; last dose caused me to stop breathing" (02/17/2012)  . Tetracycline Other (See Comments)    "upset stomach" (02/17/2012)  . Sulfonamide  Derivatives Rash and Other (See Comments)    "upset stomach" (02/17/2012)    Family History  Problem Relation Age of Onset  . Diabetes Mother    BP 118/80  Pulse 83  Ht 5\' 3"  (1.6 m)  Wt 216 lb (97.977 kg)  BMI 38.27 kg/m2  SpO2 96%  Review of Systems Denies LOC and weight change.    Objective:   Physical Exam VITAL SIGNS:  See vs page GENERAL: no distress  Lab Results  Component Value Date   HGBA1C 7.4* 06/29/2012      Assessment & Plan:  Foot pain, neuropathic, worse DM: Needs increased rx, if it can be done with a regimen that avoids or minimizes hypoglycemia.  Benefit of improved glycemic control should be weighed against risk of hypoglycemia.

## 2012-06-30 ENCOUNTER — Ambulatory Visit: Payer: Medicare Other | Admitting: Endocrinology

## 2012-10-18 DIAGNOSIS — X58XXXA Exposure to other specified factors, initial encounter: Secondary | ICD-10-CM | POA: Diagnosis not present

## 2012-10-18 DIAGNOSIS — IMO0002 Reserved for concepts with insufficient information to code with codable children: Secondary | ICD-10-CM | POA: Diagnosis not present

## 2012-11-10 ENCOUNTER — Encounter: Payer: Self-pay | Admitting: Endocrinology

## 2012-11-10 ENCOUNTER — Ambulatory Visit (INDEPENDENT_AMBULATORY_CARE_PROVIDER_SITE_OTHER): Payer: Medicare Other | Admitting: Endocrinology

## 2012-11-10 VITALS — BP 126/80 | HR 90 | Ht 62.0 in | Wt 220.0 lb

## 2012-11-10 DIAGNOSIS — E119 Type 2 diabetes mellitus without complications: Secondary | ICD-10-CM | POA: Diagnosis not present

## 2012-11-10 DIAGNOSIS — E1049 Type 1 diabetes mellitus with other diabetic neurological complication: Secondary | ICD-10-CM | POA: Diagnosis not present

## 2012-11-10 LAB — HEMOGLOBIN A1C: Hgb A1c MFr Bld: 7.8 % — ABNORMAL HIGH (ref 4.6–6.5)

## 2012-11-10 NOTE — Progress Notes (Signed)
Subjective:    Patient ID: Brittany Rojas, female    DOB: 1945/04/30, 67 y.o.   MRN: [id number]  HPI Pt returns for f/u of insulin-requiring DM (dx'ed 1996; not in the context of pancreatitis; she has mild if any neuropathy of the lower extremities; she has associated TIA, and CAD).  pt states she feels well in general.  she brings a scant record of her cbg's which i have reviewed today. highest in am (higher than at hs, possibly due to hs-snack).  She has not checked cbg in the past month.  pt states she feels well in general. Past Medical History  Diagnosis Date  . Depression   . Hyperlipidemia   . Hypertension   . Diverticulitis   . History of transient ischemic attack (TIA) 2001    "possibly; never really pinned down what it was" (02/17/2012)  . Pneumonia 12/2010  . Bronchitis     "touch q now and then" (02/17/2012)  . Shortness of breath     "occasionally; at any time" (02/17/2012)  . OSA on CPAP   . Type II diabetes mellitus   . History of blood transfusion 1945    "when I was 13 days old" (02/17/2012)  . Migraines     "outgrew them I guess" (02/17/2012)    Past Surgical History  Procedure Laterality Date  . Cardiac catheterization  02/13/2012  . Coronary angioplasty with stent placement  02/17/2012    "1; first one ever" (02/17/2012)  . Left oophorectomy  1989  . Colostomy  01/18/1988  . Colostomy reversal  04/1988  . Appendectomy  04/1988  . Colectomy  01/18/1988    "took 3 feet out; result of lots of burst diverticula" (02/17/2012)  . Colectomy  04/1988    "9 more inches of colon" (02/17/2012)  . Abdominal hysterectomy  2000's    History   Social History  . Marital Status: Married    Spouse Name: N/A    Number of Children: N/A  . Years of Education: N/A   Occupational History  . Not on file.   Social History Main Topics  . Smoking status: Former Smoker -- 2 years    Types: Cigarettes    Quit date: 11/11/1969  . Smokeless tobacco: Never Used  . Alcohol Use: No  . Drug Use: No   . Sexual Activity: Not Currently   Other Topics Concern  . Not on file   Social History Narrative  . No narrative on file    Current Outpatient Prescriptions on File Prior to Visit  Medication Sig Dispense Refill  . albuterol (PROAIR HFA) 108 (90 BASE) MCG/ACT inhaler Inhale 2 puffs into the lungs every 4 (four) hours as needed. For shortness of breath      . aspirin 325 MG tablet Take 1 tablet (325 mg total) by mouth daily.      Marland Kitchen atorvastatin (LIPITOR) 10 MG tablet Take 10 mg by mouth daily.      . clopidogrel (PLAVIX) 75 MG tablet Take 75 mg by mouth daily.      Marland Kitchen gabapentin (NEURONTIN) 300 MG capsule Take 600 mg by mouth at bedtime.      . Insulin Pen Needle (BD ULTRA-FINE PEN NEEDLES) 29G X 12.7MM MISC by Does not apply route 3 (three) times daily.        Marland Kitchen losartan-hydrochlorothiazide (HYZAAR) 50-12.5 MG per tablet Take 1 tablet by mouth daily.      . Multiple Vitamin (MULTIVITAMIN WITH MINERALS) TABS Take 1 tablet  by mouth daily.      . nitroGLYCERIN (NITROSTAT) 0.4 MG SL tablet Place 1 tablet (0.4 mg total) under the tongue every 5 (five) minutes as needed for chest pain.  25 tablet  3  . NOVOLIN N RELION 100 UNIT/ML injection INJECT 75 UNITS SUBCUTANEOUSLY ONCE DAILY AT BEDTIME  20 mL  3  . insulin NPH (HUMULIN N,NOVOLIN N) 100 UNIT/ML injection Inject 60 Units into the skin at bedtime.       . [DISCONTINUED] telmisartan-hydrochlorothiazide (MICARDIS HCT) 80-12.5 MG per tablet Take 1 tablet by mouth daily.         No current facility-administered medications on file prior to visit.    Allergies  Allergen Reactions  . Erythromycin Other (See Comments)    "upset stomach" (02/17/2012)  . Penicillins Other (See Comments)    ">50 yr ago, I had strept throat; had gotten lots and lots of PCN; last dose caused me to stop breathing" (02/17/2012)  . Tetracycline Other (See Comments)    "upset stomach" (02/17/2012)  . Sulfonamide Derivatives Rash and Other (See Comments)    "upset  stomach" (02/17/2012)   Family History  Problem Relation Age of Onset  . Diabetes Mother    BP 126/80  Pulse 90  Ht 5\' 2"  (1.575 m)  Wt 220 lb (99.791 kg)  BMI 40.23 kg/m2  SpO2 95%  Review of Systems denies hypoglycemia.  She has weight gain.      Objective:   Physical Exam VITAL SIGNS:  See vs page.   GENERAL: no distress.  (pt says a1c was 7.5% on 10/13/12, at [facility] hospital).      Assessment & Plan:  DM: she needs increased rx, if it can be done with a regimen that avoids or minimizes hypoglycemia.  Benefit of improved glycemic control should be weighed against risk of hypoglycemia.  CAD: in this context, she should avoid hypoglycemia. Depression: this complicates the rx of DM.

## 2012-11-10 NOTE — Patient Instructions (Addendum)
A urine test is being requested for you today.  We'll contact you with results.   pending the test results, please increase humalog to (just before each meal), 20-30-30 units.  If you eat at bedtime, take an extra 5 units for it. continue nph, 60 units at bedtime.   Please make a follow-up appointment in January. check your blood sugar 2 times a day.  vary the time of day when you check, between before the 3 meals, and at bedtime.  also check if you have symptoms of your blood sugar being too high or too low.  please keep a record of the readings and bring it to your next appointment here.  please call us sooner if your blood sugar goes below 70, or if it stays over 200.

## 2012-11-17 ENCOUNTER — Ambulatory Visit: Payer: Medicare Other | Admitting: Interventional Cardiology

## 2012-11-18 DIAGNOSIS — G4733 Obstructive sleep apnea (adult) (pediatric): Secondary | ICD-10-CM | POA: Diagnosis not present

## 2012-12-08 ENCOUNTER — Encounter: Payer: Self-pay | Admitting: Interventional Cardiology

## 2012-12-08 ENCOUNTER — Ambulatory Visit (INDEPENDENT_AMBULATORY_CARE_PROVIDER_SITE_OTHER): Payer: Medicare Other | Admitting: Interventional Cardiology

## 2012-12-08 VITALS — BP 112/52 | HR 102 | Ht 63.0 in | Wt 221.0 lb

## 2012-12-08 DIAGNOSIS — I25810 Atherosclerosis of coronary artery bypass graft(s) without angina pectoris: Secondary | ICD-10-CM

## 2012-12-08 DIAGNOSIS — I2510 Atherosclerotic heart disease of native coronary artery without angina pectoris: Secondary | ICD-10-CM | POA: Insufficient documentation

## 2012-12-08 DIAGNOSIS — R9439 Abnormal result of other cardiovascular function study: Secondary | ICD-10-CM

## 2012-12-08 DIAGNOSIS — I10 Essential (primary) hypertension: Secondary | ICD-10-CM

## 2012-12-08 NOTE — Progress Notes (Signed)
Patient ID: Brittany Rojas, female   DOB: 23-Nov-1945, 67 y.o.   MRN: [id number]    [address], [address] [place], [place]  [address] Phone: [phone] Fax:  [phone]  Date:  12/08/2012   ID:  Brittany Rojas, DOB 08/02/1945, MRN [id number]  PCP:  Mickie Hillier, MD   ASSESSMENT:  1. CAD with prior obtuse marginal stent, 2014, January post dilated to 3.0 in diameter DES 2. Hypertension 3. Hyperlipidemia 4. Easy bruising on  PLAN:  1. Stop Plavix in January 2. Clinical followup in 6 months   SUBJECTIVE: Brittany Rojas is a 67 y.o. female is doing well. She denies angina. There's still exertional dyspnea. She is quite sedentary. She complains of easy bruising on the combination of aspirin and Plavix. She needs double were performed and was to come off of Plavix. Her stent was placed in January 2014.   Wt Readings from Last 3 Encounters:  12/08/12 221 lb (100.245 kg)  11/10/12 220 lb (99.791 kg)  06/29/12 216 lb (97.977 kg)     Past Medical History  Diagnosis Date  . Depression   . Hyperlipidemia   . Hypertension   . Diverticulitis   . History of transient ischemic attack (TIA) 2001    "possibly; never really pinned down what it was" (02/17/2012)  . Pneumonia 12/2010  . Bronchitis     "touch q now and then" (02/17/2012)  . Shortness of breath     "occasionally; at any time" (02/17/2012)  . OSA on CPAP   . Type II diabetes mellitus   . History of blood transfusion 1945    "when I was 64 days old" (02/17/2012)  . Migraines     "outgrew them I guess" (02/17/2012)    Current Outpatient Prescriptions  Medication Sig Dispense Refill  . albuterol (PROAIR HFA) 108 (90 BASE) MCG/ACT inhaler Inhale 2 puffs into the lungs every 4 (four) hours as needed. For shortness of breath      . aspirin 325 MG tablet Take 1 tablet (325 mg total) by mouth daily.      Marland Kitchen atorvastatin (LIPITOR) 10 MG tablet Take 10 mg by mouth daily.      . clopidogrel (PLAVIX) 75 MG tablet Take 75 mg by  mouth daily.      Marland Kitchen gabapentin (NEURONTIN) 300 MG capsule Take 600 mg by mouth at bedtime.      . insulin aspart (NOVOLOG) 100 UNIT/ML injection Take 27 units with breakfast and 30 units with lunch and 30 units with dinner      . insulin NPH (HUMULIN N,NOVOLIN N) 100 UNIT/ML injection Inject 70 Units into the skin at bedtime.       . Insulin Pen Needle (BD ULTRA-FINE PEN NEEDLES) 29G X 12.7MM MISC by Does not apply route 3 (three) times daily.        Marland Kitchen losartan-hydrochlorothiazide (HYZAAR) 50-12.5 MG per tablet Take 1 tablet by mouth daily.      . Multiple Vitamin (MULTIVITAMIN WITH MINERALS) TABS Take 1 tablet by mouth daily.      . nitroGLYCERIN (NITROSTAT) 0.4 MG SL tablet Place 1 tablet (0.4 mg total) under the tongue every 5 (five) minutes as needed for chest pain.  25 tablet  3  . NOVOLIN N RELION 100 UNIT/ML injection INJECT 75 UNITS SUBCUTANEOUSLY ONCE DAILY AT BEDTIME  20 mL  3  . [DISCONTINUED] telmisartan-hydrochlorothiazide (MICARDIS HCT) 80-12.5 MG per tablet Take 1 tablet by mouth daily.  No current facility-administered medications for this visit.    Allergies:    Allergies  Allergen Reactions  . Erythromycin Other (See Comments)    "upset stomach" (02/17/2012)  . Penicillins Other (See Comments)    ">50 yr ago, I had strept throat; had gotten lots and lots of PCN; last dose caused me to stop breathing" (02/17/2012)  . Tetracycline Other (See Comments)    "upset stomach" (02/17/2012)  . Sulfonamide Derivatives Rash and Other (See Comments)    "upset stomach" (02/17/2012)    Social History:  The patient  reports that she quit smoking about 43 years ago. Her smoking use included Cigarettes. She smoked 0.00 packs per day for 2 years. She has never used smokeless tobacco. She reports that she does not drink alcohol or use illicit drugs.   ROS:  Please see the history of present illness.      All other systems reviewed and negative.   OBJECTIVE: VS:  BP 112/52  Pulse 102   Ht 5\' 3"  (1.6 m)  Wt 221 lb (100.245 kg)  BMI 39.16 kg/m2 Well nourished, well developed, in no acute distress, obese with poor dentition HEENT: normal Neck: JVD flat. Carotid bruit absent  Cardiac:  normal S1, S2; RRR; no murmur Lungs:  clear to auscultation bilaterally, no wheezing, rhonchi or rales Abd: soft, nontender, no hepatomegaly Ext: Edema absent. Pulses 2+ Skin: warm and dry Neuro:  CNs 2-12 intact, no focal abnormalities noted  EKG:  Not performed    Signed, Darci Needle III, MD 12/08/2012 3:29 PM

## 2012-12-08 NOTE — Patient Instructions (Signed)
Discontinue taking Plavix in January 2015  No other changes have be made today  Your physician wants you to follow-up in: 6 months You will receive a reminder letter in the mail two months in advance. If you don't receive a letter, please call our office to schedule the follow-up appointment.

## 2013-02-09 ENCOUNTER — Other Ambulatory Visit: Payer: Self-pay | Admitting: Internal Medicine

## 2013-02-09 MED ORDER — INSULIN LISPRO 100 UNIT/ML (KWIKPEN): PEN_INJECTOR | SUBCUTANEOUS | Status: DC

## 2013-02-09 NOTE — Progress Notes (Signed)
Pt brings a coupon for 5 free pens for Humalog and would like a Rx for Humalog instead of NovoLog to take to the pharmacy >> changed this on the medlist, Rx printed.

## 2013-02-13 DIAGNOSIS — R05 Cough: Secondary | ICD-10-CM | POA: Diagnosis not present

## 2013-02-13 DIAGNOSIS — B9789 Other viral agents as the cause of diseases classified elsewhere: Secondary | ICD-10-CM | POA: Diagnosis not present

## 2013-02-13 DIAGNOSIS — R059 Cough, unspecified: Secondary | ICD-10-CM | POA: Diagnosis not present

## 2013-02-23 ENCOUNTER — Encounter: Payer: Self-pay | Admitting: Endocrinology

## 2013-02-23 ENCOUNTER — Ambulatory Visit (INDEPENDENT_AMBULATORY_CARE_PROVIDER_SITE_OTHER): Payer: Medicare Other | Admitting: Endocrinology

## 2013-02-23 VITALS — BP 128/56 | HR 97 | Temp 98.1°F | Ht 63.0 in | Wt 222.0 lb

## 2013-02-23 DIAGNOSIS — E1065 Type 1 diabetes mellitus with hyperglycemia: Secondary | ICD-10-CM | POA: Diagnosis not present

## 2013-02-23 DIAGNOSIS — E1049 Type 1 diabetes mellitus with other diabetic neurological complication: Secondary | ICD-10-CM

## 2013-02-23 LAB — MICROALBUMIN / CREATININE URINE RATIO
CREATININE, U: 100.4 mg/dL
MICROALB UR: 0.8 mg/dL (ref 0.0–1.9)
MICROALB/CREAT RATIO: 0.8 mg/g (ref 0.0–30.0)

## 2013-02-23 LAB — HEMOGLOBIN A1C: Hgb A1c MFr Bld: 7.5 % — ABNORMAL HIGH (ref 4.6–6.5)

## 2013-02-23 NOTE — Patient Instructions (Addendum)
Blood and urine tests are being requested for you today.  We'll contact you with results.   Please make a follow-up appointment in 3 months. check your blood sugar 2 times a day.  vary the time of day when you check, between before the 3 meals, and at bedtime.  also check if you have symptoms of your blood sugar being too high or too low.  please keep a record of the readings and bring it to your next appointment here.  please call us sooner if your blood sugar goes below 70, or if it stays over 200.

## 2013-02-23 NOTE — Progress Notes (Signed)
Subjective:    Patient ID: Brittany Rojas, female    DOB: March 16, 1945, 68 y.o.   MRN: [id number]  HPI Pt returns for f/u of insulin-requiring DM (dx'ed 1996, on a routine blood test; not in the context of pancreatitis; she has mild if any neuropathy of the lower extremities; she has associated TIA, and CAD; she has been on insulin since 2005; she has never had DKA; only episode of severe hypoglycemia was in 2011).  no cbg record, but states cbg's are well-controlled.  There is no trend throughout the day.  pt states she feels well in general.   Past Medical History  Diagnosis Date  . Depression   . Hyperlipidemia   . Hypertension   . Diverticulitis   . History of transient ischemic attack (TIA) 2001    "possibly; never really pinned down what it was" (02/17/2012)  . Pneumonia 12/2010  . Bronchitis     "touch q now and then" (02/17/2012)  . Shortness of breath     "occasionally; at any time" (02/17/2012)  . OSA on CPAP   . Type II diabetes mellitus   . History of blood transfusion 1945    "when I was 38 days old" (02/17/2012)  . Migraines     "outgrew them I guess" (02/17/2012)    Past Surgical History  Procedure Laterality Date  . Cardiac catheterization  02/13/2012  . Coronary angioplasty with stent placement  02/17/2012    "1; first one ever" (02/17/2012)  . Left oophorectomy  1989  . Colostomy  01/18/1988  . Colostomy reversal  04/1988  . Appendectomy  04/1988  . Colectomy  01/18/1988    "took 3 feet out; result of lots of burst diverticula" (02/17/2012)  . Colectomy  04/1988    "9 more inches of colon" (02/17/2012)  . Abdominal hysterectomy  2000's    History   Social History  . Marital Status: Married    Spouse Name: N/A    Number of Children: N/A  . Years of Education: N/A   Occupational History  . Not on file.   Social History Main Topics  . Smoking status: Former Smoker -- 2 years    Types: Cigarettes    Quit date: 11/11/1969  . Smokeless tobacco: Never Used  . Alcohol Use:  No  . Drug Use: No  . Sexual Activity: Not Currently   Other Topics Concern  . Not on file   Social History Narrative  . No narrative on file    Current Outpatient Prescriptions on File Prior to Visit  Medication Sig Dispense Refill  . albuterol (PROAIR HFA) 108 (90 BASE) MCG/ACT inhaler Inhale 2 puffs into the lungs every 4 (four) hours as needed. For shortness of breath      . aspirin 325 MG tablet Take 1 tablet (325 mg total) by mouth daily.      Marland Kitchen atorvastatin (LIPITOR) 10 MG tablet Take 10 mg by mouth daily.      Marland Kitchen gabapentin (NEURONTIN) 300 MG capsule Take 600 mg by mouth at bedtime.      . Insulin Pen Needle (BD ULTRA-FINE PEN NEEDLES) 29G X 12.7MM MISC by Does not apply route 3 (three) times daily.        Marland Kitchen losartan-hydrochlorothiazide (HYZAAR) 50-12.5 MG per tablet Take 1 tablet by mouth daily.      . Multiple Vitamin (MULTIVITAMIN WITH MINERALS) TABS Take 1 tablet by mouth daily.      . nitroGLYCERIN (NITROSTAT) 0.4 MG SL tablet  Place 1 tablet (0.4 mg total) under the tongue every 5 (five) minutes as needed for chest pain.  25 tablet  3  . NOVOLIN N RELION 100 UNIT/ML injection INJECT 75 UNITS SUBCUTANEOUSLY ONCE DAILY AT BEDTIME  20 mL  3  . clopidogrel (PLAVIX) 75 MG tablet Take 75 mg by mouth daily.      . insulin NPH (HUMULIN N,NOVOLIN N) 100 UNIT/ML injection Inject 70 Units into the skin at bedtime.       . [DISCONTINUED] telmisartan-hydrochlorothiazide (MICARDIS HCT) 80-12.5 MG per tablet Take 1 tablet by mouth daily.         No current facility-administered medications on file prior to visit.    Allergies  Allergen Reactions  . Erythromycin Other (See Comments)    "upset stomach" (02/17/2012)  . Penicillins Other (See Comments)    ">50 yr ago, I had strept throat; had gotten lots and lots of PCN; last dose caused me to stop breathing" (02/17/2012)  . Tetracycline Other (See Comments)    "upset stomach" (02/17/2012)  . Sulfonamide Derivatives Rash and Other (See  Comments)    "upset stomach" (02/17/2012)    Family History  Problem Relation Age of Onset  . Diabetes Mother     BP 128/56  Pulse 97  Temp(Src) 98.1 F (36.7 C) (Oral)  Ht 5\' 3"  (1.6 m)  Wt 222 lb (100.699 kg)  BMI 39.34 kg/m2  SpO2 93%  Review of Systems denies hypoglycemia and weight gain.    Objective:   Physical Exam VITAL SIGNS:  See vs page GENERAL: no distress   Lab Results  Component Value Date   HGBA1C 7.5* 02/23/2013      Assessment & Plan:  DM: she needs increased rx, if it can be done with a regimen that avoids or minimizes hypoglycemia.  Benefit of improved glycemic control should be weighed against risk of hypoglycemia.  CAD: in this context, she should avoid hypoglycemia.

## 2013-02-24 ENCOUNTER — Telehealth: Payer: Self-pay | Admitting: Endocrinology

## 2013-02-24 NOTE — Telephone Encounter (Signed)
please call patient: Blood sugar is slightly high Please increase humalog to 3 times a day (just before each meal) 3 times a day (just before each meal), 25-35-35 units i'll see you next time.

## 2013-02-24 NOTE — Telephone Encounter (Signed)
Left pt a voicemail instructing upon changes.

## 2013-03-02 DIAGNOSIS — H4011X0 Primary open-angle glaucoma, stage unspecified: Secondary | ICD-10-CM | POA: Diagnosis not present

## 2013-03-17 ENCOUNTER — Other Ambulatory Visit: Payer: Self-pay | Admitting: Endocrinology

## 2013-04-13 ENCOUNTER — Other Ambulatory Visit: Payer: Self-pay | Admitting: Endocrinology

## 2013-05-25 ENCOUNTER — Encounter: Payer: Self-pay | Admitting: Endocrinology

## 2013-05-25 ENCOUNTER — Ambulatory Visit (INDEPENDENT_AMBULATORY_CARE_PROVIDER_SITE_OTHER): Payer: Medicare Other | Admitting: Endocrinology

## 2013-05-25 VITALS — BP 108/50 | HR 100 | Temp 98.2°F | Ht 63.0 in | Wt 215.0 lb

## 2013-05-25 DIAGNOSIS — E1049 Type 1 diabetes mellitus with other diabetic neurological complication: Secondary | ICD-10-CM

## 2013-05-25 DIAGNOSIS — E119 Type 2 diabetes mellitus without complications: Secondary | ICD-10-CM

## 2013-05-25 DIAGNOSIS — E1065 Type 1 diabetes mellitus with hyperglycemia: Principal | ICD-10-CM

## 2013-05-25 LAB — HEMOGLOBIN A1C: Hgb A1c MFr Bld: 7.5 % — ABNORMAL HIGH (ref 4.6–6.5)

## 2013-05-25 NOTE — Patient Instructions (Addendum)
A diabetes blood test is requested for you today.  We'll contact you with results.   Please make a follow-up appointment in 3 months. check your blood sugar 2 times a day.  vary the time of day when you check, between before the 3 meals, and at bedtime.  also check if you have symptoms of your blood sugar being too high or too low.  please keep a record of the readings and bring it to your next appointment here.  please call us sooner if your blood sugar goes below 70, or if it stays over 200.   Refer to a weight-loss surgery specialist.  you will receive a phone call, about a day and time of an informational meeting.

## 2013-05-25 NOTE — Progress Notes (Signed)
Subjective:    Patient ID: Brittany Rojas, female    DOB: 02-10-1946, 68 y.o.   MRN: [id number]  HPI Pt returns for f/u of insulin-requiring DM (dx'ed 1996, on a routine blood test; not in the context of pancreatitis; she has mild neuropathy of the lower extremities; she has associated TIA, and CAD; she has been on insulin since 2005; she has never had DKA; only episode of severe hypoglycemia was in 2011).  Since last ov, she had had only 1 episode of hypoglycemia.  This happened after a smaller-than-expected lunch.  It is highest in am (even if no hs snack).   Past Medical History  Diagnosis Date  . Depression   . Hyperlipidemia   . Hypertension   . Diverticulitis   . History of transient ischemic attack (TIA) 2001    "possibly; never really pinned down what it was" (02/17/2012)  . Pneumonia 12/2010  . Bronchitis     "touch q now and then" (02/17/2012)  . Shortness of breath     "occasionally; at any time" (02/17/2012)  . OSA on CPAP   . Type II diabetes mellitus   . History of blood transfusion 1945    "when I was 37 days old" (02/17/2012)  . Migraines     "outgrew them I guess" (02/17/2012)    Past Surgical History  Procedure Laterality Date  . Cardiac catheterization  02/13/2012  . Coronary angioplasty with stent placement  02/17/2012    "1; first one ever" (02/17/2012)  . Left oophorectomy  1989  . Colostomy  01/18/1988  . Colostomy reversal  04/1988  . Appendectomy  04/1988  . Colectomy  01/18/1988    "took 3 feet out; result of lots of burst diverticula" (02/17/2012)  . Colectomy  04/1988    "9 more inches of colon" (02/17/2012)  . Abdominal hysterectomy  2000's    History   Social History  . Marital Status: Married    Spouse Name: N/A    Number of Children: N/A  . Years of Education: N/A   Occupational History  . Not on file.   Social History Main Topics  . Smoking status: Former Smoker -- 2 years    Types: Cigarettes    Quit date: 11/11/1969  . Smokeless tobacco: Never Used    . Alcohol Use: No  . Drug Use: No  . Sexual Activity: Not Currently   Other Topics Concern  . Not on file   Social History Narrative  . No narrative on file    Current Outpatient Prescriptions on File Prior to Visit  Medication Sig Dispense Refill  . albuterol (PROAIR HFA) 108 (90 BASE) MCG/ACT inhaler Inhale 2 puffs into the lungs every 4 (four) hours as needed. For shortness of breath      . aspirin 325 MG tablet Take 1 tablet (325 mg total) by mouth daily.      Marland Kitchen atorvastatin (LIPITOR) 10 MG tablet Take 10 mg by mouth daily.      Marland Kitchen gabapentin (NEURONTIN) 300 MG capsule TAKE 1 CAPSULE BY MOUTH TWICE DAILY  180 capsule  1  . insulin lispro (HUMALOG) 100 UNIT/ML KiwkPen 3 times a day (just before each meal), 25-30-35 units      . Insulin Pen Needle (BD ULTRA-FINE PEN NEEDLES) 29G X 12.7MM MISC by Does not apply route 3 (three) times daily.        Marland Kitchen losartan-hydrochlorothiazide (HYZAAR) 50-12.5 MG per tablet Take 1 tablet by mouth daily.      Marland Kitchen  Multiple Vitamin (MULTIVITAMIN WITH MINERALS) TABS Take 1 tablet by mouth daily.      . nitroGLYCERIN (NITROSTAT) 0.4 MG SL tablet Place 1 tablet (0.4 mg total) under the tongue every 5 (five) minutes as needed for chest pain.  25 tablet  3  . NOVOLIN N RELION 100 UNIT/ML injection INJECT 75 UNITS SUBCUTANEOUSLY ONCE DAILY AT BEDTIME  20 mL  3  . clopidogrel (PLAVIX) 75 MG tablet Take 75 mg by mouth daily.      . insulin NPH (HUMULIN N,NOVOLIN N) 100 UNIT/ML injection Inject 80 Units into the skin at bedtime.       . [DISCONTINUED] telmisartan-hydrochlorothiazide (MICARDIS HCT) 80-12.5 MG per tablet Take 1 tablet by mouth daily.         No current facility-administered medications on file prior to visit.    Allergies  Allergen Reactions  . Erythromycin Other (See Comments)    "upset stomach" (02/17/2012)  . Penicillins Other (See Comments)    ">50 yr ago, I had strept throat; had gotten lots and lots of PCN; last dose caused me to stop  breathing" (02/17/2012)  . Tetracycline Other (See Comments)    "upset stomach" (02/17/2012)  . Sulfonamide Derivatives Rash and Other (See Comments)    "upset stomach" (02/17/2012)    Family History  Problem Relation Age of Onset  . Diabetes Mother     BP 108/50  Pulse 100  Temp(Src) 98.2 F (36.8 C) (Oral)  Ht 5\' 3"  (1.6 m)  Wt 215 lb (97.523 kg)  BMI 38.09 kg/m2  SpO2 95%  Review of Systems Denies LOC.  She had gained a few lbs.      Objective:   Physical Exam VITAL SIGNS:  See vs page GENERAL: no distress   Lab Results  Component Value Date   HGBA1C 7.5* 05/25/2013      Assessment & Plan:  DM: she needs increased rx, if it can be done with a regimen that avoids or minimizes hypoglycemia. CAD: in this context, she should avoid hypoglycemia.  Weight gain: this complicates the rx of DM.

## 2013-05-27 DIAGNOSIS — Z23 Encounter for immunization: Secondary | ICD-10-CM | POA: Diagnosis not present

## 2013-05-27 DIAGNOSIS — E782 Mixed hyperlipidemia: Secondary | ICD-10-CM | POA: Diagnosis not present

## 2013-05-27 DIAGNOSIS — L989 Disorder of the skin and subcutaneous tissue, unspecified: Secondary | ICD-10-CM | POA: Diagnosis not present

## 2013-05-27 DIAGNOSIS — E118 Type 2 diabetes mellitus with unspecified complications: Secondary | ICD-10-CM | POA: Diagnosis not present

## 2013-05-27 DIAGNOSIS — I10 Essential (primary) hypertension: Secondary | ICD-10-CM | POA: Diagnosis not present

## 2013-05-27 DIAGNOSIS — I2510 Atherosclerotic heart disease of native coronary artery without angina pectoris: Secondary | ICD-10-CM | POA: Diagnosis not present

## 2013-05-28 DIAGNOSIS — L989 Disorder of the skin and subcutaneous tissue, unspecified: Secondary | ICD-10-CM | POA: Diagnosis not present

## 2013-05-28 DIAGNOSIS — I10 Essential (primary) hypertension: Secondary | ICD-10-CM | POA: Diagnosis not present

## 2013-05-28 DIAGNOSIS — E118 Type 2 diabetes mellitus with unspecified complications: Secondary | ICD-10-CM | POA: Diagnosis not present

## 2013-05-28 DIAGNOSIS — I2510 Atherosclerotic heart disease of native coronary artery without angina pectoris: Secondary | ICD-10-CM | POA: Diagnosis not present

## 2013-05-28 DIAGNOSIS — E782 Mixed hyperlipidemia: Secondary | ICD-10-CM | POA: Diagnosis not present

## 2013-06-03 ENCOUNTER — Encounter: Payer: Self-pay | Admitting: Interventional Cardiology

## 2013-06-03 ENCOUNTER — Ambulatory Visit (INDEPENDENT_AMBULATORY_CARE_PROVIDER_SITE_OTHER): Payer: Medicare Other | Admitting: Interventional Cardiology

## 2013-06-03 VITALS — BP 161/68 | HR 93 | Ht 63.0 in | Wt 217.0 lb

## 2013-06-03 DIAGNOSIS — E785 Hyperlipidemia, unspecified: Secondary | ICD-10-CM | POA: Diagnosis not present

## 2013-06-03 DIAGNOSIS — I25810 Atherosclerosis of coronary artery bypass graft(s) without angina pectoris: Secondary | ICD-10-CM | POA: Diagnosis not present

## 2013-06-03 DIAGNOSIS — I10 Essential (primary) hypertension: Secondary | ICD-10-CM | POA: Diagnosis not present

## 2013-06-03 NOTE — Patient Instructions (Signed)
Your physician recommends that you continue on your current medications as directed. Please refer to the Current Medication list given to you today.  Your physician wants you to follow-up in: 1 year. You will receive a reminder letter in the mail two months in advance. If you don't receive a letter, please call our office to schedule the follow-up appointment.  

## 2013-06-03 NOTE — Progress Notes (Signed)
Patient ID: Brittany Rojas, female   DOB: 1945/06/11, 68 y.o.   MRN: [id number]    [address], [address] [place], [place]  [address] Phone: [phone] Fax:  [phone]  Date:  06/03/2013   ID:  Brittany Rojas, DOB 05-06-1945, MRN [id number]  PCP:  Gennette Pac, MD   ASSESSMENT:  1. Coronary artery disease, status post DES to obtuse marginal #1 in January 2014, asymptomatic 2. Hyperlipidemia on therapy 3. Hypertension, controlled  PLAN:  1. Aerobic activity and weight loss is agitated 2. No change in medical regimen 3. Clinical followup in one year   SUBJECTIVE: Brittany Rojas is a 68 y.o. female who is stable status post obtuse marginal stent greater than 15 months ago. She is off Plavix. She has no exertional dyspnea or angina. Clinically she feels that she has had a great recover from her cardiac event.   Wt Readings from Last 3 Encounters:  06/03/13 217 lb (98.431 kg)  05/25/13 215 lb (97.523 kg)  02/23/13 222 lb (100.699 kg)     Past Medical History  Diagnosis Date  . Depression   . Hyperlipidemia   . Hypertension   . Diverticulitis   . History of transient ischemic attack (TIA) 2001    "possibly; never really pinned down what it was" (02/17/2012)  . Pneumonia 12/2010  . Bronchitis     "touch q now and then" (02/17/2012)  . Shortness of breath     "occasionally; at any time" (02/17/2012)  . OSA on CPAP   . Type II diabetes mellitus   . History of blood transfusion 1945    "when I was 14 days old" (02/17/2012)  . Migraines     "outgrew them I guess" (02/17/2012)    Current Outpatient Prescriptions  Medication Sig Dispense Refill  . albuterol (PROAIR HFA) 108 (90 BASE) MCG/ACT inhaler Inhale 2 puffs into the lungs every 4 (four) hours as needed. For shortness of breath      . aspirin 325 MG tablet Take 1 tablet (325 mg total) by mouth daily.      Marland Kitchen atorvastatin (LIPITOR) 10 MG tablet Take 10 mg by mouth daily.      Marland Kitchen gabapentin (NEURONTIN) 300 MG capsule  TAKE 1 CAPSULE BY MOUTH TWICE DAILY  180 capsule  1  . insulin lispro (HUMALOG) 100 UNIT/ML KiwkPen 3 times a day (just before each meal), 25-30-35 units      . Insulin Pen Needle (BD ULTRA-FINE PEN NEEDLES) 29G X 12.7MM MISC by Does not apply route 3 (three) times daily.        Marland Kitchen losartan-hydrochlorothiazide (HYZAAR) 50-12.5 MG per tablet Take 1 tablet by mouth daily.      . Multiple Vitamin (MULTIVITAMIN WITH MINERALS) TABS Take 1 tablet by mouth daily.      . nitroGLYCERIN (NITROSTAT) 0.4 MG SL tablet Place 1 tablet (0.4 mg total) under the tongue every 5 (five) minutes as needed for chest pain.  25 tablet  3  . NOVOLIN N RELION 100 UNIT/ML injection INJECT 75 UNITS SUBCUTANEOUSLY ONCE DAILY AT BEDTIME  20 mL  3  . insulin NPH (HUMULIN N,NOVOLIN N) 100 UNIT/ML injection Inject 80 Units into the skin at bedtime.       . [DISCONTINUED] telmisartan-hydrochlorothiazide (MICARDIS HCT) 80-12.5 MG per tablet Take 1 tablet by mouth daily.         No current facility-administered medications for this visit.    Allergies:    Allergies  Allergen  Reactions  . Erythromycin Other (See Comments)    "upset stomach" (02/17/2012)  . Penicillins Other (See Comments)    ">50 yr ago, I had strept throat; had gotten lots and lots of PCN; last dose caused me to stop breathing" (02/17/2012)  . Tetracycline Other (See Comments)    "upset stomach" (02/17/2012)  . Sulfonamide Derivatives Rash and Other (See Comments)    "upset stomach" (02/17/2012)    Social History:  The patient  reports that she quit smoking about 43 years ago. Her smoking use included Cigarettes. She smoked 0.00 packs per day for 2 years. She has never used smokeless tobacco. She reports that she does not drink alcohol or use illicit drugs.   ROS:  Please see the history of present illness.   Denies neurological complaints and claudication   All other systems reviewed and negative.   OBJECTIVE: VS:  BP 161/68  Pulse 93  Ht 5\' 3"  (1.6 m)  Wt 217  lb (98.431 kg)  BMI 38.45 kg/m2 Well nourished, well developed, in no acute distress, obese HEENT: normal Neck: JVD flat. Carotid bruit 2+  Cardiac:  normal S1, S2; RRR; no murmur Lungs:  clear to auscultation bilaterally, no wheezing, rhonchi or rales Abd: soft, nontender, no hepatomegaly Ext: Edema absent. Pulses 2+ Skin: warm and dry Neuro:  CNs 2-12 intact, no focal abnormalities noted  EKG:  Normal       Signed, Illene Labrador III, MD 06/03/2013 4:28 PM

## 2013-06-07 DIAGNOSIS — D0430 Carcinoma in situ of skin of unspecified part of face: Secondary | ICD-10-CM | POA: Diagnosis not present

## 2013-06-07 DIAGNOSIS — D0460 Carcinoma in situ of skin of unspecified upper limb, including shoulder: Secondary | ICD-10-CM | POA: Diagnosis not present

## 2013-06-07 DIAGNOSIS — D0439 Carcinoma in situ of skin of other parts of face: Secondary | ICD-10-CM | POA: Diagnosis not present

## 2013-06-07 DIAGNOSIS — L570 Actinic keratosis: Secondary | ICD-10-CM | POA: Diagnosis not present

## 2013-06-07 DIAGNOSIS — L219 Seborrheic dermatitis, unspecified: Secondary | ICD-10-CM | POA: Diagnosis not present

## 2013-06-08 DIAGNOSIS — H4011X0 Primary open-angle glaucoma, stage unspecified: Secondary | ICD-10-CM | POA: Diagnosis not present

## 2013-06-08 DIAGNOSIS — E119 Type 2 diabetes mellitus without complications: Secondary | ICD-10-CM | POA: Diagnosis not present

## 2013-07-07 ENCOUNTER — Telehealth: Payer: Self-pay

## 2013-07-07 NOTE — Telephone Encounter (Signed)
Pt advised to call [phone] at [facility].

## 2013-07-07 NOTE — Telephone Encounter (Signed)
Patient called [facility] requesting a call back from Dr. Loanne Drilling office regarding referral to Bariatric Clinic. Thanks

## 2013-08-02 ENCOUNTER — Other Ambulatory Visit: Payer: Self-pay | Admitting: Family Medicine

## 2013-08-02 DIAGNOSIS — IMO0002 Reserved for concepts with insufficient information to code with codable children: Secondary | ICD-10-CM | POA: Diagnosis not present

## 2013-08-02 DIAGNOSIS — E119 Type 2 diabetes mellitus without complications: Secondary | ICD-10-CM | POA: Diagnosis not present

## 2013-08-02 DIAGNOSIS — I10 Essential (primary) hypertension: Secondary | ICD-10-CM | POA: Diagnosis not present

## 2013-08-02 DIAGNOSIS — M545 Low back pain, unspecified: Secondary | ICD-10-CM | POA: Diagnosis not present

## 2013-08-02 DIAGNOSIS — M5126 Other intervertebral disc displacement, lumbar region: Secondary | ICD-10-CM | POA: Diagnosis not present

## 2013-08-02 DIAGNOSIS — M5416 Radiculopathy, lumbar region: Secondary | ICD-10-CM

## 2013-08-10 ENCOUNTER — Ambulatory Visit
Admission: RE | Admit: 2013-08-10 | Discharge: 2013-08-10 | Disposition: A | Discharge status: Home / Self Care | Payer: Medicare Other | Source: Ambulatory Visit | Attending: Family Medicine | Admitting: Family Medicine

## 2013-08-10 DIAGNOSIS — M5126 Other intervertebral disc displacement, lumbar region: Secondary | ICD-10-CM | POA: Diagnosis not present

## 2013-08-10 DIAGNOSIS — M47817 Spondylosis without myelopathy or radiculopathy, lumbosacral region: Secondary | ICD-10-CM | POA: Diagnosis not present

## 2013-08-10 DIAGNOSIS — M5416 Radiculopathy, lumbar region: Secondary | ICD-10-CM

## 2013-08-10 DIAGNOSIS — M48061 Spinal stenosis, lumbar region without neurogenic claudication: Secondary | ICD-10-CM | POA: Diagnosis not present

## 2013-08-10 MED ORDER — GADOBENATE DIMEGLUMINE 529 MG/ML IV SOLN
20.0000 mL | Freq: Once | INTRAVENOUS | Status: AC | PRN
  Administered 2013-08-10: 20 mL via INTRAVENOUS

## 2013-08-19 DIAGNOSIS — M4800 Spinal stenosis, site unspecified: Secondary | ICD-10-CM | POA: Diagnosis not present

## 2013-08-19 DIAGNOSIS — R9389 Abnormal findings on diagnostic imaging of other specified body structures: Secondary | ICD-10-CM | POA: Diagnosis not present

## 2013-08-19 DIAGNOSIS — M545 Low back pain, unspecified: Secondary | ICD-10-CM | POA: Diagnosis not present

## 2013-08-24 ENCOUNTER — Ambulatory Visit (INDEPENDENT_AMBULATORY_CARE_PROVIDER_SITE_OTHER): Payer: Medicare Other | Admitting: Endocrinology

## 2013-08-24 ENCOUNTER — Encounter: Payer: Self-pay | Admitting: Endocrinology

## 2013-08-24 VITALS — BP 124/60 | HR 96 | Temp 97.8°F | Ht 63.0 in | Wt 217.0 lb

## 2013-08-24 DIAGNOSIS — E1065 Type 1 diabetes mellitus with hyperglycemia: Principal | ICD-10-CM

## 2013-08-24 DIAGNOSIS — E1049 Type 1 diabetes mellitus with other diabetic neurological complication: Secondary | ICD-10-CM

## 2013-08-24 DIAGNOSIS — I25810 Atherosclerosis of coronary artery bypass graft(s) without angina pectoris: Secondary | ICD-10-CM | POA: Diagnosis not present

## 2013-08-24 LAB — HEMOGLOBIN A1C: Hgb A1c MFr Bld: 7.1 % — ABNORMAL HIGH (ref 4.6–6.5)

## 2013-08-24 MED ORDER — INSULIN REGULAR HUMAN 100 UNIT/ML IJ SOLN: INTRAMUSCULAR | Status: DC

## 2013-08-24 NOTE — Patient Instructions (Addendum)
A diabetes blood test is requested for you today.  We'll contact you with results.   Please make a follow-up appointment in 3 months.  check your blood sugar 2 times a day.  vary the time of day when you check, between before the 3 meals, and at bedtime.  also check if you have symptoms of your blood sugar being too high or too low.  please keep a record of the readings and bring it to your next appointment here.  please call us sooner if your blood sugar goes below 70, or if it stays over 200.   Please change the humalog to regular insulin.  i have sent a prescription to your pharmacy.  Also, take 5 units of regular insulin at bedtime, if you eat then.

## 2013-08-24 NOTE — Progress Notes (Signed)
Subjective:    Patient ID: Brittany Rojas, female    DOB: 11/04/1945, 68 y.o.   MRN: [id number]  HPI Pt returns for f/u of insulin-requiring DM (dx'ed 1996, on a routine blood test; she has mild neuropathy of the lower extremities; she has associated TIA, and CAD; she has been on insulin since 2005; she has never had DKA ot pancreatitis; only episode of severe hypoglycemia was in 2011; she takes multiple daily injections).  Since last ov, she has had only 1 episode of hypoglycemia.  This happened after a smaller-than-expected lunch.  It is highest in am (even if no hs snack).  no cbg record, but states cbg's vary from 52-mid-100's.  It is lowest before lunch, and highest in am (only if she eats at hs).  She says she can no longer afford humalog.   Past Medical History  Diagnosis Date  . Depression   . Hyperlipidemia   . Hypertension   . Diverticulitis   . History of transient ischemic attack (TIA) 2001    "possibly; never really pinned down what it was" (02/17/2012)  . Pneumonia 12/2010  . Bronchitis     "touch q now and then" (02/17/2012)  . Shortness of breath     "occasionally; at any time" (02/17/2012)  . OSA on CPAP   . Type II diabetes mellitus   . History of blood transfusion 1945    "when I was 56 days old" (02/17/2012)  . Migraines     "outgrew them I guess" (02/17/2012)    Past Surgical History  Procedure Laterality Date  . Cardiac catheterization  02/13/2012  . Coronary angioplasty with stent placement  02/17/2012    "1; first one ever" (02/17/2012)  . Left oophorectomy  1989  . Colostomy  01/18/1988  . Colostomy reversal  04/1988  . Appendectomy  04/1988  . Colectomy  01/18/1988    "took 3 feet out; result of lots of burst diverticula" (02/17/2012)  . Colectomy  04/1988    "9 more inches of colon" (02/17/2012)  . Abdominal hysterectomy  2000's    History   Social History  . Marital Status: Married    Spouse Name: N/A    Number of Children: N/A  . Years of Education: N/A    Occupational History  . Not on file.   Social History Main Topics  . Smoking status: Former Smoker -- 2 years    Types: Cigarettes    Quit date: 11/11/1969  . Smokeless tobacco: Never Used  . Alcohol Use: No  . Drug Use: No  . Sexual Activity: Not Currently   Other Topics Concern  . Not on file   Social History Narrative  . No narrative on file    Current Outpatient Prescriptions on File Prior to Visit  Medication Sig Dispense Refill  . albuterol (PROAIR HFA) 108 (90 BASE) MCG/ACT inhaler Inhale 2 puffs into the lungs every 4 (four) hours as needed. For shortness of breath      . aspirin 325 MG tablet Take 1 tablet (325 mg total) by mouth daily.      Marland Kitchen atorvastatin (LIPITOR) 10 MG tablet Take 10 mg by mouth daily.      Marland Kitchen gabapentin (NEURONTIN) 300 MG capsule TAKE 1 CAPSULE BY MOUTH TWICE DAILY  180 capsule  1  . Insulin Pen Needle (BD ULTRA-FINE PEN NEEDLES) 29G X 12.7MM MISC by Does not apply route 3 (three) times daily.        Marland Kitchen losartan-hydrochlorothiazide (HYZAAR)  50-12.5 MG per tablet Take 1 tablet by mouth daily.      . Multiple Vitamin (MULTIVITAMIN WITH MINERALS) TABS Take 1 tablet by mouth daily.      . nitroGLYCERIN (NITROSTAT) 0.4 MG SL tablet Place 1 tablet (0.4 mg total) under the tongue every 5 (five) minutes as needed for chest pain.  25 tablet  3  . insulin NPH (HUMULIN N,NOVOLIN N) 100 UNIT/ML injection Inject 80 Units into the skin at bedtime.       . [DISCONTINUED] telmisartan-hydrochlorothiazide (MICARDIS HCT) 80-12.5 MG per tablet Take 1 tablet by mouth daily.         No current facility-administered medications on file prior to visit.    Allergies  Allergen Reactions  . Erythromycin Other (See Comments)    "upset stomach" (02/17/2012)  . Penicillins Other (See Comments)    ">50 yr ago, I had strept throat; had gotten lots and lots of PCN; last dose caused me to stop breathing" (02/17/2012)  . Tetracycline Other (See Comments)    "upset stomach"  (02/17/2012)  . Sulfonamide Derivatives Rash and Other (See Comments)    "upset stomach" (02/17/2012)    Family History  Problem Relation Age of Onset  . Diabetes Mother     BP 124/60  Pulse 96  Temp(Src) 97.8 F (36.6 C) (Oral)  Ht 5\' 3"  (1.6 m)  Wt 217 lb (98.431 kg)  BMI 38.45 kg/m2  SpO2 95%  Review of Systems Denies weight change and LOC.      Objective:   Physical Exam Pulses: dorsalis pedis intact bilat.  Feet: no deformity. normal color and temp. trace bilat leg edema. There is bilateral onychomycosis, and bilat hammer toes. Skin: no ulcer on the feet, but skin is dry. bilat heavy calluses.   Neuro: sensation is intact to touch on the feet.     Lab Results  Component Value Date   HGBA1C 7.1* 08/24/2013      Assessment & Plan:  DM: mild exacerbation Side effect of rx: hypoglycemia, new   Patient is advised the following: Patient Instructions  A diabetes blood test is requested for you today.  We'll contact you with results.   Please make a follow-up appointment in 3 months.  check your blood sugar 2 times a day.  vary the time of day when you check, between before the 3 meals, and at bedtime.  also check if you have symptoms of your blood sugar being too high or too low.  please keep a record of the readings and bring it to your next appointment here.  please call us sooner if your blood sugar goes below 70, or if it stays over 200.   Please change the humalog to regular insulin.  i have sent a prescription to your pharmacy.  Also, take 5 units of regular insulin at bedtime, if you eat then.

## 2013-09-06 DIAGNOSIS — M47817 Spondylosis without myelopathy or radiculopathy, lumbosacral region: Secondary | ICD-10-CM | POA: Diagnosis not present

## 2013-09-06 DIAGNOSIS — Z6839 Body mass index (BMI) 39.0-39.9, adult: Secondary | ICD-10-CM | POA: Diagnosis not present

## 2013-09-06 DIAGNOSIS — I10 Essential (primary) hypertension: Secondary | ICD-10-CM | POA: Diagnosis not present

## 2013-09-06 DIAGNOSIS — M545 Low back pain, unspecified: Secondary | ICD-10-CM | POA: Diagnosis not present

## 2013-09-13 ENCOUNTER — Other Ambulatory Visit: Payer: Self-pay | Admitting: Endocrinology

## 2013-10-11 DIAGNOSIS — M5416 Radiculopathy, lumbar region: Secondary | ICD-10-CM | POA: Insufficient documentation

## 2013-10-11 DIAGNOSIS — M47817 Spondylosis without myelopathy or radiculopathy, lumbosacral region: Secondary | ICD-10-CM | POA: Diagnosis not present

## 2013-10-11 DIAGNOSIS — IMO0002 Reserved for concepts with insufficient information to code with codable children: Secondary | ICD-10-CM | POA: Diagnosis not present

## 2013-10-11 DIAGNOSIS — M545 Low back pain, unspecified: Secondary | ICD-10-CM | POA: Diagnosis not present

## 2013-10-26 DIAGNOSIS — M5126 Other intervertebral disc displacement, lumbar region: Secondary | ICD-10-CM | POA: Diagnosis not present

## 2013-10-26 DIAGNOSIS — I10 Essential (primary) hypertension: Secondary | ICD-10-CM | POA: Diagnosis not present

## 2013-10-26 DIAGNOSIS — E119 Type 2 diabetes mellitus without complications: Secondary | ICD-10-CM | POA: Diagnosis not present

## 2013-10-26 DIAGNOSIS — E559 Vitamin D deficiency, unspecified: Secondary | ICD-10-CM | POA: Diagnosis not present

## 2013-10-26 DIAGNOSIS — I2510 Atherosclerotic heart disease of native coronary artery without angina pectoris: Secondary | ICD-10-CM | POA: Diagnosis not present

## 2013-10-26 DIAGNOSIS — Z23 Encounter for immunization: Secondary | ICD-10-CM | POA: Diagnosis not present

## 2013-10-26 DIAGNOSIS — E669 Obesity, unspecified: Secondary | ICD-10-CM | POA: Diagnosis not present

## 2013-10-26 DIAGNOSIS — E782 Mixed hyperlipidemia: Secondary | ICD-10-CM | POA: Diagnosis not present

## 2013-11-08 DIAGNOSIS — M545 Low back pain, unspecified: Secondary | ICD-10-CM | POA: Diagnosis not present

## 2013-11-08 DIAGNOSIS — M47817 Spondylosis without myelopathy or radiculopathy, lumbosacral region: Secondary | ICD-10-CM | POA: Diagnosis not present

## 2013-11-08 DIAGNOSIS — IMO0002 Reserved for concepts with insufficient information to code with codable children: Secondary | ICD-10-CM | POA: Diagnosis not present

## 2013-11-08 DIAGNOSIS — Z6839 Body mass index (BMI) 39.0-39.9, adult: Secondary | ICD-10-CM | POA: Diagnosis not present

## 2013-11-18 DIAGNOSIS — G4733 Obstructive sleep apnea (adult) (pediatric): Secondary | ICD-10-CM | POA: Diagnosis not present

## 2013-11-18 DIAGNOSIS — Z6838 Body mass index (BMI) 38.0-38.9, adult: Secondary | ICD-10-CM | POA: Diagnosis not present

## 2013-11-23 ENCOUNTER — Ambulatory Visit (INDEPENDENT_AMBULATORY_CARE_PROVIDER_SITE_OTHER): Payer: Medicare Other | Admitting: Endocrinology

## 2013-11-23 ENCOUNTER — Encounter: Payer: Self-pay | Admitting: Endocrinology

## 2013-11-23 VITALS — BP 120/52 | HR 96 | Temp 98.3°F | Ht 63.0 in | Wt 213.0 lb

## 2013-11-23 DIAGNOSIS — E1041 Type 1 diabetes mellitus with diabetic mononeuropathy: Secondary | ICD-10-CM | POA: Diagnosis not present

## 2013-11-23 DIAGNOSIS — E1049 Type 1 diabetes mellitus with other diabetic neurological complication: Secondary | ICD-10-CM

## 2013-11-23 DIAGNOSIS — IMO0002 Reserved for concepts with insufficient information to code with codable children: Secondary | ICD-10-CM

## 2013-11-23 DIAGNOSIS — I25810 Atherosclerosis of coronary artery bypass graft(s) without angina pectoris: Secondary | ICD-10-CM | POA: Diagnosis not present

## 2013-11-23 DIAGNOSIS — E1065 Type 1 diabetes mellitus with hyperglycemia: Principal | ICD-10-CM

## 2013-11-23 LAB — HEMOGLOBIN A1C: Hgb A1c MFr Bld: 7.6 % — ABNORMAL HIGH (ref 4.6–6.5)

## 2013-11-23 NOTE — Patient Instructions (Addendum)
A diabetes blood test is requested for you today.  We'll contact you with results.   Please make a follow-up appointment in 3 months.  check your blood sugar 2 times a day.  vary the time of day when you check, between before the 3 meals, and at bedtime.  also check if you have symptoms of your blood sugar being too high or too low.  please keep a record of the readings and bring it to your next appointment here.  please call us sooner if your blood sugar goes below 70, or if it stays over 200.   Please continue the same regular insulin.  Also, take 5 units of regular insulin at bedtime, if you eat then.   Reduce the NPH to 60 units at bedtime.   Let me know if you decide to pursue the insulin pump.

## 2013-11-23 NOTE — Progress Notes (Signed)
Subjective:    Patient ID: Brittany Rojas, female    DOB: January 20, 1946, 68 y.o.   MRN: [id number]  HPI Pt returns for f/u of diabetes mellitus: DM type: Insulin-requiring type 2 Dx'ed: 9326 Complications: polyneuropathy, TIA, and CAD Therapy: insulin since 2005 GDM: never DKA: never Severe hypoglycemia: once, in 2011 Pancreatitis: never Other:  she takes multiple daily injections. Interval history:  no cbg record, but states cbg's vary from 55-200's.  It is lowest in the middle of the night. She is still considering the weight-loss surgery.  pt states she feels well in general. Past Medical History  Diagnosis Date  . Depression   . Hyperlipidemia   . Hypertension   . Diverticulitis   . History of transient ischemic attack (TIA) 2001    "possibly; never really pinned down what it was" (02/17/2012)  . Pneumonia 12/2010  . Bronchitis     "touch q now and then" (02/17/2012)  . Shortness of breath     "occasionally; at any time" (02/17/2012)  . OSA on CPAP   . Type II diabetes mellitus   . History of blood transfusion 1945    "when I was 18 days old" (02/17/2012)  . Migraines     "outgrew them I guess" (02/17/2012)    Past Surgical History  Procedure Laterality Date  . Cardiac catheterization  02/13/2012  . Coronary angioplasty with stent placement  02/17/2012    "1; first one ever" (02/17/2012)  . Left oophorectomy  1989  . Colostomy  01/18/1988  . Colostomy reversal  04/1988  . Appendectomy  04/1988  . Colectomy  01/18/1988    "took 3 feet out; result of lots of burst diverticula" (02/17/2012)  . Colectomy  04/1988    "9 more inches of colon" (02/17/2012)  . Abdominal hysterectomy  2000's    History   Social History  . Marital Status: Married    Spouse Name: N/A    Number of Children: N/A  . Years of Education: N/A   Occupational History  . Not on file.   Social History Main Topics  . Smoking status: Former Smoker -- 2 years    Types: Cigarettes    Quit date: 11/11/1969  .  Smokeless tobacco: Never Used  . Alcohol Use: No  . Drug Use: No  . Sexual Activity: Not Currently   Other Topics Concern  . Not on file   Social History Narrative  . No narrative on file    Current Outpatient Prescriptions on File Prior to Visit  Medication Sig Dispense Refill  . albuterol (PROAIR HFA) 108 (90 BASE) MCG/ACT inhaler Inhale 2 puffs into the lungs every 4 (four) hours as needed. For shortness of breath      . aspirin 325 MG tablet Take 1 tablet (325 mg total) by mouth daily.      Marland Kitchen atorvastatin (LIPITOR) 10 MG tablet Take 10 mg by mouth daily.      Marland Kitchen gabapentin (NEURONTIN) 300 MG capsule TAKE 1 CAPSULE BY MOUTH TWICE DAILY  180 capsule  0  . Insulin Pen Needle (BD ULTRA-FINE PEN NEEDLES) 29G X 12.7MM MISC by Does not apply route 3 (three) times daily.        . insulin regular (NOVOLIN R,HUMULIN R) 100 units/mL injection 3 times a day (just before each meal), 25-35-45 units, and syringes 4/month.      . losartan-hydrochlorothiazide (HYZAAR) 50-12.5 MG per tablet Take 1 tablet by mouth daily.      . Multiple  Vitamin (MULTIVITAMIN WITH MINERALS) TABS Take 1 tablet by mouth daily.      . nitroGLYCERIN (NITROSTAT) 0.4 MG SL tablet Place 1 tablet (0.4 mg total) under the tongue every 5 (five) minutes as needed for chest pain.  25 tablet  3  . traMADol (ULTRAM) 50 MG tablet Take by mouth every 6 (six) hours as needed.      . insulin NPH (HUMULIN N,NOVOLIN N) 100 UNIT/ML injection Inject 60 Units into the skin at bedtime.       . [DISCONTINUED] telmisartan-hydrochlorothiazide (MICARDIS HCT) 80-12.5 MG per tablet Take 1 tablet by mouth daily.         No current facility-administered medications on file prior to visit.    Allergies  Allergen Reactions  . Erythromycin Other (See Comments)    "upset stomach" (02/17/2012)  . Penicillins Other (See Comments)    ">50 yr ago, I had strept throat; had gotten lots and lots of PCN; last dose caused me to stop breathing" (02/17/2012)  .  Tetracycline Other (See Comments)    "upset stomach" (02/17/2012)  . Sulfonamide Derivatives Rash and Other (See Comments)    "upset stomach" (02/17/2012)    Family History  Problem Relation Age of Onset  . Diabetes Mother     BP 120/52  Pulse 96  Temp(Src) 98.3 F (36.8 C) (Oral)  Ht 5\' 3"  (1.6 m)  Wt 213 lb (96.616 kg)  BMI 37.74 kg/m2  SpO2 93%  Review of Systems She denies LOC and weight change.      Objective:   Physical Exam VITAL SIGNS:  See vs page GENERAL: no distress.  Pulses: dorsalis pedis intact bilat.  Feet: no deformity.  trace bilat leg edema. There is bilateral onychomycosis, and bilat hammer toes.  Skin: no ulcer on the feet.  normal color and temp on the feet.  Neuro: sensation is intact to touch on the feet.    Lab Results  Component Value Date   HGBA1C 7.6* 11/23/2013      Assessment & Plan:  DM: mild exacerbation. Noncompliance with cbg recording: I'll work around this as best I can  Patient is advised the following: Patient Instructions  A diabetes blood test is requested for you today.  We'll contact you with results.   Please make a follow-up appointment in 3 months.  check your blood sugar 2 times a day.  vary the time of day when you check, between before the 3 meals, and at bedtime.  also check if you have symptoms of your blood sugar being too high or too low.  please keep a record of the readings and bring it to your next appointment here.  please call us sooner if your blood sugar goes below 70, or if it stays over 200.   Please continue the same regular insulin.  Also, take 5 units of regular insulin at bedtime, if you eat then.   Reduce the NPH to 60 units at bedtime.   Let me know if you decide to pursue the insulin pump.    Increase the regular to 3 times a day (just before each meal), 25-35-45 units.

## 2013-12-09 ENCOUNTER — Other Ambulatory Visit: Payer: Self-pay | Admitting: Endocrinology

## 2013-12-21 DIAGNOSIS — M545 Low back pain, unspecified: Secondary | ICD-10-CM | POA: Insufficient documentation

## 2013-12-21 DIAGNOSIS — M4727 Other spondylosis with radiculopathy, lumbosacral region: Secondary | ICD-10-CM | POA: Insufficient documentation

## 2013-12-21 DIAGNOSIS — G8929 Other chronic pain: Secondary | ICD-10-CM | POA: Insufficient documentation

## 2013-12-23 DIAGNOSIS — H4011X1 Primary open-angle glaucoma, mild stage: Secondary | ICD-10-CM | POA: Diagnosis not present

## 2014-01-20 ENCOUNTER — Encounter (HOSPITAL_COMMUNITY): Payer: Self-pay | Admitting: Interventional Cardiology

## 2014-02-15 DIAGNOSIS — Z1231 Encounter for screening mammogram for malignant neoplasm of breast: Secondary | ICD-10-CM | POA: Diagnosis not present

## 2014-02-21 ENCOUNTER — Ambulatory Visit (INDEPENDENT_AMBULATORY_CARE_PROVIDER_SITE_OTHER): Payer: Medicare Other | Admitting: Endocrinology

## 2014-02-21 ENCOUNTER — Encounter: Payer: Self-pay | Admitting: Endocrinology

## 2014-02-21 VITALS — BP 132/84 | HR 98 | Temp 98.0°F | Ht 63.0 in | Wt 212.0 lb

## 2014-02-21 DIAGNOSIS — IMO0002 Reserved for concepts with insufficient information to code with codable children: Secondary | ICD-10-CM

## 2014-02-21 DIAGNOSIS — E1065 Type 1 diabetes mellitus with hyperglycemia: Principal | ICD-10-CM

## 2014-02-21 DIAGNOSIS — E1041 Type 1 diabetes mellitus with diabetic mononeuropathy: Secondary | ICD-10-CM

## 2014-02-21 DIAGNOSIS — E1049 Type 1 diabetes mellitus with other diabetic neurological complication: Secondary | ICD-10-CM

## 2014-02-21 LAB — HEMOGLOBIN A1C: HEMOGLOBIN A1C: 6.6 % — AB (ref 4.6–6.5)

## 2014-02-21 NOTE — Progress Notes (Signed)
Subjective:    Patient ID: Brittany Rojas, female    DOB: 12-25-1945, 69 y.o.   MRN: [id number]  HPI Pt returns for f/u of diabetes mellitus: DM type: Insulin-requiring type 2 Dx'ed: 3810 Complications: polyneuropathy, TIA, and CAD Therapy: insulin since 2005 GDM: never DKA: never Severe hypoglycemia: once, in 2011 Pancreatitis: never.   Other:  she takes multiple daily injections; she declines pump rx.  Interval history: She is still considering the weight-loss surgery.  pt states she feels well in general.  no cbg record, but states cbg's vary from 73-190.  There is no trend throughout the day.   pt states she feels well in general. Past Medical History  Diagnosis Date  . Depression   . Hyperlipidemia   . Hypertension   . Diverticulitis   . History of transient ischemic attack (TIA) 2001    "possibly; never really pinned down what it was" (02/17/2012)  . Pneumonia 12/2010  . Bronchitis     "touch q now and then" (02/17/2012)  . Shortness of breath     "occasionally; at any time" (02/17/2012)  . OSA on CPAP   . Type II diabetes mellitus   . History of blood transfusion 1945    "when I was 15 days old" (02/17/2012)  . Migraines     "outgrew them I guess" (02/17/2012)    Past Surgical History  Procedure Laterality Date  . Cardiac catheterization  02/13/2012  . Coronary angioplasty with stent placement  02/17/2012    "1; first one ever" (02/17/2012)  . Left oophorectomy  1989  . Colostomy  01/18/1988  . Colostomy reversal  04/1988  . Appendectomy  04/1988  . Colectomy  01/18/1988    "took 3 feet out; result of lots of burst diverticula" (02/17/2012)  . Colectomy  04/1988    "9 more inches of colon" (02/17/2012)  . Abdominal hysterectomy  2000's  . Percutaneous coronary stent intervention (pci-s) N/A 02/17/2012    Procedure: PERCUTANEOUS CORONARY STENT INTERVENTION (PCI-S);  Surgeon: Sinclair Grooms, MD;  Location: [facility] CATH LAB;  Service: Cardiovascular;  Laterality: N/A;    History    Social History  . Marital Status: Married    Spouse Name: N/A    Number of Children: N/A  . Years of Education: N/A   Occupational History  . Not on file.   Social History Main Topics  . Smoking status: Former Smoker -- 2 years    Types: Cigarettes    Quit date: 11/11/1969  . Smokeless tobacco: Never Used  . Alcohol Use: No  . Drug Use: No  . Sexual Activity: Not Currently   Other Topics Concern  . Not on file   Social History Narrative    Current Outpatient Prescriptions on File Prior to Visit  Medication Sig Dispense Refill  . albuterol (PROAIR HFA) 108 (90 BASE) MCG/ACT inhaler Inhale 2 puffs into the lungs every 4 (four) hours as needed. For shortness of breath    . aspirin 325 MG tablet Take 1 tablet (325 mg total) by mouth daily.    Marland Kitchen atorvastatin (LIPITOR) 10 MG tablet Take 10 mg by mouth daily.    Marland Kitchen gabapentin (NEURONTIN) 300 MG capsule TAKE 1 CAPSULE BY MOUTH TWICE DAILY 180 capsule 1  . Insulin Pen Needle (BD ULTRA-FINE PEN NEEDLES) 29G X 12.7MM MISC by Does not apply route 3 (three) times daily.      . insulin regular (NOVOLIN R,HUMULIN R) 100 units/mL injection 3 times a day (  just before each meal), 25-35-45 units, and syringes 4/month.    . losartan-hydrochlorothiazide (HYZAAR) 50-12.5 MG per tablet Take 1 tablet by mouth daily.    . Multiple Vitamin (MULTIVITAMIN WITH MINERALS) TABS Take 1 tablet by mouth daily.    . nitroGLYCERIN (NITROSTAT) 0.4 MG SL tablet Place 1 tablet (0.4 mg total) under the tongue every 5 (five) minutes as needed for chest pain. 25 tablet 3  . traMADol (ULTRAM) 50 MG tablet Take by mouth every 6 (six) hours as needed.    . insulin NPH (HUMULIN N,NOVOLIN N) 100 UNIT/ML injection Inject 60 Units into the skin at bedtime.     . [DISCONTINUED] telmisartan-hydrochlorothiazide (MICARDIS HCT) 80-12.5 MG per tablet Take 1 tablet by mouth daily.       No current facility-administered medications on file prior to visit.    Allergies   Allergen Reactions  . Erythromycin Other (See Comments)    "upset stomach" (02/17/2012)  . Penicillins Other (See Comments)    ">50 yr ago, I had strept throat; had gotten lots and lots of PCN; last dose caused me to stop breathing" (02/17/2012)  . Tetracycline Other (See Comments)    "upset stomach" (02/17/2012)  . Sulfonamide Derivatives Rash and Other (See Comments)    "upset stomach" (02/17/2012)    Family History  Problem Relation Age of Onset  . Diabetes Mother     BP 132/84 mmHg  Pulse 98  Temp(Src) 98 F (36.7 C) (Oral)  Ht 5\' 3"  (1.6 m)  Wt 212 lb (96.163 kg)  BMI 37.56 kg/m2  SpO2 91%    Review of Systems She denies hypoglycemia and weight change.      Objective:   Physical Exam VITAL SIGNS:  See vs page GENERAL: no distress Pulses: dorsalis pedis intact bilat.   MSK: no deformity of the feet, except for bilat hammer toes.  CV: no leg edema Skin:  no ulcer on the feet.  normal color and temp on the feet. Neuro: sensation is intact to touch on the feet, but decreased from normal.   Ext: There is bilateral onychomycosis of the toenails.    Lab Results  Component Value Date   HGBA1C 6.6* 02/21/2014        Assessment & Plan:  DM: well-controlled  Patient is advised the following: Patient Instructions  A diabetes blood test is requested for you today.  We'll contact you with results.   Please make a follow-up appointment in 3 months.  check your blood sugar 2 times a day.  vary the time of day when you check, between before the 3 meals, and at bedtime.  also check if you have symptoms of your blood sugar being too high or too low.  please keep a record of the readings and bring it to your next appointment here.  please call us sooner if your blood sugar goes below 70, or if it stays over 200.   Please continue the same insulin. take 5 units of regular insulin at bedtime, if you eat then.

## 2014-02-21 NOTE — Patient Instructions (Addendum)
A diabetes blood test is requested for you today.  We'll contact you with results.   Please make a follow-up appointment in 3 months.  check your blood sugar 2 times a day.  vary the time of day when you check, between before the 3 meals, and at bedtime.  also check if you have symptoms of your blood sugar being too high or too low.  please keep a record of the readings and bring it to your next appointment here.  please call us sooner if your blood sugar goes below 70, or if it stays over 200.   Please continue the same insulin. take 5 units of regular insulin at bedtime, if you eat then.

## 2014-02-22 ENCOUNTER — Other Ambulatory Visit: Payer: Self-pay

## 2014-02-22 ENCOUNTER — Other Ambulatory Visit: Payer: Medicare Other

## 2014-02-22 ENCOUNTER — Ambulatory Visit: Payer: Medicare Other | Admitting: Endocrinology

## 2014-02-22 DIAGNOSIS — E1049 Type 1 diabetes mellitus with other diabetic neurological complication: Secondary | ICD-10-CM

## 2014-02-22 DIAGNOSIS — IMO0002 Reserved for concepts with insufficient information to code with codable children: Secondary | ICD-10-CM

## 2014-02-22 DIAGNOSIS — E1041 Type 1 diabetes mellitus with diabetic mononeuropathy: Secondary | ICD-10-CM | POA: Diagnosis not present

## 2014-02-22 DIAGNOSIS — N6001 Solitary cyst of right breast: Secondary | ICD-10-CM | POA: Diagnosis not present

## 2014-02-22 DIAGNOSIS — E1065 Type 1 diabetes mellitus with hyperglycemia: Principal | ICD-10-CM

## 2014-02-22 DIAGNOSIS — R928 Other abnormal and inconclusive findings on diagnostic imaging of breast: Secondary | ICD-10-CM | POA: Diagnosis not present

## 2014-02-22 LAB — MICROALBUMIN / CREATININE URINE RATIO
Creatinine,U: 126.8 mg/dL
MICROALB/CREAT RATIO: 4.3 mg/g (ref 0.0–30.0)
Microalb, Ur: 5.5 mg/dL — ABNORMAL HIGH (ref 0.0–1.9)

## 2014-03-14 ENCOUNTER — Telehealth: Payer: Self-pay | Admitting: Endocrinology

## 2014-03-14 ENCOUNTER — Other Ambulatory Visit: Payer: Self-pay | Admitting: Endocrinology

## 2014-03-14 MED ORDER — GABAPENTIN 300 MG PO CAPS: 300.0000 mg | ORAL_CAPSULE | Freq: Two times a day (BID) | ORAL | Status: DC

## 2014-03-14 MED ORDER — GABAPENTIN 300 MG PO CAPS
300.0000 mg | ORAL_CAPSULE | Freq: Two times a day (BID) | ORAL | Status: DC
Start: 2014-03-14 — End: 2014-03-14

## 2014-03-14 NOTE — Telephone Encounter (Signed)
Patient states due to the change in her insurance she will need a paper Rx for her gabapentin   Please advise   Thank you

## 2014-03-14 NOTE — Telephone Encounter (Signed)
Pt advised that rx is ready for pick up. Rx placed up front.  

## 2014-03-14 NOTE — Telephone Encounter (Signed)
See below, Thanks!  

## 2014-03-14 NOTE — Telephone Encounter (Signed)
i printed 

## 2014-04-04 DIAGNOSIS — M5416 Radiculopathy, lumbar region: Secondary | ICD-10-CM | POA: Diagnosis not present

## 2014-04-04 DIAGNOSIS — M545 Low back pain: Secondary | ICD-10-CM | POA: Diagnosis not present

## 2014-04-20 ENCOUNTER — Telehealth: Payer: Self-pay | Admitting: Interventional Cardiology

## 2014-04-20 NOTE — Telephone Encounter (Signed)
New message    Patient calling   Works for the city of [employer] .    She has been issued a name tag - held in place by magnet  H/o stent .    Can she wear the name tag without any problems .

## 2014-04-20 NOTE — Telephone Encounter (Signed)
Returned pt call. Adv her that it is safe for her to wear her magnet name tag. She verbalized understanding.

## 2014-05-04 DIAGNOSIS — M545 Low back pain: Secondary | ICD-10-CM | POA: Diagnosis not present

## 2014-05-04 DIAGNOSIS — M4727 Other spondylosis with radiculopathy, lumbosacral region: Secondary | ICD-10-CM | POA: Diagnosis not present

## 2014-05-24 ENCOUNTER — Ambulatory Visit: Payer: Medicare Other | Admitting: Endocrinology

## 2014-06-07 ENCOUNTER — Encounter: Payer: Self-pay | Admitting: Interventional Cardiology

## 2014-06-07 ENCOUNTER — Ambulatory Visit (INDEPENDENT_AMBULATORY_CARE_PROVIDER_SITE_OTHER): Payer: Medicare Other | Admitting: Interventional Cardiology

## 2014-06-07 VITALS — BP 136/68 | HR 81 | Ht 63.0 in | Wt 208.8 lb

## 2014-06-07 DIAGNOSIS — I10 Essential (primary) hypertension: Secondary | ICD-10-CM

## 2014-06-07 DIAGNOSIS — G4730 Sleep apnea, unspecified: Secondary | ICD-10-CM

## 2014-06-07 DIAGNOSIS — E785 Hyperlipidemia, unspecified: Secondary | ICD-10-CM

## 2014-06-07 DIAGNOSIS — I2510 Atherosclerotic heart disease of native coronary artery without angina pectoris: Secondary | ICD-10-CM | POA: Diagnosis not present

## 2014-06-07 NOTE — Progress Notes (Signed)
Cardiology Office Note   Date:  06/07/2014   ID:  Aisia, Correira August 07, 1945, MRN [id number]  PCP:  Anthoney Harada, MD  Cardiologist:   Sinclair Grooms, MD   Chief Complaint  Patient presents with  . Coronary Artery Disease      History of Present Illness: SHAWNETTA LEIN is a 69 y.o. female who presents for obtuse marginal #1 DES 2014, hypertension, obstructive sleep apnea, and hyperlipidemia.  The patient is asymptomatic with reference to any anginal complaints. She has not used nitroglycerin. She is now in [facility] and doing well.    Past Medical History  Diagnosis Date  . Depression   . Hyperlipidemia   . Hypertension   . Diverticulitis   . History of transient ischemic attack (TIA) 2001    "possibly; never really pinned down what it was" (02/17/2012)  . Pneumonia 12/2010  . Bronchitis     "touch q now and then" (02/17/2012)  . Shortness of breath     "occasionally; at any time" (02/17/2012)  . OSA on CPAP   . Type II diabetes mellitus   . History of blood transfusion 1945    "when I was 22 days old" (02/17/2012)  . Migraines     "outgrew them I guess" (02/17/2012)    Past Surgical History  Procedure Laterality Date  . Cardiac catheterization  02/13/2012  . Coronary angioplasty with stent placement  02/17/2012    "1; first one ever" (02/17/2012)  . Left oophorectomy  1989  . Colostomy  01/18/1988  . Colostomy reversal  04/1988  . Appendectomy  04/1988  . Colectomy  01/18/1988    "took 3 feet out; result of lots of burst diverticula" (02/17/2012)  . Colectomy  04/1988    "9 more inches of colon" (02/17/2012)  . Abdominal hysterectomy  2000's  . Percutaneous coronary stent intervention (pci-s) N/A 02/17/2012    Procedure: PERCUTANEOUS CORONARY STENT INTERVENTION (PCI-S);  Surgeon: Sinclair Grooms, MD;  Location: [facility] CATH LAB;  Service: Cardiovascular;  Laterality: N/A;     Current Outpatient Prescriptions  Medication Sig Dispense Refill  . albuterol (PROAIR HFA) 108  (90 BASE) MCG/ACT inhaler Inhale 2 puffs into the lungs every 4 (four) hours as needed. For shortness of breath    . aspirin 325 MG tablet Take 1 tablet (325 mg total) by mouth daily.    Marland Kitchen atorvastatin (LIPITOR) 10 MG tablet Take 10 mg by mouth daily.    Marland Kitchen gabapentin (NEURONTIN) 300 MG capsule Take 1 capsule (300 mg total) by mouth 2 (two) times daily. 180 capsule 3  . Insulin Pen Needle (BD ULTRA-FINE PEN NEEDLES) 29G X 12.7MM MISC by Does not apply route 3 (three) times daily.      . insulin regular (NOVOLIN R,HUMULIN R) 100 units/mL injection 3 times a day (just before each meal), 25-35-45 units, and syringes 4/month.    . losartan-hydrochlorothiazide (HYZAAR) 50-12.5 MG per tablet Take 1 tablet by mouth daily.    . meloxicam (MOBIC) 7.5 MG tablet Take 7.5 mg by mouth 2 (two) times daily.    . Multiple Vitamin (MULTIVITAMIN WITH MINERALS) TABS Take 1 tablet by mouth daily.    . nitroGLYCERIN (NITROSTAT) 0.4 MG SL tablet Place 1 tablet (0.4 mg total) under the tongue every 5 (five) minutes as needed for chest pain. 25 tablet 3  . traMADol (ULTRAM) 50 MG tablet Take by mouth every 6 (six) hours as needed for moderate pain.     Marland Kitchen  insulin NPH (HUMULIN N,NOVOLIN N) 100 UNIT/ML injection Inject 60 Units into the skin at bedtime.     . [DISCONTINUED] telmisartan-hydrochlorothiazide (MICARDIS HCT) 80-12.5 MG per tablet Take 1 tablet by mouth daily.       No current facility-administered medications for this visit.    Allergies:   Erythromycin; Penicillins; Tetracycline; and Sulfonamide derivatives    Social History:  The patient  reports that she quit smoking about 44 years ago. Her smoking use included Cigarettes. She quit after 2 years of use. She has never used smokeless tobacco. She reports that she does not drink alcohol or use illicit drugs.   Family History:  The patient's family history includes Angina in her mother; Diabetes in her mother; Lung cancer in her father; Other in her sister.     ROS:  Please see the history of present illness.   Otherwise, review of systems are positive for excessive daytime sleepiness..   All other systems are reviewed and negative.    PHYSICAL EXAM: VS:  BP 136/68 mmHg  Pulse 81  Ht 5\' 3"  (1.6 m)  Wt 208 lb 12.8 oz (94.711 kg)  BMI 37.00 kg/m2 , BMI Body mass index is 37 kg/(m^2). GEN: Well nourished, well developed, in no acute distress HEENT: normal Neck: no JVD, carotid bruits, or masses Cardiac: RRR; no murmurs, rubs, or gallops,no edema  Respiratory:  clear to auscultation bilaterally, normal work of breathing GI: soft, nontender, nondistended, + BS MS: no deformity or atrophy Skin: warm and dry, no rash Neuro:  Strength and sensation are intact Psych: euthymic mood, full affect   EKG:  EKG is ordered today. The ekg ordered today demonstrates normal   Recent Labs: No results found for requested labs within last 365 days.    Lipid Panel No results found for: CHOL, TRIG, HDL, CHOLHDL, VLDL, LDLCALC, LDLDIRECT    Wt Readings from Last 3 Encounters:  06/07/14 208 lb 12.8 oz (94.711 kg)  02/21/14 212 lb (96.163 kg)  11/23/13 213 lb (96.616 kg)      Other studies Reviewed: Additional studies/ records that were reviewed today include: . Review of the above records demonstrates:    ASSESSMENT AND PLAN:  Atherosclerosis of native coronary artery of native heart without angina pectoris: Stable without angina  Essential hypertension: Controlled  Hyperlipidemia: Controlled  Sleep apnea    Current medicines are reviewed at length with the patient today.  The patient does not have concerns regarding medicines.  The following changes have been made:  no change  Labs/ tests ordered today include:   Orders Placed This Encounter  Procedures  . EKG 12-Lead     Disposition:   FU with HS in 1 year  Signed, Sinclair Grooms, MD  06/07/2014 2:16 PM    [facility] [address],  [place], [place]  [address] Phone: [phone]; Fax: [phone]

## 2014-06-07 NOTE — Patient Instructions (Signed)

## 2014-06-22 ENCOUNTER — Encounter: Payer: Self-pay | Admitting: *Deleted

## 2014-06-22 ENCOUNTER — Other Ambulatory Visit: Payer: Self-pay | Admitting: *Deleted

## 2014-06-22 NOTE — Patient Outreach (Signed)
[facility]) Care Management  [facility] Social Work  06/22/2014  Brittany Rojas 1945-03-20 [id number]  Subjective:    Objective:   Current Medications:  Current Outpatient Prescriptions  Medication Sig Dispense Refill  . albuterol (PROAIR HFA) 108 (90 BASE) MCG/ACT inhaler Inhale 2 puffs into the lungs every 4 (four) hours as needed. For shortness of breath    . aspirin 325 MG tablet Take 1 tablet (325 mg total) by mouth daily.    Marland Kitchen atorvastatin (LIPITOR) 10 MG tablet Take 10 mg by mouth daily.    Marland Kitchen gabapentin (NEURONTIN) 300 MG capsule Take 1 capsule (300 mg total) by mouth 2 (two) times daily. 180 capsule 3  . insulin NPH (HUMULIN N,NOVOLIN N) 100 UNIT/ML injection Inject 60 Units into the skin at bedtime.     . Insulin Pen Needle (BD ULTRA-FINE PEN NEEDLES) 29G X 12.7MM MISC by Does not apply route 3 (three) times daily.      . insulin regular (NOVOLIN R,HUMULIN R) 100 units/mL injection 3 times a day (just before each meal), 25-35-45 units, and syringes 4/month.    . losartan-hydrochlorothiazide (HYZAAR) 50-12.5 MG per tablet Take 1 tablet by mouth daily.    . meloxicam (MOBIC) 7.5 MG tablet Take 7.5 mg by mouth 2 (two) times daily.    . Multiple Vitamin (MULTIVITAMIN WITH MINERALS) TABS Take 1 tablet by mouth daily.    . nitroGLYCERIN (NITROSTAT) 0.4 MG SL tablet Place 1 tablet (0.4 mg total) under the tongue every 5 (five) minutes as needed for chest pain. 25 tablet 3  . traMADol (ULTRAM) 50 MG tablet Take by mouth every 6 (six) hours as needed for moderate pain.     . [DISCONTINUED] telmisartan-hydrochlorothiazide (MICARDIS HCT) 80-12.5 MG per tablet Take 1 tablet by mouth daily.       No current facility-administered medications for this visit.    Functional Status:  In your present state of health, do you have any difficulty performing the following activities: 06/22/2014  Hearing? N  Vision? N  Difficulty concentrating or making decisions? N  Walking or  climbing stairs? N  Dressing or bathing? N  Doing errands, shopping? N  Preparing Food and eating ? N  Using the Toilet? N  In the past six months, have you accidently leaked urine? N  Do you have problems with loss of bowel control? N  Managing your Medications? N  Managing your Finances? N  Housekeeping or managing your Housekeeping? N    Fall/Depression Screening:  PHQ 2/9 Scores 06/22/2014  PHQ - 2 Score 0    Assessment:   CSW was able to make initial contact with patient today to perform phone assessment, as well as assess and assist with social work needs and services.  CSW introduced self, explained role and types of services provided through [employer].  CSW went on to explain the reason for the call, indicating that CSW received a referral from previous social work Social worker, to assist patient with possible referrals to community agencies and resources.  CSW was able to obtain two HIPAA compliant identifiers from patient, which included her name and date of birth, and also obtained verbal consent from patient to converse with CSW. CSW is aware that patient currently lives at home with her husband, Brittany Rojas, who is also patient's point of contact and primary caregiver.  Patient has been diagnosed with Type II Diabetes, Hypertension, Hyperlipidemia, Obstructive Sleep Apnea, Shortness of Breath and Depression, just to name  a few.  CSW inquired as to whether or not patient would be interested in receiving counseling and supportive services for systems of Depression.  Patient denied.  CSW also inquired as to whether or not patient would be interested in [facility] making a referral for patient to receive psychiatric services and/or counseling through an agency.  Again, patient denied. Patient admitted that her Depression is definitely something that she is able to manage on her own, not requiring counseling or psychotropic medications. Patient admits that she is  unable to identify any social work specific needs at present.  Patient indicated that she has a good support system, that she has transportation to and from all her physician appointments and that she is able to afford her prescription medications.  Patient went on to say that she does not require home care services or assistance in the home and that she is not interested in completing her Advanced Directives ([place]) documents.  CSW explained to patient that if no social work needs are identified at this time, then CSW will plan to close patient's case.  Patient voiced understanding and was in agreement with this plan.  CSW provided patient with CSW's contact information, encouraging patient to contact CSW directly if social work needs arise in the future.   Plan:   No additional social work needs have been identified at present; therefore, CSW will perform a case closure on patient. CSW will mail prescribed and printed EMMI information for patient to patient's home for her review. CSW will fax a correspondence letter to patient's Primary Care Physician, Dr. Yaakov Guthrie to ensure that Dr. Jacelyn Grip is aware of CSW's involvement with patient, also notifying Dr. Jacelyn Grip of CSW's plans to close patient's case. CSW will submit a case closure request to Lurline Del, Care Management Assistant with [facility], in the form of an [employer].  [name], BSW, MSW, [facility] [place], [place], [place] [id number] [email or web address] [phone]

## 2014-06-23 ENCOUNTER — Encounter: Payer: Self-pay | Admitting: *Deleted

## 2014-06-23 NOTE — Patient Outreach (Signed)
[facility]) Care Management  06/23/2014  Brittany Rojas 1946-02-11 [id number]   Received notification from [name], CSW to close case as no CSW needs identified at this time.  [name]. [name] CM Assistant Phone: [phone] Fax: [phone]

## 2014-06-28 ENCOUNTER — Encounter: Payer: Self-pay | Admitting: Endocrinology

## 2014-06-28 ENCOUNTER — Ambulatory Visit (INDEPENDENT_AMBULATORY_CARE_PROVIDER_SITE_OTHER): Payer: Medicare Other | Admitting: Endocrinology

## 2014-06-28 VITALS — BP 134/84 | HR 81 | Temp 98.1°F | Ht 63.0 in | Wt 208.0 lb

## 2014-06-28 DIAGNOSIS — I2510 Atherosclerotic heart disease of native coronary artery without angina pectoris: Secondary | ICD-10-CM | POA: Diagnosis not present

## 2014-06-28 DIAGNOSIS — E1065 Type 1 diabetes mellitus with hyperglycemia: Principal | ICD-10-CM

## 2014-06-28 DIAGNOSIS — E1049 Type 1 diabetes mellitus with other diabetic neurological complication: Secondary | ICD-10-CM

## 2014-06-28 DIAGNOSIS — E1041 Type 1 diabetes mellitus with diabetic mononeuropathy: Secondary | ICD-10-CM | POA: Diagnosis not present

## 2014-06-28 DIAGNOSIS — IMO0002 Reserved for concepts with insufficient information to code with codable children: Secondary | ICD-10-CM

## 2014-06-28 LAB — HEMOGLOBIN A1C: HEMOGLOBIN A1C: 6.5 % (ref 4.6–6.5)

## 2014-06-28 NOTE — Progress Notes (Signed)
Subjective:    Patient ID: Brittany Rojas, female    DOB: 1945/11/27, 69 y.o.   MRN: [id number]  HPI Pt returns for f/u of diabetes mellitus: DM type: Insulin-requiring type 2 Dx'ed: 5465 Complications: polyneuropathy, TIA, and CAD. Therapy: insulin since 2005 GDM: never. DKA: never. Severe hypoglycemia: once, in 2011. Pancreatitis: never.   Other:  she takes multiple daily injections; she declines pump rx.  Interval history: pt states she feels well in general.  no cbg record, but states cbg's vary from 55-150.  It is lowest at HS.   pt states she feels well in general.   Past Medical History  Diagnosis Date  . Depression   . Hyperlipidemia   . Hypertension   . Diverticulitis   . History of transient ischemic attack (TIA) 2001    "possibly; never really pinned down what it was" (02/17/2012)  . Pneumonia 12/2010  . Bronchitis     "touch q now and then" (02/17/2012)  . Shortness of breath     "occasionally; at any time" (02/17/2012)  . OSA on CPAP   . Type II diabetes mellitus   . History of blood transfusion 1945    "when I was 50 days old" (02/17/2012)  . Migraines     "outgrew them I guess" (02/17/2012)    Past Surgical History  Procedure Laterality Date  . Cardiac catheterization  02/13/2012  . Coronary angioplasty with stent placement  02/17/2012    "1; first one ever" (02/17/2012)  . Left oophorectomy  1989  . Colostomy  01/18/1988  . Colostomy reversal  04/1988  . Appendectomy  04/1988  . Colectomy  01/18/1988    "took 3 feet out; result of lots of burst diverticula" (02/17/2012)  . Colectomy  04/1988    "9 more inches of colon" (02/17/2012)  . Abdominal hysterectomy  2000's  . Percutaneous coronary stent intervention (pci-s) N/A 02/17/2012    Procedure: PERCUTANEOUS CORONARY STENT INTERVENTION (PCI-S);  Surgeon: Sinclair Grooms, MD;  Location: [facility] CATH LAB;  Service: Cardiovascular;  Laterality: N/A;    History   Social History  . Marital Status: Married    Spouse Name: N/A    . Number of Children: N/A  . Years of Education: N/A   Occupational History  . Not on file.   Social History Main Topics  . Smoking status: Former Smoker -- 2 years    Types: Cigarettes    Quit date: 11/11/1969  . Smokeless tobacco: Never Used  . Alcohol Use: No  . Drug Use: No  . Sexual Activity: Not Currently   Other Topics Concern  . Not on file   Social History Narrative    Current Outpatient Prescriptions on File Prior to Visit  Medication Sig Dispense Refill  . albuterol (PROAIR HFA) 108 (90 BASE) MCG/ACT inhaler Inhale 2 puffs into the lungs every 4 (four) hours as needed. For shortness of breath    . aspirin 325 MG tablet Take 1 tablet (325 mg total) by mouth daily.    Marland Kitchen atorvastatin (LIPITOR) 10 MG tablet Take 10 mg by mouth daily.    Marland Kitchen gabapentin (NEURONTIN) 300 MG capsule Take 1 capsule (300 mg total) by mouth 2 (two) times daily. 180 capsule 3  . Insulin Pen Needle (BD ULTRA-FINE PEN NEEDLES) 29G X 12.7MM MISC by Does not apply route 3 (three) times daily.      . insulin regular (NOVOLIN R,HUMULIN R) 100 units/mL injection 3 times a day (just before each  meal), 25-35-40 units, and syringes 4/month.    . losartan-hydrochlorothiazide (HYZAAR) 50-12.5 MG per tablet Take 1 tablet by mouth daily.    . meloxicam (MOBIC) 7.5 MG tablet Take 7.5 mg by mouth 2 (two) times daily.    . Multiple Vitamin (MULTIVITAMIN WITH MINERALS) TABS Take 1 tablet by mouth daily.    . nitroGLYCERIN (NITROSTAT) 0.4 MG SL tablet Place 1 tablet (0.4 mg total) under the tongue every 5 (five) minutes as needed for chest pain. 25 tablet 3  . traMADol (ULTRAM) 50 MG tablet Take by mouth every 6 (six) hours as needed for moderate pain.     Marland Kitchen insulin NPH (HUMULIN N,NOVOLIN N) 100 UNIT/ML injection Inject 60 Units into the skin at bedtime.     . [DISCONTINUED] telmisartan-hydrochlorothiazide (MICARDIS HCT) 80-12.5 MG per tablet Take 1 tablet by mouth daily.       No current facility-administered  medications on file prior to visit.    Allergies  Allergen Reactions  . Erythromycin Other (See Comments)    "upset stomach" (02/17/2012)  . Penicillins Other (See Comments)    ">50 yr ago, I had strept throat; had gotten lots and lots of PCN; last dose caused me to stop breathing" (02/17/2012)  . Tetracycline Other (See Comments)    "upset stomach" (02/17/2012)  . Sulfonamide Derivatives Rash and Other (See Comments)    "upset stomach" (02/17/2012)    Family History  Problem Relation Age of Onset  . Diabetes Mother   . Angina Mother   . Lung cancer Father   . Other Sister     BRAIN TUMOR    BP 134/84 mmHg  Pulse 81  Temp(Src) 98.1 F (36.7 C) (Oral)  Ht 5\' 3"  (1.6 m)  Wt 208 lb (94.348 kg)  BMI 36.85 kg/m2  SpO2 93%  Review of Systems Denies LOC.  She has lost a few lbs.    Objective:   Physical Exam VITAL SIGNS:  See vs page GENERAL: no distress.  Morbid obesity.  Pulses: dorsalis pedis intact bilat.   MSK: no deformity of the feet, except for bilat hammer toes.  CV: no leg edema  Skin: no ulcer on the feet. normal color and temp on the feet.  Neuro: sensation is intact to touch on the feet, but decreased from normal.   Ext: There is bilateral onychomycosis of the toenails.  Lab Results  Component Value Date   HGBA1C 6.5 06/28/2014      Assessment & Plan:  DM: overcontrolled Obesity: persistent: she declines surgery  Patient is advised the following: Patient Instructions  A diabetes blood test is requested for you today.  We'll contact you with results.   Please reduce the regular insulin to 3 times a day (just before each meal), 25-35-40 units.   Please make a follow-up appointment in 3 months.   check your blood sugar 2 times a day.  vary the time of day when you check, between before the 3 meals, and at bedtime.  also check if you have symptoms of your blood sugar being too high or too low.  please keep a record of the readings and bring it to your next  appointment here.  please call us sooner if your blood sugar goes below 70, or if it stays over 200.

## 2014-06-28 NOTE — Patient Instructions (Addendum)
A diabetes blood test is requested for you today.  We'll contact you with results.   Please reduce the regular insulin to 3 times a day (just before each meal), 25-35-40 units.   Please make a follow-up appointment in 3 months.   check your blood sugar 2 times a day.  vary the time of day when you check, between before the 3 meals, and at bedtime.  also check if you have symptoms of your blood sugar being too high or too low.  please keep a record of the readings and bring it to your next appointment here.  please call us sooner if your blood sugar goes below 70, or if it stays over 200.

## 2014-07-04 DIAGNOSIS — M4726 Other spondylosis with radiculopathy, lumbar region: Secondary | ICD-10-CM | POA: Diagnosis not present

## 2014-07-04 DIAGNOSIS — M545 Low back pain: Secondary | ICD-10-CM | POA: Diagnosis not present

## 2014-07-05 DIAGNOSIS — H4011X1 Primary open-angle glaucoma, mild stage: Secondary | ICD-10-CM | POA: Diagnosis not present

## 2014-07-05 DIAGNOSIS — E119 Type 2 diabetes mellitus without complications: Secondary | ICD-10-CM | POA: Diagnosis not present

## 2014-07-13 DIAGNOSIS — E119 Type 2 diabetes mellitus without complications: Secondary | ICD-10-CM | POA: Diagnosis not present

## 2014-07-13 DIAGNOSIS — Z6838 Body mass index (BMI) 38.0-38.9, adult: Secondary | ICD-10-CM | POA: Diagnosis not present

## 2014-07-13 DIAGNOSIS — Z794 Long term (current) use of insulin: Secondary | ICD-10-CM | POA: Diagnosis not present

## 2014-07-13 DIAGNOSIS — E782 Mixed hyperlipidemia: Secondary | ICD-10-CM | POA: Diagnosis not present

## 2014-07-13 DIAGNOSIS — I2510 Atherosclerotic heart disease of native coronary artery without angina pectoris: Secondary | ICD-10-CM | POA: Diagnosis not present

## 2014-07-13 DIAGNOSIS — I10 Essential (primary) hypertension: Secondary | ICD-10-CM | POA: Diagnosis not present

## 2014-07-13 DIAGNOSIS — E669 Obesity, unspecified: Secondary | ICD-10-CM | POA: Diagnosis not present

## 2014-08-01 DIAGNOSIS — M4727 Other spondylosis with radiculopathy, lumbosacral region: Secondary | ICD-10-CM | POA: Diagnosis not present

## 2014-08-01 DIAGNOSIS — M545 Low back pain: Secondary | ICD-10-CM | POA: Diagnosis not present

## 2014-08-01 DIAGNOSIS — Z6836 Body mass index (BMI) 36.0-36.9, adult: Secondary | ICD-10-CM | POA: Diagnosis not present

## 2014-08-01 DIAGNOSIS — H4011X1 Primary open-angle glaucoma, mild stage: Secondary | ICD-10-CM | POA: Diagnosis not present

## 2014-08-01 DIAGNOSIS — M25519 Pain in unspecified shoulder: Secondary | ICD-10-CM | POA: Insufficient documentation

## 2014-08-19 DIAGNOSIS — L84 Corns and callosities: Secondary | ICD-10-CM | POA: Diagnosis not present

## 2014-08-19 DIAGNOSIS — E1151 Type 2 diabetes mellitus with diabetic peripheral angiopathy without gangrene: Secondary | ICD-10-CM | POA: Diagnosis not present

## 2014-08-19 DIAGNOSIS — L603 Nail dystrophy: Secondary | ICD-10-CM | POA: Diagnosis not present

## 2014-09-27 ENCOUNTER — Ambulatory Visit: Payer: Medicare Other | Admitting: Endocrinology

## 2014-09-27 ENCOUNTER — Encounter: Payer: Self-pay | Admitting: Endocrinology

## 2014-09-27 ENCOUNTER — Ambulatory Visit (INDEPENDENT_AMBULATORY_CARE_PROVIDER_SITE_OTHER): Payer: Medicare Other | Admitting: Endocrinology

## 2014-09-27 VITALS — BP 136/84 | HR 78 | Temp 97.8°F | Wt 195.0 lb

## 2014-09-27 DIAGNOSIS — I2510 Atherosclerotic heart disease of native coronary artery without angina pectoris: Secondary | ICD-10-CM | POA: Diagnosis not present

## 2014-09-27 DIAGNOSIS — E119 Type 2 diabetes mellitus without complications: Secondary | ICD-10-CM | POA: Diagnosis not present

## 2014-09-27 LAB — POCT GLYCOSYLATED HEMOGLOBIN (HGB A1C): HEMOGLOBIN A1C: 6.4

## 2014-09-27 NOTE — Progress Notes (Signed)
Subjective:    Patient ID: Brittany Rojas, female    DOB: Jan 21, 1946, 69 y.o.   MRN: [id number]  HPI Pt returns for f/u of diabetes mellitus: DM type: Insulin-requiring type 2 Dx'ed: 9518 Complications: polyneuropathy, TIA, and CAD. Therapy: insulin since 2005 GDM: never. DKA: never. Severe hypoglycemia: once, in 2011. Pancreatitis: never.   Other:  she takes multiple daily injections; she declines pump rx; she takes human insulin, due to cost.  Interval history: pt states she feels well in general.  she brings a record of her cbg's which i have reviewed today.  It varies from 70-200.  It is lowest at lunch and the afternoon.  Past Medical History  Diagnosis Date  . Depression   . Hyperlipidemia   . Hypertension   . Diverticulitis   . History of transient ischemic attack (TIA) 2001    "possibly; never really pinned down what it was" (02/17/2012)  . Pneumonia 12/2010  . Bronchitis     "touch q now and then" (02/17/2012)  . Shortness of breath     "occasionally; at any time" (02/17/2012)  . OSA on CPAP   . Type II diabetes mellitus   . History of blood transfusion 1945    "when I was 36 days old" (02/17/2012)  . Migraines     "outgrew them I guess" (02/17/2012)    Past Surgical History  Procedure Laterality Date  . Cardiac catheterization  02/13/2012  . Coronary angioplasty with stent placement  02/17/2012    "1; first one ever" (02/17/2012)  . Left oophorectomy  1989  . Colostomy  01/18/1988  . Colostomy reversal  04/1988  . Appendectomy  04/1988  . Colectomy  01/18/1988    "took 3 feet out; result of lots of burst diverticula" (02/17/2012)  . Colectomy  04/1988    "9 more inches of colon" (02/17/2012)  . Abdominal hysterectomy  2000's  . Percutaneous coronary stent intervention (pci-s) N/A 02/17/2012    Procedure: PERCUTANEOUS CORONARY STENT INTERVENTION (PCI-S);  Surgeon: Sinclair Grooms, MD;  [facility] CATH LAB;  Service: Cardiovascular;  Laterality: N/A;    Social History    Social History  . Marital Status: Married    Spouse Name: N/A  . Number of Children: N/A  . Years of Education: N/A   Occupational History  . Not on file.   Social History Main Topics  . Smoking status: Former Smoker -- 2 years    Types: Cigarettes    Quit date: 11/11/1969  . Smokeless tobacco: Never Used  . Alcohol Use: No  . Drug Use: No  . Sexual Activity: Not Currently   Other Topics Concern  . Not on file   Social History Narrative    Current Outpatient Prescriptions on File Prior to Visit  Medication Sig Dispense Refill  . albuterol (PROAIR HFA) 108 (90 BASE) MCG/ACT inhaler Inhale 2 puffs into the lungs every 4 (four) hours as needed. For shortness of breath    . aspirin 325 MG tablet Take 1 tablet (325 mg total) by mouth daily.    Marland Kitchen atorvastatin (LIPITOR) 10 MG tablet Take 10 mg by mouth daily.    Marland Kitchen gabapentin (NEURONTIN) 300 MG capsule Take 1 capsule (300 mg total) by mouth 2 (two) times daily. 180 capsule 3  . Insulin Pen Needle (BD ULTRA-FINE PEN NEEDLES) 29G X 12.7MM MISC by Does not apply route 3 (three) times daily.      . insulin regular (NOVOLIN R,HUMULIN R) 100 units/mL  injection 3 times a day (just before each meal), 15-25-30 units, and syringes 4/month.    . losartan-hydrochlorothiazide (HYZAAR) 50-12.5 MG per tablet Take 1 tablet by mouth daily.    . Multiple Vitamin (MULTIVITAMIN WITH MINERALS) TABS Take 1 tablet by mouth daily.    . nitroGLYCERIN (NITROSTAT) 0.4 MG SL tablet Place 1 tablet (0.4 mg total) under the tongue every 5 (five) minutes as needed for chest pain. 25 tablet 3  . traMADol (ULTRAM) 50 MG tablet Take by mouth every 6 (six) hours as needed for moderate pain.     Marland Kitchen insulin NPH (HUMULIN N,NOVOLIN N) 100 UNIT/ML injection Inject 50 Units into the skin at bedtime.     . meloxicam (MOBIC) 7.5 MG tablet Take 7.5 mg by mouth 2 (two) times daily.    . [DISCONTINUED] telmisartan-hydrochlorothiazide (MICARDIS HCT) 80-12.5 MG per tablet Take 1  tablet by mouth daily.       No current facility-administered medications on file prior to visit.    Allergies  Allergen Reactions  . Erythromycin Other (See Comments)    "upset stomach" (02/17/2012)  . Penicillins Other (See Comments)    ">50 yr ago, I had strept throat; had gotten lots and lots of PCN; last dose caused me to stop breathing" (02/17/2012)  . Tetracycline Other (See Comments)    "upset stomach" (02/17/2012)  . Sulfonamide Derivatives Rash and Other (See Comments)    "upset stomach" (02/17/2012)    Family History  Problem Relation Age of Onset  . Diabetes Mother   . Angina Mother   . Lung cancer Father   . Other Sister     BRAIN TUMOR    BP 136/84 mmHg  Pulse 78  Temp(Src) 97.8 F (36.6 C) (Oral)  Wt 195 lb (88.451 kg)  SpO2 95%    Review of Systems She has lost a few lbs, due to her efforts.    Objective:   Physical Exam VITAL SIGNS:  See vs page GENERAL: no distress Pulses: dorsalis pedis intact bilat.   MSK: no deformity of the feet, except for bilat hammer toes.  CV: no leg edema  Skin: no ulcer on the feet. normal color and temp on the feet.  Neuro: sensation is intact to touch on the feet, but decreased from normal.   Ext: There is bilateral onychomycosis of the toenails.    Lab Results  Component Value Date   HGBA1C 6.4 09/27/2014      Assessment & Plan:  DM: slightly overcontrolled.  Patient is advised the following: Patient Instructions  Please reduce the regular insulin to 3 times a day (just before each meal), 15-25-30 units.   Please reduce the NPH to 50 units at bedtime Please make a follow-up appointment in 4 months.   check your blood sugar 2 times a day.  vary the time of day when you check, between before the 3 meals, and at bedtime.  also check if you have symptoms of your blood sugar being too high or too low.  please keep a record of the readings and bring it to your next appointment here.  please call us sooner if your  blood sugar goes below 70, or if it stays over 200.

## 2014-09-27 NOTE — Patient Instructions (Addendum)
Please reduce the regular insulin to 3 times a day (just before each meal), 15-25-30 units.   Please reduce the NPH to 50 units at bedtime Please make a follow-up appointment in 4 months.   check your blood sugar 2 times a day.  vary the time of day when you check, between before the 3 meals, and at bedtime.  also check if you have symptoms of your blood sugar being too high or too low.  please keep a record of the readings and bring it to your next appointment here.  please call us sooner if your blood sugar goes below 70, or if it stays over 200.

## 2014-10-28 DIAGNOSIS — M4727 Other spondylosis with radiculopathy, lumbosacral region: Secondary | ICD-10-CM | POA: Diagnosis not present

## 2014-10-28 DIAGNOSIS — M545 Low back pain: Secondary | ICD-10-CM | POA: Diagnosis not present

## 2014-10-28 DIAGNOSIS — I10 Essential (primary) hypertension: Secondary | ICD-10-CM | POA: Diagnosis not present

## 2014-10-28 DIAGNOSIS — Z6834 Body mass index (BMI) 34.0-34.9, adult: Secondary | ICD-10-CM | POA: Diagnosis not present

## 2014-11-08 DIAGNOSIS — Z23 Encounter for immunization: Secondary | ICD-10-CM | POA: Diagnosis not present

## 2014-12-15 DIAGNOSIS — D2372 Other benign neoplasm of skin of left lower limb, including hip: Secondary | ICD-10-CM | POA: Diagnosis not present

## 2014-12-15 DIAGNOSIS — D2371 Other benign neoplasm of skin of right lower limb, including hip: Secondary | ICD-10-CM | POA: Diagnosis not present

## 2014-12-29 DIAGNOSIS — D2371 Other benign neoplasm of skin of right lower limb, including hip: Secondary | ICD-10-CM | POA: Diagnosis not present

## 2015-01-16 ENCOUNTER — Telehealth: Payer: Self-pay | Admitting: Endocrinology

## 2015-01-16 NOTE — Telephone Encounter (Signed)
Noted  

## 2015-01-16 NOTE — Telephone Encounter (Signed)
Patient stated that the diabetic supply company will be sending you a fax for her Diabetic supplys.

## 2015-01-17 DIAGNOSIS — H401131 Primary open-angle glaucoma, bilateral, mild stage: Secondary | ICD-10-CM | POA: Diagnosis not present

## 2015-01-24 ENCOUNTER — Ambulatory Visit: Payer: Medicare Other | Admitting: Endocrinology

## 2015-01-31 DIAGNOSIS — G4733 Obstructive sleep apnea (adult) (pediatric): Secondary | ICD-10-CM | POA: Diagnosis not present

## 2015-02-09 DIAGNOSIS — D2371 Other benign neoplasm of skin of right lower limb, including hip: Secondary | ICD-10-CM | POA: Diagnosis not present

## 2015-02-14 ENCOUNTER — Encounter: Payer: Self-pay | Admitting: Endocrinology

## 2015-02-14 ENCOUNTER — Ambulatory Visit (INDEPENDENT_AMBULATORY_CARE_PROVIDER_SITE_OTHER): Payer: Medicare Other | Admitting: Endocrinology

## 2015-02-14 VITALS — BP 154/62 | HR 82 | Temp 98.7°F | Ht 63.0 in | Wt 189.0 lb

## 2015-02-14 DIAGNOSIS — E1065 Type 1 diabetes mellitus with hyperglycemia: Secondary | ICD-10-CM | POA: Diagnosis not present

## 2015-02-14 DIAGNOSIS — IMO0002 Reserved for concepts with insufficient information to code with codable children: Secondary | ICD-10-CM

## 2015-02-14 DIAGNOSIS — E1040 Type 1 diabetes mellitus with diabetic neuropathy, unspecified: Secondary | ICD-10-CM

## 2015-02-14 LAB — POCT GLYCOSYLATED HEMOGLOBIN (HGB A1C): Hemoglobin A1C: 6.3
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
## 2015-02-14 NOTE — Patient Instructions (Addendum)
Please reduce the regular insulin to 3 times a day (just before each meal), 15-20-25 units, and: Please continue the same NPH at bedtime.   Please call us next week, to tell us how the blood sugar is doing.   Please make a follow-up appointment in 2 months.   check your blood sugar 2 times a day.  vary the time of day when you check, between before the 3 meals, and at bedtime.  also check if you have symptoms of your blood sugar being too high or too low.  please keep a record of the readings and bring it to your next appointment here.  please call us sooner if your blood sugar goes below 70, or if it stays over 200.

## 2015-04-04 ENCOUNTER — Other Ambulatory Visit: Payer: Self-pay | Admitting: Endocrinology

## 2015-04-04 DIAGNOSIS — M5416 Radiculopathy, lumbar region: Secondary | ICD-10-CM | POA: Diagnosis not present

## 2015-04-04 DIAGNOSIS — M4727 Other spondylosis with radiculopathy, lumbosacral region: Secondary | ICD-10-CM | POA: Diagnosis not present

## 2015-04-11 DIAGNOSIS — Z1231 Encounter for screening mammogram for malignant neoplasm of breast: Secondary | ICD-10-CM | POA: Diagnosis not present

## 2015-04-18 ENCOUNTER — Encounter: Payer: Self-pay | Admitting: Endocrinology

## 2015-04-18 ENCOUNTER — Ambulatory Visit (INDEPENDENT_AMBULATORY_CARE_PROVIDER_SITE_OTHER): Payer: Medicare Other | Admitting: Endocrinology

## 2015-04-18 VITALS — BP 138/76 | HR 87 | Temp 97.7°F | Ht 63.0 in | Wt 186.0 lb

## 2015-04-18 DIAGNOSIS — E1065 Type 1 diabetes mellitus with hyperglycemia: Secondary | ICD-10-CM

## 2015-04-18 DIAGNOSIS — E1151 Type 2 diabetes mellitus with diabetic peripheral angiopathy without gangrene: Secondary | ICD-10-CM | POA: Diagnosis not present

## 2015-04-18 DIAGNOSIS — Z794 Long term (current) use of insulin: Secondary | ICD-10-CM | POA: Diagnosis not present

## 2015-04-18 DIAGNOSIS — E1040 Type 1 diabetes mellitus with diabetic neuropathy, unspecified: Secondary | ICD-10-CM

## 2015-04-18 DIAGNOSIS — IMO0002 Reserved for concepts with insufficient information to code with codable children: Secondary | ICD-10-CM

## 2015-04-18 LAB — MICROALBUMIN / CREATININE URINE RATIO
Creatinine,U: 129.2 mg/dL
MICROALB UR: 1.5 mg/dL (ref 0.0–1.9)
MICROALB/CREAT RATIO: 1.2 mg/g (ref 0.0–30.0)

## 2015-04-18 LAB — POCT GLYCOSYLATED HEMOGLOBIN (HGB A1C): Hemoglobin A1C: 6.9
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
## 2015-04-18 NOTE — Patient Instructions (Addendum)
Please continue the same insulins.  Please make a follow-up appointment in 4 months.   check your blood sugar 2 times a day.  vary the time of day when you check, between before the 3 meals, and at bedtime.  also check if you have symptoms of your blood sugar being too high or too low.  please keep a record of the readings and bring it to your next appointment here.  please call us sooner if your blood sugar goes below 70, or if it stays over 200.      

## 2015-04-25 DIAGNOSIS — M4727 Other spondylosis with radiculopathy, lumbosacral region: Secondary | ICD-10-CM | POA: Diagnosis not present

## 2015-04-25 DIAGNOSIS — M545 Low back pain: Secondary | ICD-10-CM | POA: Diagnosis not present

## 2015-04-25 DIAGNOSIS — I10 Essential (primary) hypertension: Secondary | ICD-10-CM | POA: Diagnosis not present

## 2015-04-25 DIAGNOSIS — Z6834 Body mass index (BMI) 34.0-34.9, adult: Secondary | ICD-10-CM | POA: Diagnosis not present

## 2015-05-04 DIAGNOSIS — M4727 Other spondylosis with radiculopathy, lumbosacral region: Secondary | ICD-10-CM | POA: Diagnosis not present

## 2015-05-04 DIAGNOSIS — M545 Low back pain: Secondary | ICD-10-CM | POA: Diagnosis not present

## 2015-05-04 DIAGNOSIS — Z6834 Body mass index (BMI) 34.0-34.9, adult: Secondary | ICD-10-CM | POA: Diagnosis not present

## 2015-05-31 DIAGNOSIS — M4727 Other spondylosis with radiculopathy, lumbosacral region: Secondary | ICD-10-CM | POA: Diagnosis not present

## 2015-05-31 DIAGNOSIS — M545 Low back pain: Secondary | ICD-10-CM | POA: Diagnosis not present

## 2015-06-06 DIAGNOSIS — R42 Dizziness and giddiness: Secondary | ICD-10-CM | POA: Diagnosis not present

## 2015-06-06 DIAGNOSIS — E119 Type 2 diabetes mellitus without complications: Secondary | ICD-10-CM | POA: Diagnosis not present

## 2015-06-06 DIAGNOSIS — E782 Mixed hyperlipidemia: Secondary | ICD-10-CM | POA: Diagnosis not present

## 2015-06-06 DIAGNOSIS — I10 Essential (primary) hypertension: Secondary | ICD-10-CM | POA: Diagnosis not present

## 2015-06-06 DIAGNOSIS — I2510 Atherosclerotic heart disease of native coronary artery without angina pectoris: Secondary | ICD-10-CM | POA: Diagnosis not present

## 2015-06-06 DIAGNOSIS — G4733 Obstructive sleep apnea (adult) (pediatric): Secondary | ICD-10-CM | POA: Diagnosis not present

## 2015-06-06 DIAGNOSIS — M21961 Unspecified acquired deformity of right lower leg: Secondary | ICD-10-CM | POA: Diagnosis not present

## 2015-06-06 DIAGNOSIS — E6609 Other obesity due to excess calories: Secondary | ICD-10-CM | POA: Diagnosis not present

## 2015-06-06 DIAGNOSIS — H6123 Impacted cerumen, bilateral: Secondary | ICD-10-CM | POA: Diagnosis not present

## 2015-06-06 DIAGNOSIS — M5126 Other intervertebral disc displacement, lumbar region: Secondary | ICD-10-CM | POA: Diagnosis not present

## 2015-06-20 DIAGNOSIS — E782 Mixed hyperlipidemia: Secondary | ICD-10-CM | POA: Diagnosis not present

## 2015-06-20 DIAGNOSIS — G4733 Obstructive sleep apnea (adult) (pediatric): Secondary | ICD-10-CM | POA: Diagnosis not present

## 2015-06-20 DIAGNOSIS — E119 Type 2 diabetes mellitus without complications: Secondary | ICD-10-CM | POA: Diagnosis not present

## 2015-06-20 DIAGNOSIS — R42 Dizziness and giddiness: Secondary | ICD-10-CM | POA: Diagnosis not present

## 2015-06-20 DIAGNOSIS — H6123 Impacted cerumen, bilateral: Secondary | ICD-10-CM | POA: Diagnosis not present

## 2015-06-20 DIAGNOSIS — Z794 Long term (current) use of insulin: Secondary | ICD-10-CM | POA: Diagnosis not present

## 2015-06-20 DIAGNOSIS — I10 Essential (primary) hypertension: Secondary | ICD-10-CM | POA: Diagnosis not present

## 2015-06-20 DIAGNOSIS — I2510 Atherosclerotic heart disease of native coronary artery without angina pectoris: Secondary | ICD-10-CM | POA: Diagnosis not present

## 2015-06-20 DIAGNOSIS — M5126 Other intervertebral disc displacement, lumbar region: Secondary | ICD-10-CM | POA: Diagnosis not present

## 2015-07-03 ENCOUNTER — Other Ambulatory Visit: Payer: Self-pay | Admitting: Endocrinology

## 2015-07-24 DIAGNOSIS — H401131 Primary open-angle glaucoma, bilateral, mild stage: Secondary | ICD-10-CM | POA: Diagnosis not present

## 2015-07-24 DIAGNOSIS — E119 Type 2 diabetes mellitus without complications: Secondary | ICD-10-CM | POA: Diagnosis not present

## 2015-08-22 ENCOUNTER — Ambulatory Visit (INDEPENDENT_AMBULATORY_CARE_PROVIDER_SITE_OTHER): Payer: Medicare Other | Admitting: Endocrinology

## 2015-08-22 ENCOUNTER — Encounter: Payer: Self-pay | Admitting: Endocrinology

## 2015-08-22 VITALS — BP 120/42 | HR 96 | Wt 203.4 lb

## 2015-08-22 DIAGNOSIS — E1065 Type 1 diabetes mellitus with hyperglycemia: Secondary | ICD-10-CM | POA: Diagnosis not present

## 2015-08-22 DIAGNOSIS — IMO0002 Reserved for concepts with insufficient information to code with codable children: Secondary | ICD-10-CM

## 2015-08-22 DIAGNOSIS — E1040 Type 1 diabetes mellitus with diabetic neuropathy, unspecified: Secondary | ICD-10-CM | POA: Diagnosis not present

## 2015-08-22 LAB — POCT GLYCOSYLATED HEMOGLOBIN (HGB A1C): HEMOGLOBIN A1C: 8
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
## 2015-08-22 NOTE — Patient Instructions (Addendum)
Please change the insulins to the numbers listed below. .  Please make a follow-up appointment in 4 months.   check your blood sugar 2 times a day.  vary the time of day when you check, between before the 3 meals, and at bedtime.  also check if you have symptoms of your blood sugar being too high or too low.  please keep a record of the readings and bring it to your next appointment here.  please call us sooner if your blood sugar goes below 70, or if it stays over 200.

## 2015-08-25 DIAGNOSIS — B079 Viral wart, unspecified: Secondary | ICD-10-CM | POA: Diagnosis not present

## 2015-08-25 DIAGNOSIS — M79675 Pain in left toe(s): Secondary | ICD-10-CM | POA: Diagnosis not present

## 2015-09-18 NOTE — Progress Notes (Signed)
Cardiology Office Note    Date:  09/19/2015   ID:  Rojas, Brittany 18-Feb-1945, MRN [id number]  PCP:  Anthoney Harada, MD  Cardiologist: Sinclair Grooms, MD   Chief Complaint  Patient presents with  . Coronary Artery Disease    History of Present Illness:  Brittany Rojas is a 70 y.o. female  who presents for obtuse marginal #1 DES 2014, hypertension, obstructive sleep apnea, and hyperlipidemia.  She is doing well. She has not had chest discomfort. She is not had syncope or needed to use nitroglycerin. We did discuss aspirin needing to be only 81 mg per day. She prefers 325 because it helps with her trigger finger and arthritis in her left great toe. She is not exercising due to her foot problem.   Past Medical History:  Diagnosis Date  . Bronchitis    "touch q now and then" (02/17/2012)  . Depression   . Diverticulitis   . History of blood transfusion 1945   "when I was 55 days old" (02/17/2012)  . History of transient ischemic attack (TIA) 2001   "possibly; never really pinned down what it was" (02/17/2012)  . Hyperlipidemia   . Hypertension   . Migraines    "outgrew them I guess" (02/17/2012)  . OSA on CPAP   . Pneumonia 12/2010  . Shortness of breath    "occasionally; at any time" (02/17/2012)  . Type II diabetes mellitus ([place])     Past Surgical History:  Procedure Laterality Date  . ABDOMINAL HYSTERECTOMY  2000's  . APPENDECTOMY  04/1988  . CARDIAC CATHETERIZATION  02/13/2012  . COLECTOMY  01/18/1988   "took 3 feet out; result of lots of burst diverticula" (02/17/2012)  . COLECTOMY  04/1988   "9 more inches of colon" (02/17/2012)  . COLOSTOMY  01/18/1988  . COLOSTOMY REVERSAL  04/1988  . CORONARY ANGIOPLASTY WITH STENT PLACEMENT  02/17/2012   "1; first one ever" (02/17/2012)  . LEFT OOPHORECTOMY  1989  . PERCUTANEOUS CORONARY STENT INTERVENTION (PCI-S) N/A 02/17/2012   Procedure: PERCUTANEOUS CORONARY STENT INTERVENTION (PCI-S);  Surgeon: Sinclair Grooms, MD;  Location: [facility]  CATH LAB;  Service: Cardiovascular;  Laterality: N/A;    Current Medications: Outpatient Medications Prior to Visit  Medication Sig Dispense Refill  . albuterol (PROAIR HFA) 108 (90 BASE) MCG/ACT inhaler Inhale 2 puffs into the lungs every 4 (four) hours as needed. For shortness of breath    . aspirin 325 MG tablet Take 1 tablet (325 mg total) by mouth daily.    Marland Kitchen atorvastatin (LIPITOR) 10 MG tablet Take 10 mg by mouth daily.    Marland Kitchen gabapentin (NEURONTIN) 300 MG capsule TAKE ONE CAPSULE BY MOUTH TWICE DAILY 180 capsule 0  . Insulin Pen Needle (BD ULTRA-FINE PEN NEEDLES) 29G X 12.7MM MISC by Does not apply route 3 (three) times daily.      . insulin regular (NOVOLIN R,HUMULIN R) 100 units/mL injection 3 times a day (just before each meal), 15-20-20 units, and syringes 4/month.    . losartan-hydrochlorothiazide (HYZAAR) 50-12.5 MG per tablet Take 1 tablet by mouth daily.    . Multiple Vitamin (MULTIVITAMIN WITH MINERALS) TABS Take 1 tablet by mouth daily.    . nitroGLYCERIN (NITROSTAT) 0.4 MG SL tablet Place 1 tablet (0.4 mg total) under the tongue every 5 (five) minutes as needed for chest pain. 25 tablet 3  . traMADol (ULTRAM) 50 MG tablet Take by mouth every 6 (six) hours as needed for moderate  pain.     . insulin NPH (HUMULIN N,NOVOLIN N) 100 UNIT/ML injection Inject 55 Units into the skin at bedtime.     . meloxicam (MOBIC) 7.5 MG tablet Take 7.5 mg by mouth 2 (two) times daily. Reported on 04/18/2015     No facility-administered medications prior to visit.      Allergies:   Erythromycin; Penicillins; Tetracycline; and Sulfonamide derivatives   Social History   Social History  . Marital status: Married    Spouse name: N/A  . Number of children: N/A  . Years of education: N/A   Social History Main Topics  . Smoking status: Former Smoker    Years: 2.00    Types: Cigarettes    Quit date: 11/11/1969  . Smokeless tobacco: Never Used  . Alcohol use No  . Drug use: No  . Sexual  activity: Not Currently   Other Topics Concern  . None   Social History Narrative  . None     Family History:  The patient's family history includes Angina in her mother; Diabetes in her mother; Lung cancer in her father; Other in her sister.   ROS:   Please see the history of present illness.    None  All other systems reviewed and are negative.   PHYSICAL EXAM:   VS:  BP (!) 144/60   Pulse 88   Ht 5\' 3"  (1.6 m)   Wt 205 lb (93 kg)   BMI 36.31 kg/m    GEN: Well nourished, well developed, in no acute distress  HEENT: normal  Neck: no JVD, carotid bruits, or masses Cardiac: RRR; no murmurs, rubs, or gallops,no edema  Respiratory:  clear to auscultation bilaterally, normal work of breathing GI: soft, nontender, nondistended, + BS MS: no deformity or atrophy  Skin: warm and dry, no rash Neuro:  Alert and Oriented x 3, Strength and sensation are intact Psych: euthymic mood, full affect  Wt Readings from Last 3 Encounters:  09/19/15 205 lb (93 kg)  08/22/15 203 lb 6.4 oz (92.3 kg)  04/18/15 186 lb (84.4 kg)      Studies/Labs Reviewed:   EKG:  EKG Reveals sinus rhythm, and is normal  Recent Labs: No results found for requested labs within last 8760 hours.   Lipid Panel No results found for: CHOL, TRIG, HDL, CHOLHDL, VLDL, LDLCALC, LDLDIRECT  Additional studies/ records that were reviewed today include:  No new data    ASSESSMENT:    1. Atherosclerosis of native coronary artery of native heart without angina pectoris   2. Essential hypertension   3. Hyperlipidemia   4. Diabetes mellitus due to underlying condition with other kidney complication ([place])      PLAN:  In order of problems listed above:  1. Decrease aspirin to 81 mg daily for heart purposes. She will probably continue 325 mg daily because it helps with arthritis in her hand and foot. 2. 2 g sodium diet and weight loss. Aerobic exercise also stressed. 3. LDL target of 70 Dr. Jacelyn Grip. 4. The close  connection between diabetes and vascular disease was discussed in detail with the patient. Aerobic activity and hemoglobin A1c less than 7 were discussed.    Medication Adjustments/Labs and Tests Ordered: Current medicines are reviewed at length with the patient today.  Concerns regarding medicines are outlined above.  Medication changes, Labs and Tests ordered today are listed in the Patient Instructions below. Patient Instructions  Medication Instructions:  Your physician recommends that you continue on your current  medications as directed. Please refer to the Current Medication list given to you today.   Labwork: None ordered.  Testing/Procedures: None ordered  Follow-Up: Your physician wants you to follow-up in 1 year. You will receive a reminder letter in the mail two months in advance. If you don't receive a letter, please call our office to schedule the follow-up appointment.   Any Other Special Instructions Will Be Listed Below (If Applicable).  Stay Active!!   If you need a refill on your cardiac medications before your next appointment, please call your pharmacy.      Signed, Sinclair Grooms, MD  09/19/2015 2:01 PM    [facility] [address], [place], [place]  [address] Phone: [phone]; Fax: [phone]

## 2015-09-19 ENCOUNTER — Encounter: Payer: Self-pay | Admitting: Interventional Cardiology

## 2015-09-19 ENCOUNTER — Ambulatory Visit (INDEPENDENT_AMBULATORY_CARE_PROVIDER_SITE_OTHER): Payer: Medicare Other | Admitting: Interventional Cardiology

## 2015-09-19 VITALS — BP 144/60 | HR 88 | Ht 63.0 in | Wt 205.0 lb

## 2015-09-19 DIAGNOSIS — E0829 Diabetes mellitus due to underlying condition with other diabetic kidney complication: Secondary | ICD-10-CM

## 2015-09-19 DIAGNOSIS — I10 Essential (primary) hypertension: Secondary | ICD-10-CM

## 2015-09-19 DIAGNOSIS — E785 Hyperlipidemia, unspecified: Secondary | ICD-10-CM

## 2015-09-19 DIAGNOSIS — I2510 Atherosclerotic heart disease of native coronary artery without angina pectoris: Secondary | ICD-10-CM

## 2015-09-19 NOTE — Patient Instructions (Signed)
Medication Instructions:  Your physician recommends that you continue on your current medications as directed. Please refer to the Current Medication list given to you today.   Labwork: None ordered.  Testing/Procedures: None ordered  Follow-Up: Your physician wants you to follow-up in 1 year. You will receive a reminder letter in the mail two months in advance. If you don't receive a letter, please call our office to schedule the follow-up appointment.   Any Other Special Instructions Will Be Listed Below (If Applicable).  Stay Active!!   If you need a refill on your cardiac medications before your next appointment, please call your pharmacy.

## 2015-09-29 DIAGNOSIS — M25571 Pain in right ankle and joints of right foot: Secondary | ICD-10-CM | POA: Diagnosis not present

## 2015-09-29 DIAGNOSIS — E119 Type 2 diabetes mellitus without complications: Secondary | ICD-10-CM | POA: Diagnosis not present

## 2015-09-29 DIAGNOSIS — M2012 Hallux valgus (acquired), left foot: Secondary | ICD-10-CM | POA: Diagnosis not present

## 2015-09-29 DIAGNOSIS — Z79899 Other long term (current) drug therapy: Secondary | ICD-10-CM | POA: Diagnosis not present

## 2015-09-29 DIAGNOSIS — M2011 Hallux valgus (acquired), right foot: Secondary | ICD-10-CM | POA: Diagnosis not present

## 2015-10-02 ENCOUNTER — Other Ambulatory Visit: Payer: Self-pay | Admitting: Endocrinology

## 2015-10-03 DIAGNOSIS — I10 Essential (primary) hypertension: Secondary | ICD-10-CM | POA: Diagnosis not present

## 2015-10-03 DIAGNOSIS — E782 Mixed hyperlipidemia: Secondary | ICD-10-CM | POA: Diagnosis not present

## 2015-10-04 ENCOUNTER — Telehealth: Payer: Self-pay | Admitting: Endocrinology

## 2015-10-04 MED ORDER — GABAPENTIN 300 MG PO CAPS: 300.0000 mg | ORAL_CAPSULE | Freq: Two times a day (BID) | ORAL | 1 refills | Status: DC

## 2015-10-04 NOTE — Telephone Encounter (Signed)
Pt is requesting refill on gabapentin please

## 2015-10-04 NOTE — Telephone Encounter (Signed)
Rx submitted per pt's request.  

## 2015-10-18 DIAGNOSIS — Z794 Long term (current) use of insulin: Secondary | ICD-10-CM | POA: Diagnosis not present

## 2015-10-18 DIAGNOSIS — G4733 Obstructive sleep apnea (adult) (pediatric): Secondary | ICD-10-CM | POA: Diagnosis not present

## 2015-10-18 DIAGNOSIS — M21611 Bunion of right foot: Secondary | ICD-10-CM | POA: Diagnosis not present

## 2015-10-18 DIAGNOSIS — E119 Type 2 diabetes mellitus without complications: Secondary | ICD-10-CM | POA: Diagnosis not present

## 2015-10-18 DIAGNOSIS — I2510 Atherosclerotic heart disease of native coronary artery without angina pectoris: Secondary | ICD-10-CM | POA: Diagnosis not present

## 2015-10-18 DIAGNOSIS — E782 Mixed hyperlipidemia: Secondary | ICD-10-CM | POA: Diagnosis not present

## 2015-10-18 DIAGNOSIS — M6530 Trigger finger, unspecified finger: Secondary | ICD-10-CM | POA: Diagnosis not present

## 2015-10-18 DIAGNOSIS — I10 Essential (primary) hypertension: Secondary | ICD-10-CM | POA: Diagnosis not present

## 2015-10-18 DIAGNOSIS — M2042 Other hammer toe(s) (acquired), left foot: Secondary | ICD-10-CM | POA: Diagnosis not present

## 2015-10-18 DIAGNOSIS — M2041 Other hammer toe(s) (acquired), right foot: Secondary | ICD-10-CM | POA: Diagnosis not present

## 2015-10-31 DIAGNOSIS — M2020 Hallux rigidus, unspecified foot: Secondary | ICD-10-CM | POA: Diagnosis not present

## 2015-10-31 DIAGNOSIS — M2040 Other hammer toe(s) (acquired), unspecified foot: Secondary | ICD-10-CM | POA: Diagnosis not present

## 2015-10-31 DIAGNOSIS — M2011 Hallux valgus (acquired), right foot: Secondary | ICD-10-CM | POA: Diagnosis not present

## 2015-10-31 DIAGNOSIS — M25571 Pain in right ankle and joints of right foot: Secondary | ICD-10-CM | POA: Diagnosis not present

## 2015-10-31 DIAGNOSIS — M2041 Other hammer toe(s) (acquired), right foot: Secondary | ICD-10-CM | POA: Diagnosis not present

## 2015-10-31 DIAGNOSIS — M779 Enthesopathy, unspecified: Secondary | ICD-10-CM | POA: Diagnosis not present

## 2015-11-03 DIAGNOSIS — M25571 Pain in right ankle and joints of right foot: Secondary | ICD-10-CM | POA: Diagnosis not present

## 2015-11-21 ENCOUNTER — Ambulatory Visit: Payer: Medicare Other | Admitting: Endocrinology

## 2015-11-24 DIAGNOSIS — M25571 Pain in right ankle and joints of right foot: Secondary | ICD-10-CM | POA: Diagnosis not present

## 2015-11-24 DIAGNOSIS — Z23 Encounter for immunization: Secondary | ICD-10-CM | POA: Diagnosis not present

## 2015-12-07 DIAGNOSIS — M25571 Pain in right ankle and joints of right foot: Secondary | ICD-10-CM | POA: Diagnosis not present

## 2015-12-24 NOTE — Progress Notes (Deleted)
   Subjective:    Patient ID: Brittany Rojas, female    DOB: [birth year], 70 y.o.   MRN: [id number]  HPI Pt returns for f/u of diabetes mellitus: DM type: Insulin-requiring type 2. Dx'ed: 1996. Complications: polyneuropathy, TIA, and CAD.  herapy: insulin since 2005 GDM: never. DKA: never. Severe hypoglycemia: twice (2011 and 2016).  Pancreatitis: never.   Other:  she takes multiple daily injections; she declines pump rx; she takes human insulin, due to cost.  Interval history: pt states she feels well in general.  no cbg record, but states cbg's are lowest at hs (60's), and highest fasting (high-100's).  Last steroid injection was 2 mos ago (lower back).  She occasionally eats at hs.    Review of Systems     Objective:   Physical Exam VITAL SIGNS:  See vs page GENERAL: no distress.  Pulses: dorsalis pedis intact bilat.  MSK: no deformity of the feet, except for bilat hammer toes and several overlapping toes.  CV: no leg edema.  Skin: no ulcer on the feet, but there are several calluses. normal color and temp on the feet, but there is spotty hyperpigmentation of the legs.  Neuro: sensation is intact to touch on the feet, but decreased from normal.  Ext: There is bilateral onychomycosis of the toenails.         Assessment & Plan:

## 2015-12-26 ENCOUNTER — Ambulatory Visit: Payer: Medicare Other | Admitting: Endocrinology

## 2016-01-01 DIAGNOSIS — M25571 Pain in right ankle and joints of right foot: Secondary | ICD-10-CM | POA: Diagnosis not present

## 2016-01-07 NOTE — Progress Notes (Signed)
Subjective:    Patient ID: Brittany Rojas, female    DOB: [birth year], 70 y.o.   MRN: [id number]  HPI Pt returns for f/u of diabetes mellitus: DM type: Insulin-requiring type 2. Dx'ed: 1996. Complications: polyneuropathy, TIA, and CAD.  herapy: insulin since 2005 GDM: never. DKA: never. Severe hypoglycemia: twice (2011 and 2016).   Pancreatitis: never.   Other:  she takes multiple daily injections; she declines pump rx; she takes human insulin, due to cost; she is retired.   Interval history: pt states she feels well in general.  no cbg record, but states cbg's are highest fasting (mid-100's), but cbg's are variable.  She seldom has hypoglycemia, and these episodes are mild.  Past Medical History:  Diagnosis Date  . Bronchitis    "touch q now and then" (02/17/2012)  . Depression   . Diverticulitis   . History of blood transfusion 1945   "when I was 38 days old" (02/17/2012)  . History of transient ischemic attack (TIA) 2001   "possibly; never really pinned down what it was" (02/17/2012)  . Hyperlipidemia   . Hypertension   . Migraines    "outgrew them I guess" (02/17/2012)  . OSA on CPAP   . Pneumonia 12/2010  . Shortness of breath    "occasionally; at any time" (02/17/2012)  . Type II diabetes mellitus ([place])     Past Surgical History:  Procedure Laterality Date  . ABDOMINAL HYSTERECTOMY  2000's  . APPENDECTOMY  04/1988  . CARDIAC CATHETERIZATION  02/13/2012  . COLECTOMY  01/18/1988   "took 3 feet out; result of lots of burst diverticula" (02/17/2012)  . COLECTOMY  04/1988   "9 more inches of colon" (02/17/2012)  . COLOSTOMY  01/18/1988  . COLOSTOMY REVERSAL  04/1988  . CORONARY ANGIOPLASTY WITH STENT PLACEMENT  02/17/2012   "1; first one ever" (02/17/2012)  . LEFT OOPHORECTOMY  1989  . PERCUTANEOUS CORONARY STENT INTERVENTION (PCI-S) N/A 02/17/2012   Procedure: PERCUTANEOUS CORONARY STENT INTERVENTION (PCI-S);  Surgeon: Sinclair Grooms, MD;  Location[facility] CATH LAB;  Service: Cardiovascular;   Laterality: N/A;    Social History   Social History  . Marital status: Married    Spouse name: N/A  . Number of children: N/A  . Years of education: N/A   Occupational History  . Not on file.   Social History Main Topics  . Smoking status: Former Smoker    Years: 2.00    Types: Cigarettes    Quit date: 11/11/1969  . Smokeless tobacco: Never Used  . Alcohol use No  . Drug use: No  . Sexual activity: Not Currently   Other Topics Concern  . Not on file   Social History Narrative  . No narrative on file    Current Outpatient Prescriptions on File Prior to Visit  Medication Sig Dispense Refill  . albuterol (PROAIR HFA) 108 (90 BASE) MCG/ACT inhaler Inhale 2 puffs into the lungs every 4 (four) hours as needed. For shortness of breath    . aspirin 325 MG tablet Take 1 tablet (325 mg total) by mouth daily.    Marland Kitchen atorvastatin (LIPITOR) 10 MG tablet Take 10 mg by mouth daily.    Marland Kitchen gabapentin (NEURONTIN) 300 MG capsule Take 1 capsule (300 mg total) by mouth 2 (two) times daily. 180 capsule 1  . Insulin Pen Needle (BD ULTRA-FINE PEN NEEDLES) 29G X 12.7MM MISC by Does not apply route 3 (three) times daily.      . insulin  regular (NOVOLIN R,HUMULIN R) 100 units/mL injection 3 times a day (just before each meal), 15-20-20 units, and syringes 4/month.    . losartan-hydrochlorothiazide (HYZAAR) 50-12.5 MG per tablet Take 1 tablet by mouth daily.    . Multiple Vitamin (MULTIVITAMIN WITH MINERALS) TABS Take 1 tablet by mouth daily.    . nitroGLYCERIN (NITROSTAT) 0.4 MG SL tablet Place 1 tablet (0.4 mg total) under the tongue every 5 (five) minutes as needed for chest pain. 25 tablet 3  . traMADol (ULTRAM) 50 MG tablet Take by mouth every 6 (six) hours as needed for moderate pain.     Marland Kitchen insulin NPH (HUMULIN N,NOVOLIN N) 100 UNIT/ML injection Inject 55 Units into the skin at bedtime.     . [DISCONTINUED] telmisartan-hydrochlorothiazide (MICARDIS HCT) 80-12.5 MG per tablet Take 1 tablet by mouth  daily.       No current facility-administered medications on file prior to visit.     Allergies  Allergen Reactions  . Erythromycin Other (See Comments)    "upset stomach" (02/17/2012)  . Penicillins Other (See Comments)    ">50 yr ago, I had strept throat; had gotten lots and lots of PCN; last dose caused me to stop breathing" (02/17/2012)  . Tetracycline Other (See Comments)    "upset stomach" (02/17/2012)  . Sulfonamide Derivatives Rash and Other (See Comments)    "upset stomach" (02/17/2012)    Family History  Problem Relation Age of Onset  . Diabetes Mother   . Angina Mother   . Lung cancer Father   . Other Sister     BRAIN TUMOR    BP 132/70   Pulse 93   Ht 5\' 3"  (1.6 m)   Wt 204 lb (92.5 kg)   SpO2 97%   BMI 36.14 kg/m    Review of Systems No weight change.      Objective:   Physical Exam VITAL SIGNS:  See vs page GENERAL: no distress Pulses: dorsalis pedis intact bilat.   MSK: no deformity of the feet. Old healed surgical scar on the right foot (hammer toe surgery). CV: no leg edema.  Skin: no ulcer on the feet, but there are several calluses. normal color and temp on the feet.   Neuro: sensation is intact to touch on the feet, but decreased from normal.   Ext: There is bilateral onychomycosis of the toenails.    A1c=7.1%     Assessment & Plan:  Insulin-requiring type 2 DM, with CAD: this is the best control this pt should aim for, given variable cbg's. Patient is advised the following: Patient Instructions  Please continue the same insulins.  Please make a follow-up appointment in 4 months.   check your blood sugar 2 times a day.  vary the time of day when you check, between before the 3 meals, and at bedtime.  also check if you have symptoms of your blood sugar being too high or too low.  please keep a record of the readings and bring it to your next appointment here.  please call us sooner if your blood sugar goes below 70, or if it stays over 200.

## 2016-01-09 ENCOUNTER — Ambulatory Visit (INDEPENDENT_AMBULATORY_CARE_PROVIDER_SITE_OTHER): Payer: Medicare Other | Admitting: Endocrinology

## 2016-01-09 VITALS — BP 132/70 | HR 93 | Ht 63.0 in | Wt 204.0 lb

## 2016-01-09 DIAGNOSIS — I2510 Atherosclerotic heart disease of native coronary artery without angina pectoris: Secondary | ICD-10-CM

## 2016-01-09 DIAGNOSIS — E0829 Diabetes mellitus due to underlying condition with other diabetic kidney complication: Secondary | ICD-10-CM | POA: Diagnosis not present

## 2016-01-09 LAB — POCT GLYCOSYLATED HEMOGLOBIN (HGB A1C): Hemoglobin A1C: 7.1

## 2016-01-09 NOTE — Patient Instructions (Addendum)
Please continue the same insulins.  Please make a follow-up appointment in 4 months.   check your blood sugar 2 times a day.  vary the time of day when you check, between before the 3 meals, and at bedtime.  also check if you have symptoms of your blood sugar being too high or too low.  please keep a record of the readings and bring it to your next appointment here.  please call us sooner if your blood sugar goes below 70, or if it stays over 200.

## 2016-01-16 DIAGNOSIS — M2041 Other hammer toe(s) (acquired), right foot: Secondary | ICD-10-CM | POA: Diagnosis not present

## 2016-01-16 DIAGNOSIS — M6530 Trigger finger, unspecified finger: Secondary | ICD-10-CM | POA: Diagnosis not present

## 2016-01-16 DIAGNOSIS — M21611 Bunion of right foot: Secondary | ICD-10-CM | POA: Diagnosis not present

## 2016-01-16 DIAGNOSIS — E782 Mixed hyperlipidemia: Secondary | ICD-10-CM | POA: Diagnosis not present

## 2016-01-16 DIAGNOSIS — E119 Type 2 diabetes mellitus without complications: Secondary | ICD-10-CM | POA: Diagnosis not present

## 2016-01-16 DIAGNOSIS — I2510 Atherosclerotic heart disease of native coronary artery without angina pectoris: Secondary | ICD-10-CM | POA: Diagnosis not present

## 2016-01-16 DIAGNOSIS — Z794 Long term (current) use of insulin: Secondary | ICD-10-CM | POA: Diagnosis not present

## 2016-01-16 DIAGNOSIS — I10 Essential (primary) hypertension: Secondary | ICD-10-CM | POA: Diagnosis not present

## 2016-01-16 DIAGNOSIS — M2042 Other hammer toe(s) (acquired), left foot: Secondary | ICD-10-CM | POA: Diagnosis not present

## 2016-01-16 DIAGNOSIS — G4733 Obstructive sleep apnea (adult) (pediatric): Secondary | ICD-10-CM | POA: Diagnosis not present

## 2016-01-23 DIAGNOSIS — H401131 Primary open-angle glaucoma, bilateral, mild stage: Secondary | ICD-10-CM | POA: Diagnosis not present

## 2016-01-23 DIAGNOSIS — H2513 Age-related nuclear cataract, bilateral: Secondary | ICD-10-CM | POA: Diagnosis not present

## 2016-01-23 DIAGNOSIS — H25013 Cortical age-related cataract, bilateral: Secondary | ICD-10-CM | POA: Diagnosis not present

## 2016-02-15 DIAGNOSIS — M2041 Other hammer toe(s) (acquired), right foot: Secondary | ICD-10-CM | POA: Diagnosis not present

## 2016-02-15 DIAGNOSIS — M71571 Other bursitis, not elsewhere classified, right ankle and foot: Secondary | ICD-10-CM | POA: Diagnosis not present

## 2016-03-09 DIAGNOSIS — R319 Hematuria, unspecified: Secondary | ICD-10-CM | POA: Diagnosis not present

## 2016-03-09 DIAGNOSIS — R112 Nausea with vomiting, unspecified: Secondary | ICD-10-CM | POA: Diagnosis not present

## 2016-03-09 DIAGNOSIS — R109 Unspecified abdominal pain: Secondary | ICD-10-CM | POA: Diagnosis not present

## 2016-03-09 DIAGNOSIS — R1084 Generalized abdominal pain: Secondary | ICD-10-CM | POA: Diagnosis not present

## 2016-03-09 DIAGNOSIS — R197 Diarrhea, unspecified: Secondary | ICD-10-CM | POA: Diagnosis not present

## 2016-03-13 DIAGNOSIS — M71571 Other bursitis, not elsewhere classified, right ankle and foot: Secondary | ICD-10-CM | POA: Diagnosis not present

## 2016-03-13 DIAGNOSIS — M2041 Other hammer toe(s) (acquired), right foot: Secondary | ICD-10-CM | POA: Diagnosis not present

## 2016-03-26 DIAGNOSIS — G4733 Obstructive sleep apnea (adult) (pediatric): Secondary | ICD-10-CM | POA: Diagnosis not present

## 2016-04-09 DIAGNOSIS — M21611 Bunion of right foot: Secondary | ICD-10-CM | POA: Diagnosis not present

## 2016-04-09 DIAGNOSIS — Z794 Long term (current) use of insulin: Secondary | ICD-10-CM | POA: Diagnosis not present

## 2016-04-09 DIAGNOSIS — M2041 Other hammer toe(s) (acquired), right foot: Secondary | ICD-10-CM | POA: Diagnosis not present

## 2016-04-09 DIAGNOSIS — I10 Essential (primary) hypertension: Secondary | ICD-10-CM | POA: Diagnosis not present

## 2016-04-09 DIAGNOSIS — I2510 Atherosclerotic heart disease of native coronary artery without angina pectoris: Secondary | ICD-10-CM | POA: Diagnosis not present

## 2016-04-09 DIAGNOSIS — G4733 Obstructive sleep apnea (adult) (pediatric): Secondary | ICD-10-CM | POA: Diagnosis not present

## 2016-04-09 DIAGNOSIS — M6530 Trigger finger, unspecified finger: Secondary | ICD-10-CM | POA: Diagnosis not present

## 2016-04-09 DIAGNOSIS — E119 Type 2 diabetes mellitus without complications: Secondary | ICD-10-CM | POA: Diagnosis not present

## 2016-04-09 DIAGNOSIS — M2042 Other hammer toe(s) (acquired), left foot: Secondary | ICD-10-CM | POA: Diagnosis not present

## 2016-04-09 DIAGNOSIS — H6122 Impacted cerumen, left ear: Secondary | ICD-10-CM | POA: Diagnosis not present

## 2016-04-09 DIAGNOSIS — E782 Mixed hyperlipidemia: Secondary | ICD-10-CM | POA: Diagnosis not present

## 2016-04-09 DIAGNOSIS — H9202 Otalgia, left ear: Secondary | ICD-10-CM | POA: Diagnosis not present

## 2016-04-10 ENCOUNTER — Other Ambulatory Visit: Payer: Self-pay

## 2016-04-10 DIAGNOSIS — M792 Neuralgia and neuritis, unspecified: Secondary | ICD-10-CM | POA: Diagnosis not present

## 2016-04-10 DIAGNOSIS — G609 Hereditary and idiopathic neuropathy, unspecified: Secondary | ICD-10-CM | POA: Diagnosis not present

## 2016-04-10 MED ORDER — GABAPENTIN 300 MG PO CAPS
300.0000 mg | ORAL_CAPSULE | Freq: Two times a day (BID) | ORAL | 1 refills | Status: DC
Start: 2016-04-10 — End: 2016-05-07

## 2016-04-12 DIAGNOSIS — L820 Inflamed seborrheic keratosis: Secondary | ICD-10-CM | POA: Diagnosis not present

## 2016-04-12 DIAGNOSIS — L918 Other hypertrophic disorders of the skin: Secondary | ICD-10-CM | POA: Diagnosis not present

## 2016-04-26 DIAGNOSIS — M792 Neuralgia and neuritis, unspecified: Secondary | ICD-10-CM | POA: Diagnosis not present

## 2016-04-28 IMAGING — MR MR LUMBAR SPINE WO/W CM
7 series · 42 of 48 positions shown · IV contrast (multihance)
Comparison: MRI lumbar spine 03/10/2008.

CLINICAL DATA: Right side low back pain radiating to the right leg
4-5 weeks. History of lumbar surgery 03/30/2008.

BUN and creatinine were obtained on site at [HOSPITAL] at
[HOSPITAL].
Results:  BUN 19 mg/dL,  Creatinine 0.9 mg/dL.
EXAM:
MRI LUMBAR SPINE WITHOUT AND WITH CONTRAST
TECHNIQUE: Multiplanar and multiecho pulse sequences of the lumbar spine were
obtained without and with intravenous contrast.
CONTRAST:  20 mL MULTIHANCE GADOBENATE DIMEGLUMINE 529 MG/ML IV SOLN

[Series 3: tirm sag · sagittal · 4.0mm · 0.55mm/px · 5 of 13 slices shown]
[im 1/13]
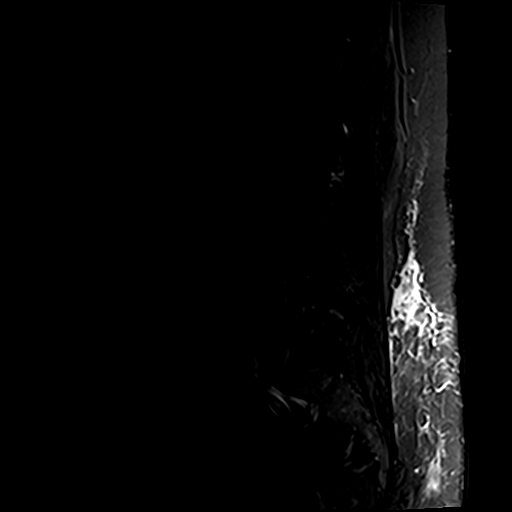
[im 4/13]
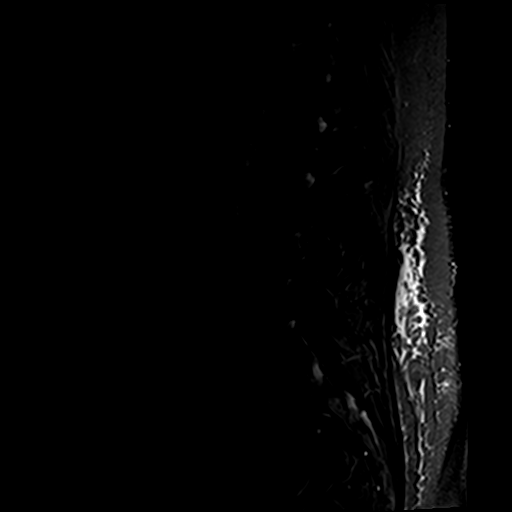
[im 7/13]
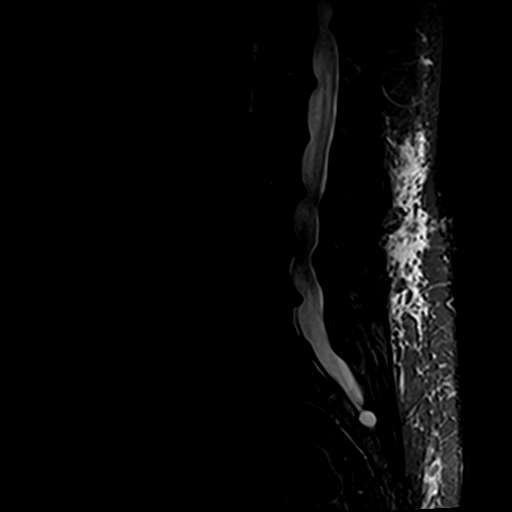
[im 10/13]
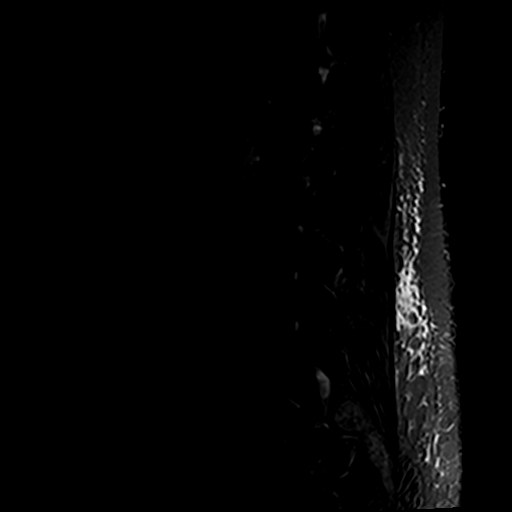
[im 13/13]
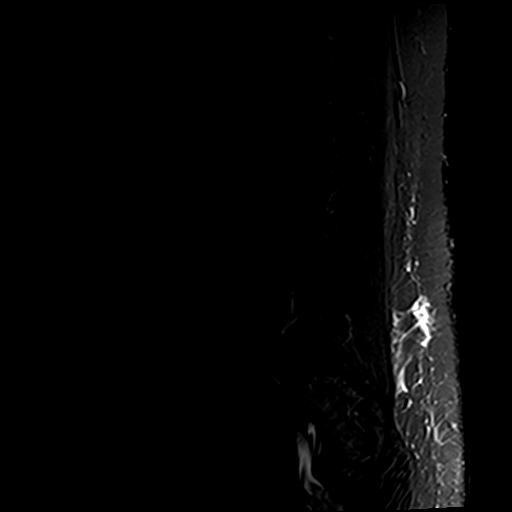

[Series 4: T1 · sagittal · 4.0mm · 0.88mm/px · 4 of 13 slices shown (1 of 2)]
[im 1/13]
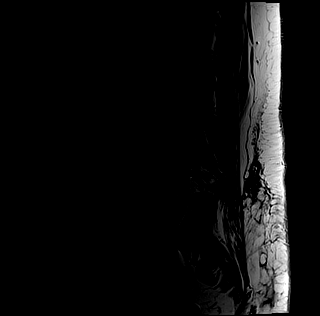
[im 5/13]
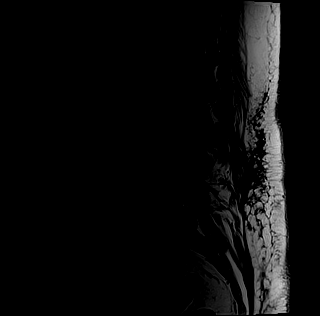
[im 9/13]
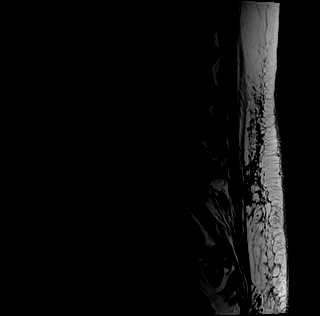
[im 13/13]
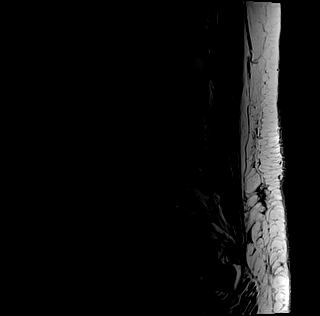

[Series 5: T1 · axial · 4.0mm · 0.70mm/px · z∈[-9,+147]mm · 8 of 28 slices shown (2 of 2)]
[im 1/28]
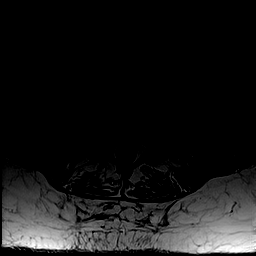
[im 4/28]
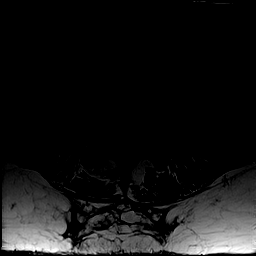
[im 10/28]
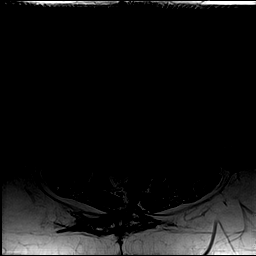
[im 13/28]
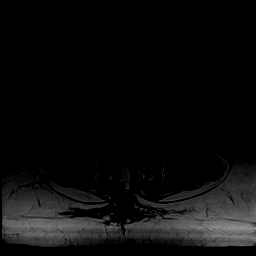
[im 16/28]
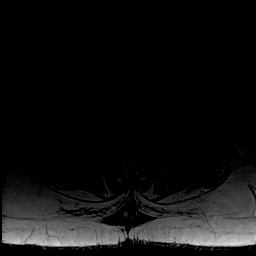
[im 19/28]
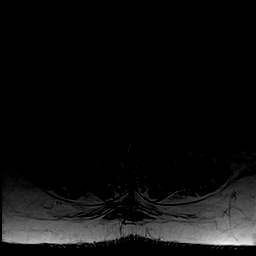
[im 25/28]
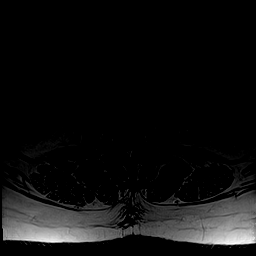
[im 28/28]
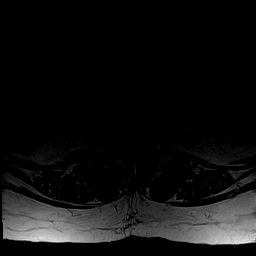

[Series 6: T2 · axial · 4.0mm · 0.70mm/px · z∈[-9,+147]mm · 9 of 28 slices shown (1 of 2)]
[im 1/28]
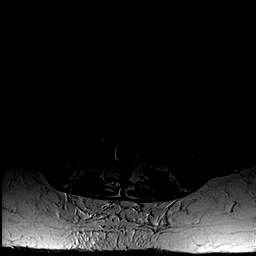
[im 4/28]
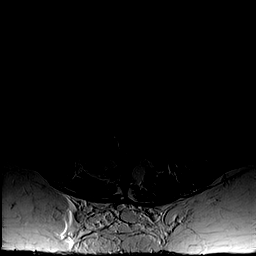
[im 7/28]
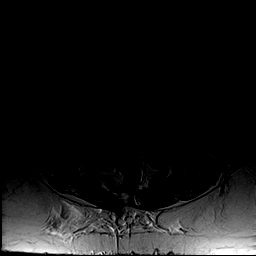
[im 10/28]
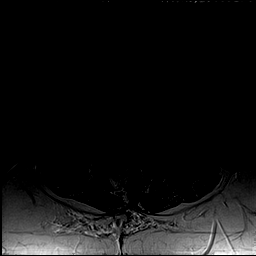
[im 13/28]
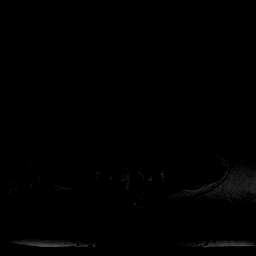
[im 16/28]
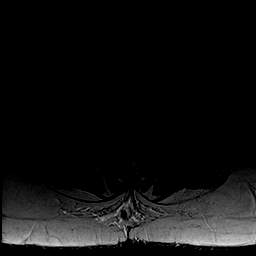
[im 19/28]
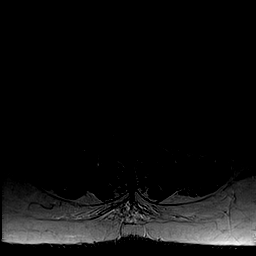
[im 25/28]
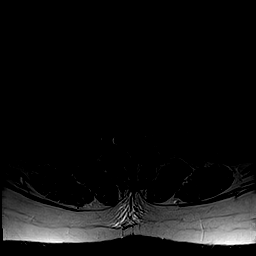
[im 28/28]
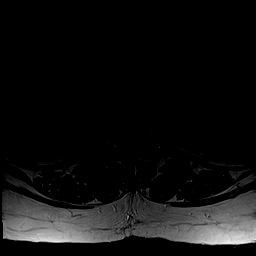

[Series 7: T2 · sagittal · 4.0mm · 0.88mm/px · 4 of 13 slices shown (2 of 2)]
[im 1/13]
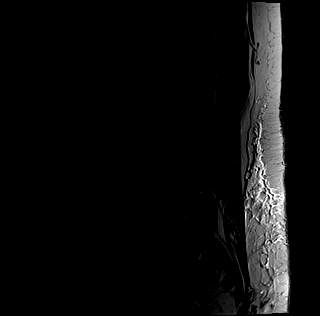
[im 5/13]
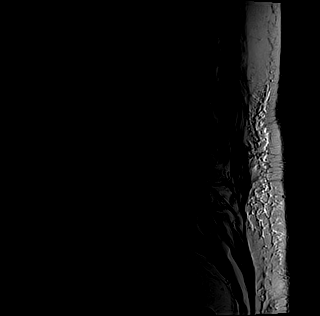
[im 9/13]
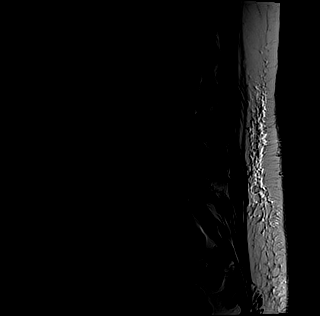
[im 13/13]
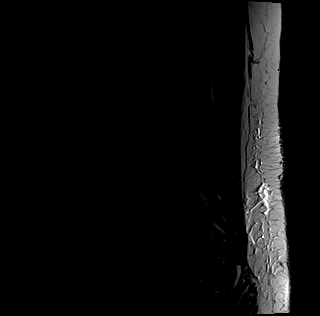

[Series 8: T1 fat-sat post-contrast · sagittal · 4.0mm · 0.88mm/px · 4 of 13 slices shown]
[im 1/13]
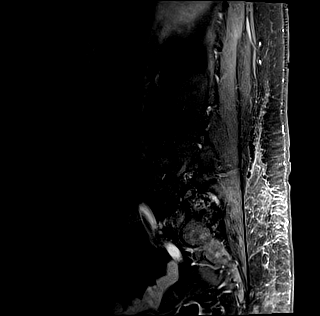
[im 5/13]
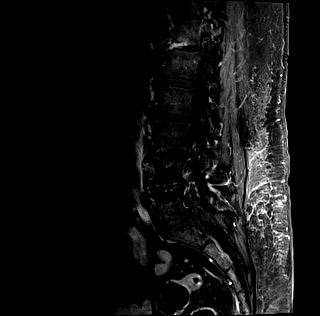
[im 9/13]
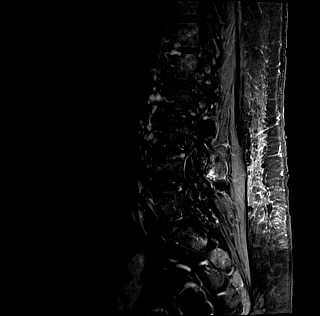
[im 13/13]
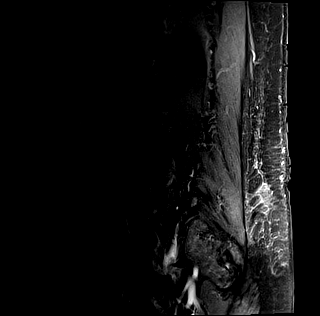

[Series 9: T1 post-contrast · axial · 4.0mm · 0.74mm/px · z∈[-10,+175]mm · 8 of 31 slices shown]
[im 1/31]
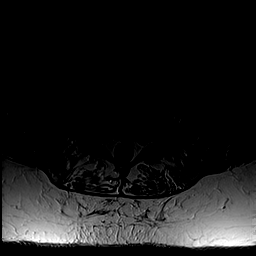
[im 7/31]
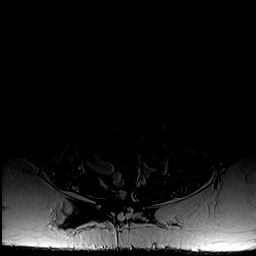
[im 10/31]
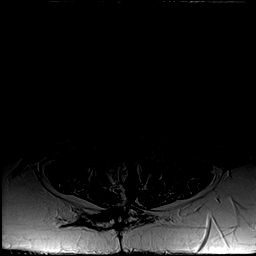
[im 13/31]
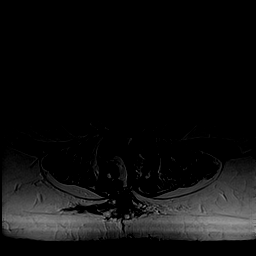
[im 19/31]
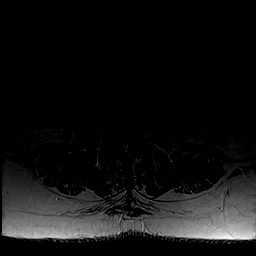
[im 22/31]
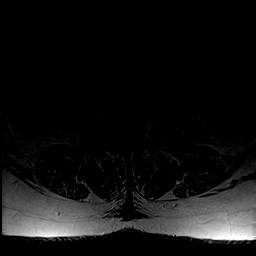
[im 25/31]
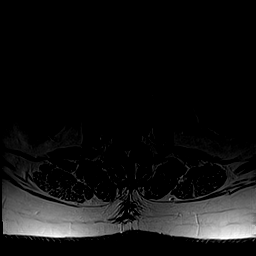
[im 31/31]
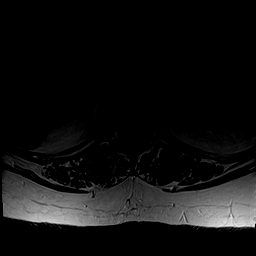

[42 of 48 positions shown; findings below may reference images not displayed]

FINDINGS: Vertebral body height is maintained. Hemangioma in L2 is noted.
Scattered degenerative endplate signal change is most notable at
L4-5. The conus medullaris is normal in signal and position. Imaged
intra-abdominal contents are unremarkable.

The T11-12 and T12-L1 levels are imaged in the sagittal plane only.
There has been some progression of degenerative disc disease at both
these levels, more notable at T12-L1 where there is a central and
right paracentral protrusion. The central canal and foramina appear
open at each level.

L1-2: Minimal disc bulge without central canal or foraminal
narrowing.

L2-3: There has been some progression of degenerative change. The
patient has a disc bulge eccentrically prominent to the left where a
more focal paracentral and lateral protrusion is seen. There is
facet degenerative disease. Narrowing in the left lateral recess
with some encroach on the descending left L2 root is identified.
Mild to moderate central canal narrowing is present overall. Neural
foramina are open.

L3-4: There has been progression of disease at this level where a
broad-based disc bulge is identified and there is ligamentum flavum
thickening and facet arthropathy. Moderate to moderately severe
central canal stenosis is present. There is also moderate bilateral
foraminal narrowing.

L4-5: Right laminotomy defect is identified. Large right lateral
recess and foraminal protrusion seen on the comparison study has
been resected. Disc and endplate spur centrally and to the left are
identified causing narrowing in the left lateral recess. The thecal
sac is open. There is moderate to moderately severe right foraminal
narrowing. The left foramen is open.

L5-S1: Advanced bilateral facet degenerative disease is worse on the
right. There is a shallow broad-based disc bulge without central
canal or foraminal stenosis.
IMPRESSION: Progressive degenerative change at L3-4 where there is moderate to
moderately severe central canal stenosis and moderate bilateral
foraminal narrowing.

Progressive degenerative disease at L2-3 where a left paracentral
and lateral recess protrusion results narrowing in the left lateral
recess which could impact the descending left L3 root.

Interval right laminotomy for discectomy at L4-5 for resection of a
very large right lateral recess and foraminal disc protrusion. A
central and eccentric to the left protrusion causes narrowing left
lateral recess encroachment on the descending left L5 root. There is
also right worse than left foraminal narrowing at this level.

## 2016-05-03 DIAGNOSIS — I249 Acute ischemic heart disease, unspecified: Secondary | ICD-10-CM | POA: Diagnosis not present

## 2016-05-03 DIAGNOSIS — Z1371 Encounter for nonprocreative screening for genetic disease carrier status: Secondary | ICD-10-CM | POA: Diagnosis not present

## 2016-05-07 ENCOUNTER — Ambulatory Visit (INDEPENDENT_AMBULATORY_CARE_PROVIDER_SITE_OTHER): Payer: Medicare Other | Admitting: Endocrinology

## 2016-05-07 ENCOUNTER — Encounter: Payer: Self-pay | Admitting: Endocrinology

## 2016-05-07 VITALS — BP 138/88 | HR 78 | Ht 63.0 in | Wt 205.0 lb

## 2016-05-07 DIAGNOSIS — E0829 Diabetes mellitus due to underlying condition with other diabetic kidney complication: Secondary | ICD-10-CM

## 2016-05-07 LAB — POCT GLYCOSYLATED HEMOGLOBIN (HGB A1C): Hemoglobin A1C: 7.5

## 2016-05-07 MED ORDER — GABAPENTIN 300 MG PO CAPS: 900.0000 mg | ORAL_CAPSULE | Freq: Every day | ORAL | 3 refills | Status: DC

## 2016-05-07 NOTE — Progress Notes (Signed)
Subjective:    Patient ID: Brittany Rojas, female    DOB: 13-Aug-1945, 71 y.o.   MRN: [id number]  HPI Pt returns for f/u of diabetes mellitus: DM type: Insulin-requiring type 2. Dx'ed: 1996. Complications: polyneuropathy, TIA, and CAD.  herapy: insulin since 2005 GDM: never. DKA: never. Severe hypoglycemia: twice (2011 and 2016).   Pancreatitis: never.   Other:  she takes multiple daily injections; she declines pump rx; she takes human insulin, due to cost; she is retired.   Interval history: She has hypoglycemia approx twice a week, and these episodes are mild.  It is in general higher as the day goes on.  She says cbg's vary widely according of the size of the meal.    Past Medical History:  Diagnosis Date  . Bronchitis    "touch q now and then" (02/17/2012)  . Depression   . Diverticulitis   . History of blood transfusion 1945   "when I was 42 days old" (02/17/2012)  . History of transient ischemic attack (TIA) 2001   "possibly; never really pinned down what it was" (02/17/2012)  . Hyperlipidemia   . Hypertension   . Migraines    "outgrew them I guess" (02/17/2012)  . OSA on CPAP   . Pneumonia 12/2010  . Shortness of breath    "occasionally; at any time" (02/17/2012)  . Type II diabetes mellitus ([place])     Past Surgical History:  Procedure Laterality Date  . ABDOMINAL HYSTERECTOMY  2000's  . APPENDECTOMY  04/1988  . CARDIAC CATHETERIZATION  02/13/2012  . COLECTOMY  01/18/1988   "took 3 feet out; result of lots of burst diverticula" (02/17/2012)  . COLECTOMY  04/1988   "9 more inches of colon" (02/17/2012)  . COLOSTOMY  01/18/1988  . COLOSTOMY REVERSAL  04/1988  . CORONARY ANGIOPLASTY WITH STENT PLACEMENT  02/17/2012   "1; first one ever" (02/17/2012)  . LEFT OOPHORECTOMY  1989  . PERCUTANEOUS CORONARY STENT INTERVENTION (PCI-S) N/A 02/17/2012   Procedure: PERCUTANEOUS CORONARY STENT INTERVENTION (PCI-S);  Surgeon: Sinclair Grooms, MD;  Location: [facility] CATH LAB;  Service: Cardiovascular;   Laterality: N/A;    Social History   Social History  . Marital status: Married    Spouse name: N/A  . Number of children: N/A  . Years of education: N/A   Occupational History  . Not on file.   Social History Main Topics  . Smoking status: Former Smoker    Years: 2.00    Types: Cigarettes    Quit date: 11/11/1969  . Smokeless tobacco: Never Used  . Alcohol use No  . Drug use: No  . Sexual activity: Not Currently   Other Topics Concern  . Not on file   Social History Narrative  . No narrative on file    Current Outpatient Prescriptions on File Prior to Visit  Medication Sig Dispense Refill  . albuterol (PROAIR HFA) 108 (90 BASE) MCG/ACT inhaler Inhale 2 puffs into the lungs every 4 (four) hours as needed. For shortness of breath    . aspirin 325 MG tablet Take 1 tablet (325 mg total) by mouth daily.    . Insulin Pen Needle (BD ULTRA-FINE PEN NEEDLES) 29G X 12.7MM MISC by Does not apply route 3 (three) times daily.      . insulin regular (NOVOLIN R,HUMULIN R) 100 units/mL injection 3 times a day (just before each meal), 15-20-20 units, and syringes 4/month.    . Multiple Vitamin (MULTIVITAMIN WITH MINERALS) TABS Take  1 tablet by mouth daily.    . nitroGLYCERIN (NITROSTAT) 0.4 MG SL tablet Place 1 tablet (0.4 mg total) under the tongue every 5 (five) minutes as needed for chest pain. 25 tablet 3  . traMADol (ULTRAM) 50 MG tablet Take by mouth every 6 (six) hours as needed for moderate pain.     Marland Kitchen insulin NPH (HUMULIN N,NOVOLIN N) 100 UNIT/ML injection Inject 55 Units into the skin at bedtime.     . [DISCONTINUED] telmisartan-hydrochlorothiazide (MICARDIS HCT) 80-12.5 MG per tablet Take 1 tablet by mouth daily.       No current facility-administered medications on file prior to visit.     Allergies  Allergen Reactions  . Erythromycin Other (See Comments)    "upset stomach" (02/17/2012)  . Penicillins Other (See Comments)    ">50 yr ago, I had strept throat; had gotten lots  and lots of PCN; last dose caused me to stop breathing" (02/17/2012)  . Tetracycline Other (See Comments)    "upset stomach" (02/17/2012)  . Sulfonamide Derivatives Rash and Other (See Comments)    "upset stomach" (02/17/2012)    Family History  Problem Relation Age of Onset  . Diabetes Mother   . Angina Mother   . Lung cancer Father   . Other Sister     BRAIN TUMOR    BP 138/88   Pulse 78   Ht 5\' 3"  (1.6 m)   Wt 205 lb (93 kg)   SpO2 97%   BMI 36.31 kg/m    Review of Systems Denies LOC.      Objective:   Physical Exam VITAL SIGNS:  See vs page.  GENERAL: no distress.  Pulses: dorsalis pedis intact bilat.  MSK: no deformity of the feet. Old healed surgical scar on the right foot (hammer toe surgery).   CV: no leg edema.  Skin: no ulcer on the feet. normal color and temp on the feet.   Neuro: sensation is intact to touch on the feet, but decreased from normal.  Ext: There is bilateral onychomycosis of the toenails.       Assessment & Plan:  Insulin-requiring type 2 DM, with TIA: slightly worse. rx is limited by variable meal sizes.   Painful neuropathy: she needs increased rx.   Patient is advised the following: Patient Instructions  Please continue the same insulins.  However, vary the novolog by 2 units higher or lower, according to the size of the meal.  Please make a follow-up appointment in 4 months.   Please increase the gabapentin to 3 pills at bedtime.  check your blood sugar 2 times a day.  vary the time of day when you check, between before the 3 meals, and at bedtime.  also check if you have symptoms of your blood sugar being too high or too low.  please keep a record of the readings and bring it to your next appointment here.  please call us sooner if your blood sugar goes below 70, or if it stays over 200.

## 2016-05-07 NOTE — Patient Instructions (Addendum)
Please continue the same insulins.  However, vary the novolog by 2 units higher or lower, according to the size of the meal.  Please make a follow-up appointment in 4 months.   Please increase the gabapentin to 3 pills at bedtime.  check your blood sugar 2 times a day.  vary the time of day when you check, between before the 3 meals, and at bedtime.  also check if you have symptoms of your blood sugar being too high or too low.  please keep a record of the readings and bring it to your next appointment here.  please call us sooner if your blood sugar goes below 70, or if it stays over 200.

## 2016-05-28 DIAGNOSIS — Z1231 Encounter for screening mammogram for malignant neoplasm of breast: Secondary | ICD-10-CM | POA: Diagnosis not present

## 2016-06-04 DIAGNOSIS — Z794 Long term (current) use of insulin: Secondary | ICD-10-CM | POA: Diagnosis not present

## 2016-06-04 DIAGNOSIS — E782 Mixed hyperlipidemia: Secondary | ICD-10-CM | POA: Diagnosis not present

## 2016-06-04 DIAGNOSIS — I2510 Atherosclerotic heart disease of native coronary artery without angina pectoris: Secondary | ICD-10-CM | POA: Diagnosis not present

## 2016-06-04 DIAGNOSIS — I10 Essential (primary) hypertension: Secondary | ICD-10-CM | POA: Diagnosis not present

## 2016-06-04 DIAGNOSIS — E119 Type 2 diabetes mellitus without complications: Secondary | ICD-10-CM | POA: Diagnosis not present

## 2016-06-04 DIAGNOSIS — G4733 Obstructive sleep apnea (adult) (pediatric): Secondary | ICD-10-CM | POA: Diagnosis not present

## 2016-06-18 DIAGNOSIS — E876 Hypokalemia: Secondary | ICD-10-CM | POA: Diagnosis not present

## 2016-07-23 DIAGNOSIS — I10 Essential (primary) hypertension: Secondary | ICD-10-CM | POA: Diagnosis not present

## 2016-07-23 DIAGNOSIS — E119 Type 2 diabetes mellitus without complications: Secondary | ICD-10-CM | POA: Diagnosis not present

## 2016-07-23 DIAGNOSIS — I2510 Atherosclerotic heart disease of native coronary artery without angina pectoris: Secondary | ICD-10-CM | POA: Diagnosis not present

## 2016-07-31 DIAGNOSIS — M71571 Other bursitis, not elsewhere classified, right ankle and foot: Secondary | ICD-10-CM | POA: Diagnosis not present

## 2016-07-31 DIAGNOSIS — M792 Neuralgia and neuritis, unspecified: Secondary | ICD-10-CM | POA: Diagnosis not present

## 2016-08-06 DIAGNOSIS — E119 Type 2 diabetes mellitus without complications: Secondary | ICD-10-CM | POA: Diagnosis not present

## 2016-08-06 DIAGNOSIS — H401131 Primary open-angle glaucoma, bilateral, mild stage: Secondary | ICD-10-CM | POA: Diagnosis not present

## 2016-08-06 DIAGNOSIS — H25013 Cortical age-related cataract, bilateral: Secondary | ICD-10-CM | POA: Diagnosis not present

## 2016-08-06 DIAGNOSIS — H2513 Age-related nuclear cataract, bilateral: Secondary | ICD-10-CM | POA: Diagnosis not present

## 2016-08-28 DIAGNOSIS — E669 Obesity, unspecified: Secondary | ICD-10-CM | POA: Diagnosis not present

## 2016-08-28 DIAGNOSIS — E782 Mixed hyperlipidemia: Secondary | ICD-10-CM | POA: Diagnosis not present

## 2016-08-28 DIAGNOSIS — G4733 Obstructive sleep apnea (adult) (pediatric): Secondary | ICD-10-CM | POA: Diagnosis not present

## 2016-08-28 DIAGNOSIS — I2510 Atherosclerotic heart disease of native coronary artery without angina pectoris: Secondary | ICD-10-CM | POA: Diagnosis not present

## 2016-09-03 ENCOUNTER — Ambulatory Visit: Payer: Medicare Other | Admitting: Endocrinology

## 2016-09-03 DIAGNOSIS — E669 Obesity, unspecified: Secondary | ICD-10-CM | POA: Diagnosis not present

## 2016-09-03 DIAGNOSIS — G4733 Obstructive sleep apnea (adult) (pediatric): Secondary | ICD-10-CM | POA: Diagnosis not present

## 2016-09-03 DIAGNOSIS — Z6838 Body mass index (BMI) 38.0-38.9, adult: Secondary | ICD-10-CM | POA: Diagnosis not present

## 2016-09-03 DIAGNOSIS — E782 Mixed hyperlipidemia: Secondary | ICD-10-CM | POA: Diagnosis not present

## 2016-09-03 DIAGNOSIS — I2510 Atherosclerotic heart disease of native coronary artery without angina pectoris: Secondary | ICD-10-CM | POA: Diagnosis not present

## 2016-09-03 DIAGNOSIS — I10 Essential (primary) hypertension: Secondary | ICD-10-CM | POA: Diagnosis not present

## 2016-09-09 DIAGNOSIS — M4727 Other spondylosis with radiculopathy, lumbosacral region: Secondary | ICD-10-CM | POA: Diagnosis not present

## 2016-09-09 DIAGNOSIS — M5417 Radiculopathy, lumbosacral region: Secondary | ICD-10-CM | POA: Diagnosis not present

## 2016-09-10 ENCOUNTER — Telehealth: Payer: Self-pay

## 2016-09-10 NOTE — Telephone Encounter (Signed)
Patient notified of message and voiced understanding. She had no further questions at this time.  

## 2016-09-10 NOTE — Telephone Encounter (Signed)
Patient called in to report her blood sugar readings. She stated this morning fasting it was 370, at  8 15 am blood sugar was 410 and at 930 am it 330. Patient stated she received a back injection yesterday. She confirmed she is taking Novolin N 70 units before bed and Novolin R before each meal 25-30-30. Could you please advise during Dr. Cordelia Pen absence? Thanks!

## 2016-09-10 NOTE — Telephone Encounter (Signed)
Please increase mealtime insulin: Novolin R before each meal 25-30-30 >> 35-40-40 for few days until the steroid effect decreases. Can take 5 units of R insulin now.

## 2016-09-16 ENCOUNTER — Encounter: Payer: Self-pay | Admitting: Interventional Cardiology

## 2016-09-24 ENCOUNTER — Encounter: Payer: Self-pay | Admitting: Endocrinology

## 2016-09-24 ENCOUNTER — Ambulatory Visit (INDEPENDENT_AMBULATORY_CARE_PROVIDER_SITE_OTHER): Payer: Medicare Other | Admitting: Endocrinology

## 2016-09-24 VITALS — Ht 63.0 in

## 2016-09-24 DIAGNOSIS — E0829 Diabetes mellitus due to underlying condition with other diabetic kidney complication: Secondary | ICD-10-CM | POA: Diagnosis not present

## 2016-09-24 LAB — POCT GLYCOSYLATED HEMOGLOBIN (HGB A1C): HEMOGLOBIN A1C: 6.5

## 2016-09-24 NOTE — Patient Instructions (Addendum)
Please go back to the insulin amounts listed below.   Please make a follow-up appointment in 4-5 months.   check your blood sugar 2 times a day.  vary the time of day when you check, between before the 3 meals, and at bedtime.  also check if you have symptoms of your blood sugar being too high or too low.  please keep a record of the readings and bring it to your next appointment here.  please call us sooner if your blood sugar goes below 70, or if it stays over 200.

## 2016-09-24 NOTE — Progress Notes (Signed)
Subjective:    Patient ID: Brittany Rojas, female    DOB: 12/16/1945, 71 y.o.   MRN: [id number]  HPI Pt returns for f/u of diabetes mellitus: DM type: Insulin-requiring type 2. Dx'ed: 1996. Complications: polyneuropathy, TIA, and CAD.  herapy: insulin since 2005 GDM: never. DKA: never. Severe hypoglycemia: twice (2011 and 2016).   Pancreatitis: never.   Other:  she takes multiple daily injections; she declines pump rx; she takes human insulin, due to cost; she is retired.   Interval history: She recently had another steroid injection into the lower back.  cbg increased to 400.  She increased the insulin for a few days, and it returned to the low-100's.  she brings a record of her cbg's which I have reviewed today.  Aside from the above, it varies from 70-150.  There is no trend throughout the day.   Past Medical History:  Diagnosis Date  . Bronchitis    "touch q now and then" (02/17/2012)  . Depression   . Diverticulitis   . History of blood transfusion 1945   "when I was 24 days old" (02/17/2012)  . History of transient ischemic attack (TIA) 2001   "possibly; never really pinned down what it was" (02/17/2012)  . Hyperlipidemia   . Hypertension   . Migraines    "outgrew them I guess" (02/17/2012)  . OSA on CPAP   . Pneumonia 12/2010  . Shortness of breath    "occasionally; at any time" (02/17/2012)  . Type II diabetes mellitus ([place])     Past Surgical History:  Procedure Laterality Date  . ABDOMINAL HYSTERECTOMY  2000's  . APPENDECTOMY  04/1988  . CARDIAC CATHETERIZATION  02/13/2012  . COLECTOMY  01/18/1988   "took 3 feet out; result of lots of burst diverticula" (02/17/2012)  . COLECTOMY  04/1988   "9 more inches of colon" (02/17/2012)  . COLOSTOMY  01/18/1988  . COLOSTOMY REVERSAL  04/1988  . CORONARY ANGIOPLASTY WITH STENT PLACEMENT  02/17/2012   "1; first one ever" (02/17/2012)  . LEFT OOPHORECTOMY  1989  . PERCUTANEOUS CORONARY STENT INTERVENTION (PCI-S) N/A 02/17/2012   Procedure:  PERCUTANEOUS CORONARY STENT INTERVENTION (PCI-S);  Surgeon: Sinclair Grooms, MD;  Location: [facility] CATH LAB;  Service: Cardiovascular;  Laterality: N/A;    Social History   Social History  . Marital status: Married    Spouse name: N/A  . Number of children: N/A  . Years of education: N/A   Occupational History  . Not on file.   Social History Main Topics  . Smoking status: Former Smoker    Years: 2.00    Types: Cigarettes    Quit date: 11/11/1969  . Smokeless tobacco: Never Used  . Alcohol use No  . Drug use: No  . Sexual activity: Not Currently   Other Topics Concern  . Not on file   Social History Narrative  . No narrative on file    Current Outpatient Prescriptions on File Prior to Visit  Medication Sig Dispense Refill  . albuterol (PROAIR HFA) 108 (90 BASE) MCG/ACT inhaler Inhale 2 puffs into the lungs every 4 (four) hours as needed. For shortness of breath    . aspirin 325 MG tablet Take 1 tablet (325 mg total) by mouth daily.    Marland Kitchen atorvastatin (LIPITOR) 20 MG tablet     . gabapentin (NEURONTIN) 300 MG capsule Take 3 capsules (900 mg total) by mouth at bedtime. 270 capsule 3  . insulin NPH (HUMULIN N,NOVOLIN N)  100 UNIT/ML injection Inject 55 Units into the skin at bedtime.     . Insulin Pen Needle (BD ULTRA-FINE PEN NEEDLES) 29G X 12.7MM MISC by Does not apply route 3 (three) times daily.      . insulin regular (NOVOLIN R,HUMULIN R) 100 units/mL injection 3 times a day (just before each meal), 15-20-20 units, and syringes 4/month.    . losartan-hydrochlorothiazide (HYZAAR) 100-25 MG tablet     . Multiple Vitamin (MULTIVITAMIN WITH MINERALS) TABS Take 1 tablet by mouth daily.    . nitroGLYCERIN (NITROSTAT) 0.4 MG SL tablet Place 1 tablet (0.4 mg total) under the tongue every 5 (five) minutes as needed for chest pain. 25 tablet 3  . traMADol (ULTRAM) 50 MG tablet Take by mouth every 6 (six) hours as needed for moderate pain.     . [DISCONTINUED]  telmisartan-hydrochlorothiazide (MICARDIS HCT) 80-12.5 MG per tablet Take 1 tablet by mouth daily.       No current facility-administered medications on file prior to visit.     Allergies  Allergen Reactions  . Erythromycin Other (See Comments)    "upset stomach" (02/17/2012)  . Penicillins Other (See Comments)    ">50 yr ago, I had strept throat; had gotten lots and lots of PCN; last dose caused me to stop breathing" (02/17/2012)  . Tetracycline Other (See Comments)    "upset stomach" (02/17/2012)  . Sulfonamide Derivatives Rash and Other (See Comments)    "upset stomach" (02/17/2012)    Family History  Problem Relation Age of Onset  . Diabetes Mother   . Angina Mother   . Lung cancer Father   . Other Sister        BRAIN TUMOR    Ht 5\' 3"  (1.6 m)   Review of Systems She denies hypoglycemia.     Objective:   Physical Exam VITAL SIGNS:  See vs page.  GENERAL: no distress.  Pulses: dorsalis pedis intact bilat.  MSK: no deformity of the feet. Old healed surgical scar on the right foot (hammer toe surgery).   CV: 1+ bilat leg edema.  Skin: no ulcer on the feet. normal color and temp on the feet.   Neuro: sensation is intact to touch on the feet, but decreased from normal.  Ext: There is bilateral onychomycosis of the toenails.   Lab Results  Component Value Date   HGBA1C 6.5 09/24/2016      Assessment & Plan:  Insulin-requiring type 2 DM, with CAD: overcontrolled.  Back pain, new: this is affecting insulin need, but this has decreased back to normal.   H/o severe hypoglycemia. We need to reduce insulin to avoid recurrence.    Patient Instructions  Please go back to the insulin amounts listed below.   Please make a follow-up appointment in 4-5 months.   check your blood sugar 2 times a day.  vary the time of day when you check, between before the 3 meals, and at bedtime.  also check if you have symptoms of your blood sugar being too high or too low.  please keep a record  of the readings and bring it to your next appointment here.  please call us sooner if your blood sugar goes below 70, or if it stays over 200.

## 2016-09-29 NOTE — Progress Notes (Signed)
Cardiology Office Note    Date:  10/01/2016   ID:  Brittany, Rojas [birth year], MRN [id number]  PCP:  [name], MD  Cardiologist: Sinclair Grooms, MD   Chief Complaint  Patient presents with  . Coronary Artery Disease    History of Present Illness:  Brittany Rojas is a 71 y.o. female who presents for obtuse marginal #1 DES 2014, hypertension, obstructive sleep apnea, and hyperlipidemia.  She is doing well. She denies angina. No nitroglycerin use. She denies dyspnea on exertion. No lower extremity swelling. He uses C Pap.  Past Medical History:  Diagnosis Date  . Bronchitis    "touch q now and then" (02/17/2012)  . Depression   . Diverticulitis   . History of blood transfusion 1945   "when I was 35 days old" (02/17/2012)  . History of transient ischemic attack (TIA) 2001   "possibly; never really pinned down what it was" (02/17/2012)  . Hyperlipidemia   . Hypertension   . Migraines    "outgrew them I guess" (02/17/2012)  . OSA on CPAP   . Pneumonia 12/2010  . Shortness of breath    "occasionally; at any time" (02/17/2012)  . Type II diabetes mellitus ([place])     Past Surgical History:  Procedure Laterality Date  . ABDOMINAL HYSTERECTOMY  2000's  . APPENDECTOMY  04/1988  . CARDIAC CATHETERIZATION  02/13/2012  . COLECTOMY  01/18/1988   "took 3 feet out; result of lots of burst diverticula" (02/17/2012)  . COLECTOMY  04/1988   "9 more inches of colon" (02/17/2012)  . COLOSTOMY  01/18/1988  . COLOSTOMY REVERSAL  04/1988  . CORONARY ANGIOPLASTY WITH STENT PLACEMENT  02/17/2012   "1; first one ever" (02/17/2012)  . LEFT OOPHORECTOMY  1989  . PERCUTANEOUS CORONARY STENT INTERVENTION (PCI-S) N/A 02/17/2012   Procedure: PERCUTANEOUS CORONARY STENT INTERVENTION (PCI-S);  Surgeon: Sinclair Grooms, MD;  Location: [facility] CATH LAB;  Service: Cardiovascular;  Laterality: N/A;    Current Medications: Outpatient Medications Prior to Visit  Medication Sig Dispense Refill  . albuterol (PROAIR  HFA) 108 (90 BASE) MCG/ACT inhaler Inhale 2 puffs into the lungs every 4 (four) hours as needed. For shortness of breath    . aspirin 325 MG tablet Take 1 tablet (325 mg total) by mouth daily.    Marland Kitchen atorvastatin (LIPITOR) 20 MG tablet Take 20 mg by mouth daily.     Marland Kitchen gabapentin (NEURONTIN) 300 MG capsule Take 3 capsules (900 mg total) by mouth at bedtime. 270 capsule 3  . Insulin Pen Needle (BD ULTRA-FINE PEN NEEDLES) 29G X 12.7MM MISC by Does not apply route 3 (three) times daily.      . insulin regular (NOVOLIN R,HUMULIN R) 100 units/mL injection 3 times a day (just before each meal), 15-20-20 units, and syringes 4/month.    . losartan-hydrochlorothiazide (HYZAAR) 100-25 MG tablet Take 1 tablet by mouth daily.     . Multiple Vitamin (MULTIVITAMIN WITH MINERALS) TABS Take 1 tablet by mouth daily.    . nitroGLYCERIN (NITROSTAT) 0.4 MG SL tablet Place 1 tablet (0.4 mg total) under the tongue every 5 (five) minutes as needed for chest pain. 25 tablet 3  . insulin NPH (HUMULIN N,NOVOLIN N) 100 UNIT/ML injection Inject 55 Units into the skin at bedtime.     . traMADol (ULTRAM) 50 MG tablet Take by mouth every 6 (six) hours as needed for moderate pain.      No facility-administered medications prior to  visit.      Allergies:   Erythromycin; Penicillins; Tetracycline; and Sulfonamide derivatives   Social History   Social History  . Marital status: Married    Spouse name: N/A  . Number of children: N/A  . Years of education: N/A   Social History Main Topics  . Smoking status: Former Smoker    Years: 2.00    Types: Cigarettes    Quit date: 11/11/1969  . Smokeless tobacco: Never Used  . Alcohol use No  . Drug use: No  . Sexual activity: Not Currently   Other Topics Concern  . None   Social History Narrative  . None     Family History:  The patient's family history includes Angina in her mother; Diabetes in her mother; Lung cancer in her father; Other in her sister.   ROS:   Please  see the history of present illness.    None  All other systems reviewed and are negative.   PHYSICAL EXAM:   VS:  BP (!) 100/56 (BP Location: Left Arm)   Pulse 93   Ht 5\' 3"  (1.6 m)   Wt 213 lb 12.8 oz (97 kg)   BMI 37.87 kg/m    GEN: Well nourished, well developed, in no acute distress . Morbid obesity. HEENT: normal  Neck: no JVD, carotid bruits, or masses Cardiac: RRR; no murmurs, rubs, or gallops,no edema  Respiratory:  clear to auscultation bilaterally, normal work of breathing GI: soft, nontender, nondistended, + BS MS: no deformity or atrophy  Skin: warm and dry, no rash Neuro:  Alert and Oriented x 3, Strength and sensation are intact Psych: euthymic mood, full affect  Wt Readings from Last 3 Encounters:  10/01/16 213 lb 12.8 oz (97 kg)  05/07/16 205 lb (93 kg)  01/09/16 204 lb (92.5 kg)      Studies/Labs Reviewed:   EKG:  EKG  Normal sinus rhythm at 93 bpm with inferior Q waves in 3 and aVF.  Recent Labs: No results found for requested labs within last 8760 hours.   Lipid Panel No results found for: CHOL, TRIG, HDL, CHOLHDL, VLDL, LDLCALC, LDLDIRECT  Additional studies/ records that were reviewed today include:  No new data    ASSESSMENT:    1. Atherosclerosis of native coronary artery of native heart without angina pectoris   2. Essential hypertension   3. Sleep apnea, unspecified type   4. Hyperlipidemia, unspecified hyperlipidemia type   5. Diabetes mellitus due to underlying condition with other kidney complication, unspecified whether long term insulin use (HCC)      PLAN:  In order of problems listed above:  1. Doing well without angina. Status post RCA stent. Decrease aspirin 81 mg per day 2. Excellent blood pressure control. Low salt diet and weight loss are discussed. 3. Continue C Pap. 4. Not discussed since lipid when last checked revealed LDL of 80 on current regimen. Target is less than 70. No adjustment as needed. 5. Followed by  primary care  Encouraged aerobic activity, weight loss, clinical follow-up in one year.  Medication Adjustments/Labs and Tests Ordered: Current medicines are reviewed at length with the patient today.  Concerns regarding medicines are outlined above.  Medication changes, Labs and Tests ordered today are listed in the Patient Instructions below. There are no Patient Instructions on file for this visit.   Signed, Sinclair Grooms, MD  10/01/2016 3:08 PM    [facility] [place], [place]  [address] Phone: [phone]; Fax: [phone]

## 2016-10-01 ENCOUNTER — Ambulatory Visit (INDEPENDENT_AMBULATORY_CARE_PROVIDER_SITE_OTHER): Payer: Medicare Other | Admitting: Interventional Cardiology

## 2016-10-01 ENCOUNTER — Encounter: Payer: Self-pay | Admitting: Interventional Cardiology

## 2016-10-01 VITALS — BP 100/56 | HR 93 | Ht 63.0 in | Wt 213.8 lb

## 2016-10-01 DIAGNOSIS — G4730 Sleep apnea, unspecified: Secondary | ICD-10-CM

## 2016-10-01 DIAGNOSIS — E0829 Diabetes mellitus due to underlying condition with other diabetic kidney complication: Secondary | ICD-10-CM | POA: Diagnosis not present

## 2016-10-01 DIAGNOSIS — E785 Hyperlipidemia, unspecified: Secondary | ICD-10-CM | POA: Diagnosis not present

## 2016-10-01 DIAGNOSIS — I2510 Atherosclerotic heart disease of native coronary artery without angina pectoris: Secondary | ICD-10-CM | POA: Diagnosis not present

## 2016-10-01 DIAGNOSIS — I10 Essential (primary) hypertension: Secondary | ICD-10-CM | POA: Diagnosis not present

## 2016-10-01 NOTE — Patient Instructions (Signed)
Medication Instructions:  Your physician has recommended you make the following change in your medication:   DECREASE aspirin to 81 mg by mouth daily.   Labwork: None ordered  Testing/Procedures: None ordered  Follow-Up: Your physician wants you to follow-up in 1 year with Dr. Tamala Julian. You will receive a reminder letter in the mail two months in advance. If you don't receive a letter, please call our office to schedule the follow-up appointment.   Any Other Special Instructions Will Be Listed Below (If Applicable).     If you need a refill on your cardiac medications before your next appointment, please call your pharmacy.

## 2016-10-08 DIAGNOSIS — Z6839 Body mass index (BMI) 39.0-39.9, adult: Secondary | ICD-10-CM | POA: Diagnosis not present

## 2016-10-08 DIAGNOSIS — M545 Low back pain: Secondary | ICD-10-CM | POA: Diagnosis not present

## 2016-10-08 DIAGNOSIS — I10 Essential (primary) hypertension: Secondary | ICD-10-CM | POA: Diagnosis not present

## 2016-10-08 DIAGNOSIS — M4727 Other spondylosis with radiculopathy, lumbosacral region: Secondary | ICD-10-CM | POA: Diagnosis not present

## 2016-11-27 DIAGNOSIS — Z23 Encounter for immunization: Secondary | ICD-10-CM | POA: Diagnosis not present

## 2017-01-17 DIAGNOSIS — M1712 Unilateral primary osteoarthritis, left knee: Secondary | ICD-10-CM | POA: Diagnosis not present

## 2017-01-17 DIAGNOSIS — M1711 Unilateral primary osteoarthritis, right knee: Secondary | ICD-10-CM | POA: Diagnosis not present

## 2017-01-29 DIAGNOSIS — M792 Neuralgia and neuritis, unspecified: Secondary | ICD-10-CM | POA: Diagnosis not present

## 2017-02-18 DIAGNOSIS — E782 Mixed hyperlipidemia: Secondary | ICD-10-CM | POA: Diagnosis not present

## 2017-02-18 DIAGNOSIS — E1165 Type 2 diabetes mellitus with hyperglycemia: Secondary | ICD-10-CM | POA: Diagnosis not present

## 2017-02-18 DIAGNOSIS — S90121A Contusion of right lesser toe(s) without damage to nail, initial encounter: Secondary | ICD-10-CM | POA: Diagnosis not present

## 2017-02-18 DIAGNOSIS — Z794 Long term (current) use of insulin: Secondary | ICD-10-CM | POA: Diagnosis not present

## 2017-02-18 DIAGNOSIS — Z6837 Body mass index (BMI) 37.0-37.9, adult: Secondary | ICD-10-CM | POA: Diagnosis not present

## 2017-02-18 DIAGNOSIS — I10 Essential (primary) hypertension: Secondary | ICD-10-CM | POA: Diagnosis not present

## 2017-02-18 DIAGNOSIS — G4733 Obstructive sleep apnea (adult) (pediatric): Secondary | ICD-10-CM | POA: Diagnosis not present

## 2017-02-18 DIAGNOSIS — I2510 Atherosclerotic heart disease of native coronary artery without angina pectoris: Secondary | ICD-10-CM | POA: Diagnosis not present

## 2017-02-18 DIAGNOSIS — E669 Obesity, unspecified: Secondary | ICD-10-CM | POA: Diagnosis not present

## 2017-02-25 ENCOUNTER — Ambulatory Visit (INDEPENDENT_AMBULATORY_CARE_PROVIDER_SITE_OTHER): Payer: Medicare Other | Admitting: Endocrinology

## 2017-02-25 ENCOUNTER — Encounter: Payer: Self-pay | Admitting: Endocrinology

## 2017-02-25 VITALS — BP 102/58 | HR 78 | Wt 211.2 lb

## 2017-02-25 DIAGNOSIS — E0829 Diabetes mellitus due to underlying condition with other diabetic kidney complication: Secondary | ICD-10-CM | POA: Diagnosis not present

## 2017-02-25 LAB — POCT GLYCOSYLATED HEMOGLOBIN (HGB A1C): HEMOGLOBIN A1C: 6.9

## 2017-02-25 NOTE — Progress Notes (Signed)
Subjective:    Patient ID: Brittany Rojas, female    DOB: 02-Jan-1946, 72 y.o.   MRN: [id number]  HPI Pt returns for f/u of diabetes mellitus: DM type: Insulin-requiring type 2. Dx'ed: 1996. Complications: polyneuropathy, TIA, and CAD.  Brittany Rojas: insulin since 2005 GDM: never. DKA: never. Severe hypoglycemia: twice (2011 and 2016).   Pancreatitis: never.   Other:  she takes multiple daily injections; she declines pump rx; she takes human insulin, due to cost; she is retired.   Interval history: She recently had another steroid injection into the right knee.  cbg increased to 410.  She increased the insulin for a few days, and it returned to the 100's.  she brings a record of her cbg's which I have reviewed today.  Aside from the above, it is well-controlled.  There is no trend throughout the day.  She seldom has hypoglycemia, and these episodes are mild.   Past Medical History:  Diagnosis Date  . Bronchitis    "touch q now and then" (02/17/2012)  . Depression   . Diverticulitis   . History of blood transfusion 1945   "when I was 89 days old" (02/17/2012)  . History of transient ischemic attack (TIA) 2001   "possibly; never really pinned down what it was" (02/17/2012)  . Hyperlipidemia   . Hypertension   . Migraines    "outgrew them I guess" (02/17/2012)  . OSA on CPAP   . Pneumonia 12/2010  . Shortness of breath    "occasionally; at any time" (02/17/2012)  . Type II diabetes mellitus ([place])     Past Surgical History:  Procedure Laterality Date  . ABDOMINAL HYSTERECTOMY  2000's  . APPENDECTOMY  04/1988  . CARDIAC CATHETERIZATION  02/13/2012  . COLECTOMY  01/18/1988   "took 3 feet out; result of lots of burst diverticula" (02/17/2012)  . COLECTOMY  04/1988   "9 more inches of colon" (02/17/2012)  . COLOSTOMY  01/18/1988  . COLOSTOMY REVERSAL  04/1988  . CORONARY ANGIOPLASTY WITH STENT PLACEMENT  02/17/2012   "1; first one ever" (02/17/2012)  . LEFT OOPHORECTOMY  1989  . PERCUTANEOUS CORONARY  STENT INTERVENTION (PCI-S) N/A 02/17/2012   Procedure: PERCUTANEOUS CORONARY STENT INTERVENTION (PCI-S);  Surgeon: Sinclair Grooms, MD;  Location: [facility] CATH LAB;  Service: Cardiovascular;  Laterality: N/A;    Social History   Socioeconomic History  . Marital status: Married    Spouse name: Not on file  . Number of children: Not on file  . Years of education: Not on file  . Highest education level: Not on file  Social Needs  . Financial resource strain: Not on file  . Food insecurity - worry: Not on file  . Food insecurity - inability: Not on file  . Transportation needs - medical: Not on file  . Transportation needs - non-medical: Not on file  Occupational History  . Not on file  Tobacco Use  . Smoking status: Former Smoker    Years: 2.00    Types: Cigarettes    Last attempt to quit: 11/11/1969    Years since quitting: 47.3  . Smokeless tobacco: Never Used  Substance and Sexual Activity  . Alcohol use: No  . Drug use: No  . Sexual activity: Not Currently  Other Topics Concern  . Not on file  Social History Narrative  . Not on file    Current Outpatient Medications on File Prior to Visit  Medication Sig Dispense Refill  . albuterol (PROAIR HFA)  108 (90 BASE) MCG/ACT inhaler Inhale 2 puffs into the lungs every 4 (four) hours as needed. For shortness of breath    . amLODipine (NORVASC) 2.5 MG tablet Take 2.5 mg by mouth daily.    Marland Kitchen aspirin 81 MG tablet Take 81 mg by mouth daily.    Marland Kitchen atorvastatin (LIPITOR) 20 MG tablet Take 20 mg by mouth daily.     Marland Kitchen gabapentin (NEURONTIN) 300 MG capsule Take 3 capsules (900 mg total) by mouth at bedtime. 270 capsule 3  . insulin NPH Human (NOVOLIN N) 100 UNIT/ML injection Inject 55 Units into the skin at bedtime.    . Insulin Pen Needle (BD ULTRA-FINE PEN NEEDLES) 29G X 12.7MM MISC by Does not apply route 3 (three) times daily.      . insulin regular (NOVOLIN R,HUMULIN R) 100 units/mL injection 3 times a day (just before each meal), 15-20-20  units, and syringes 4/month.    . losartan-hydrochlorothiazide (HYZAAR) 100-25 MG tablet Take 1 tablet by mouth daily.     . Multiple Vitamin (MULTIVITAMIN WITH MINERALS) TABS Take 1 tablet by mouth daily.    . nitroGLYCERIN (NITROSTAT) 0.4 MG SL tablet Place 1 tablet (0.4 mg total) under the tongue every 5 (five) minutes as needed for chest pain. 25 tablet 3  . NON FORMULARY Use CPAP machine each night.    . [DISCONTINUED] telmisartan-hydrochlorothiazide (MICARDIS HCT) 80-12.5 MG per tablet Take 1 tablet by mouth daily.       No current facility-administered medications on file prior to visit.     Allergies  Allergen Reactions  . Erythromycin Other (See Comments)    "upset stomach" (02/17/2012)  . Penicillins Other (See Comments)    ">50 yr ago, I had strept throat; had gotten lots and lots of PCN; last dose caused me to stop breathing" (02/17/2012)  . Tetracycline Other (See Comments)    "upset stomach" (02/17/2012)  . Sulfonamide Derivatives Rash and Other (See Comments)    "upset stomach" (02/17/2012)    Family History  Problem Relation Age of Onset  . Diabetes Mother   . Angina Mother   . Lung cancer Father   . Other Sister        BRAIN TUMOR    BP (!) 102/58 (BP Location: Left Wrist, Patient Position: Sitting, Cuff Size: Normal)   Pulse 78   Wt 211 lb 3.2 oz (95.8 kg)   SpO2 98%   BMI 37.41 kg/m    Review of Systems She denies LOC.     Objective:   Physical Exam VITAL SIGNS:  See vs page.  GENERAL: no distress.  Pulses: dorsalis pedis intact bilat.  MSK: no deformity of the feet. Old healed surgical scar on the right foot (hammer toe surgery).   CV: trace bilat leg edema.  Skin: no ulcer on the feet. normal color and temp on the feet.   Neuro: sensation is intact to touch on the feet, but decreased from normal.  Ext: There is bilateral onychomycosis of the toenails.     A1c=6.9%    Assessment & Plan:  Insulin-requiring type 2 DM, with PAD: well-controlled.    Knee pain, new: steroid rx is affecting a1c.    Patient Instructions  Please continue the same insulins.  Please make a follow-up appointment in 4-5 months.   check your blood sugar 2 times a day.  vary the time of day when you check, between before the 3 meals, and at bedtime.  also check if you have symptoms  of your blood sugar being too high or too low.  please keep a record of the readings and bring it to your next appointment here.  please call us sooner if your blood sugar goes below 70, or if it stays over 200.

## 2017-02-25 NOTE — Patient Instructions (Addendum)
Please continue the same insulins.  Please make a follow-up appointment in 4-5 months.   check your blood sugar 2 times a day.  vary the time of day when you check, between before the 3 meals, and at bedtime.  also check if you have symptoms of your blood sugar being too high or too low.  please keep a record of the readings and bring it to your next appointment here.  please call us sooner if your blood sugar goes below 70, or if it stays over 200.

## 2017-03-04 DIAGNOSIS — H401131 Primary open-angle glaucoma, bilateral, mild stage: Secondary | ICD-10-CM | POA: Diagnosis not present

## 2017-03-04 DIAGNOSIS — H25013 Cortical age-related cataract, bilateral: Secondary | ICD-10-CM | POA: Diagnosis not present

## 2017-03-04 DIAGNOSIS — H401121 Primary open-angle glaucoma, left eye, mild stage: Secondary | ICD-10-CM | POA: Diagnosis not present

## 2017-03-04 DIAGNOSIS — H401111 Primary open-angle glaucoma, right eye, mild stage: Secondary | ICD-10-CM | POA: Diagnosis not present

## 2017-03-04 DIAGNOSIS — H2513 Age-related nuclear cataract, bilateral: Secondary | ICD-10-CM | POA: Diagnosis not present

## 2017-03-18 DIAGNOSIS — H25013 Cortical age-related cataract, bilateral: Secondary | ICD-10-CM | POA: Diagnosis not present

## 2017-03-18 DIAGNOSIS — H2513 Age-related nuclear cataract, bilateral: Secondary | ICD-10-CM | POA: Diagnosis not present

## 2017-03-18 DIAGNOSIS — H401131 Primary open-angle glaucoma, bilateral, mild stage: Secondary | ICD-10-CM | POA: Diagnosis not present

## 2017-04-01 DIAGNOSIS — H401131 Primary open-angle glaucoma, bilateral, mild stage: Secondary | ICD-10-CM | POA: Diagnosis not present

## 2017-04-04 DIAGNOSIS — M1712 Unilateral primary osteoarthritis, left knee: Secondary | ICD-10-CM | POA: Diagnosis not present

## 2017-04-15 DIAGNOSIS — H401121 Primary open-angle glaucoma, left eye, mild stage: Secondary | ICD-10-CM | POA: Diagnosis not present

## 2017-04-15 DIAGNOSIS — H2513 Age-related nuclear cataract, bilateral: Secondary | ICD-10-CM | POA: Diagnosis not present

## 2017-04-15 DIAGNOSIS — H25013 Cortical age-related cataract, bilateral: Secondary | ICD-10-CM | POA: Diagnosis not present

## 2017-04-15 DIAGNOSIS — H401111 Primary open-angle glaucoma, right eye, mild stage: Secondary | ICD-10-CM | POA: Diagnosis not present

## 2017-04-22 DIAGNOSIS — G4733 Obstructive sleep apnea (adult) (pediatric): Secondary | ICD-10-CM | POA: Diagnosis not present

## 2017-05-08 ENCOUNTER — Encounter: Payer: Self-pay | Admitting: Sports Medicine

## 2017-05-08 ENCOUNTER — Ambulatory Visit (INDEPENDENT_AMBULATORY_CARE_PROVIDER_SITE_OTHER): Payer: Medicare Other | Admitting: Sports Medicine

## 2017-05-08 DIAGNOSIS — L989 Disorder of the skin and subcutaneous tissue, unspecified: Secondary | ICD-10-CM

## 2017-05-08 DIAGNOSIS — E119 Type 2 diabetes mellitus without complications: Secondary | ICD-10-CM

## 2017-05-08 DIAGNOSIS — M79672 Pain in left foot: Secondary | ICD-10-CM

## 2017-05-08 DIAGNOSIS — L84 Corns and callosities: Secondary | ICD-10-CM | POA: Diagnosis not present

## 2017-05-08 DIAGNOSIS — M79671 Pain in right foot: Secondary | ICD-10-CM | POA: Diagnosis not present

## 2017-05-08 NOTE — Progress Notes (Signed)
Subjective: Brittany Rojas is a 72 y.o. female patient with history of diabetes who presents to office today complaining of painful callus; unable to trim. Patient states that the glucose reading this morning was 126 mg/dl.  Last A1c 6.  Patient states her primary care is Dr. Jacelyn Grip and her endocrinologist is Dr. Loanne Drilling.  Patient denies any new changes in medication or new problems.  Review of Systems  All other systems reviewed and are negative.    Patient Active Problem List   Diagnosis Date Noted  . Coronary atherosclerosis of native coronary artery 12/08/2012  . DM (diabetes mellitus) ([place]) 02/28/2012  . Sleep apnea 10/29/2011  . NEUROPATHY 06/14/2008  . Hyperlipidemia 04/29/2007  . EXOGENOUS OBESITY 04/29/2007  . DEPRESSION 04/29/2007  . Essential hypertension 04/29/2007  . PERIPHERAL EDEMA 04/29/2007  . History of cardiovascular disorder 04/29/2007  . DIVERTICULITIS, HX OF 04/29/2007   Current Outpatient Medications on File Prior to Visit  Medication Sig Dispense Refill  . amLODipine (NORVASC) 2.5 MG tablet Take 2.5 mg by mouth daily.    Marland Kitchen aspirin 81 MG tablet Take 81 mg by mouth daily.    Marland Kitchen atorvastatin (LIPITOR) 20 MG tablet Take 20 mg by mouth daily.     Marland Kitchen gabapentin (NEURONTIN) 300 MG capsule Take 3 capsules (900 mg total) by mouth at bedtime. 270 capsule 3  . losartan-hydrochlorothiazide (HYZAAR) 100-25 MG tablet Take 1 tablet by mouth daily.     . nitroGLYCERIN (NITROSTAT) 0.4 MG SL tablet Place 1 tablet (0.4 mg total) under the tongue every 5 (five) minutes as needed for chest pain. 25 tablet 3  . NON FORMULARY Use CPAP machine each night.    . [DISCONTINUED] telmisartan-hydrochlorothiazide (MICARDIS HCT) 80-12.5 MG per tablet Take 1 tablet by mouth daily.       No current facility-administered medications on file prior to visit.    Allergies  Allergen Reactions  . Erythromycin Other (See Comments)    "upset stomach" (02/17/2012)  . Penicillins Other (See Comments)   ">50 yr ago, I had strept throat; had gotten lots and lots of PCN; last dose caused me to stop breathing" (02/17/2012)  . Tetracycline Other (See Comments)    "upset stomach" (02/17/2012)  . Sulfonamide Derivatives Rash and Other (See Comments)    "upset stomach" (02/17/2012)    Recent Results (from the past 2160 hour(s))  POCT HgB A1C     Status: None   Collection Time: 02/25/17  2:45 PM  Result Value Ref Range   Hemoglobin A1C 6.9     Objective: General: Patient is awake, alert, and oriented x 3 and in no acute distress.  Integument: Skin is warm, dry and supple bilateral. Nails short and, thickened and  dystrophic with subungual debris, consistent with onychomycosis, 1-5 bilateral. No signs of infection.  Callus right sub-met 2 and left medial bunion. Remaining integument unremarkable.  Vasculature:  Dorsalis Pedis pulse 1/4 bilateral. Posterior Tibial pulse  1/4 bilateral.  Capillary fill time <3 sec 1-5 bilateral.  Scant hair growth to the level of the digits. Temperature gradient within normal limits.  Moderate varicosities present bilateral. No edema present bilateral.   Neurology: The patient has intact sensation measured with a 5.07/10g Semmes Weinstein Monofilament at all pedal sites bilateral . Vibratory sensation diminished bilateral with tuning fork. No Babinski sign present bilateral.   Musculoskeletal: Bunion and hammertoe with pes planus pedal deformities noted bilateral. Muscular strength 5/5 in all lower extremity muscular groups bilateral without pain on range of motion .  No tenderness with calf compression bilateral.  Assessment and Plan: Problem List Items Addressed This Visit    None    Visit Diagnoses    Encounter for comprehensive diabetic foot examination, type 2 diabetes mellitus ([place])    -  Primary   Callus of foot       Benign skin lesion       Foot pain, bilateral          -Examined patient. -Discussed and educated patient on diabetic foot care,  especially with  regards to the vascular, neurological and musculoskeletal systems.  -Stressed the importance of good glycemic control and the detriment of not  controlling glucose levels in relation to the foot. -Mechanically debrided callus x2 bilateral using sterile nail nipper and filed with dremel without incident  -[employer] diabetic shoe order form was completed; office to contact primary care for approval / certification;  Office to arrange shoe fitting and dispensing. -Answered all patient questions -Patient to return diabetic shoe measurements. -Patient advised to call the office if any problems or questions arise in the meantime.  Landis Martins, DPM

## 2017-05-08 NOTE — Patient Instructions (Signed)

## 2017-06-03 DIAGNOSIS — H9193 Unspecified hearing loss, bilateral: Secondary | ICD-10-CM | POA: Diagnosis not present

## 2017-06-03 DIAGNOSIS — H6123 Impacted cerumen, bilateral: Secondary | ICD-10-CM | POA: Diagnosis not present

## 2017-06-11 ENCOUNTER — Encounter: Payer: Self-pay | Admitting: Sports Medicine

## 2017-06-11 ENCOUNTER — Ambulatory Visit (INDEPENDENT_AMBULATORY_CARE_PROVIDER_SITE_OTHER): Payer: Medicare Other | Admitting: Sports Medicine

## 2017-06-11 DIAGNOSIS — E1142 Type 2 diabetes mellitus with diabetic polyneuropathy: Secondary | ICD-10-CM

## 2017-06-11 DIAGNOSIS — M79674 Pain in right toe(s): Secondary | ICD-10-CM

## 2017-06-11 DIAGNOSIS — B351 Tinea unguium: Secondary | ICD-10-CM | POA: Diagnosis not present

## 2017-06-11 DIAGNOSIS — L84 Corns and callosities: Secondary | ICD-10-CM

## 2017-06-11 DIAGNOSIS — M79672 Pain in left foot: Secondary | ICD-10-CM

## 2017-06-11 DIAGNOSIS — L989 Disorder of the skin and subcutaneous tissue, unspecified: Secondary | ICD-10-CM | POA: Diagnosis not present

## 2017-06-11 DIAGNOSIS — M79671 Pain in right foot: Secondary | ICD-10-CM

## 2017-06-11 DIAGNOSIS — M79675 Pain in left toe(s): Secondary | ICD-10-CM | POA: Diagnosis not present

## 2017-06-11 NOTE — Progress Notes (Signed)
Subjective: Brittany Rojas is a 72 y.o. female patient with history of diabetes who presents to office today complaining of painful callus and nails; unable to trim. Patient states that the glucose reading this morning was 257 mg/dl.  Last A1c 7.  Patient states her primary care is Dr. Jacelyn Grip and her endocrinologist is Dr. Loanne Drilling, last seen 5 months ago.  Patient denies any new changes in medication or new problems.  Patient Active Problem List   Diagnosis Date Noted  . Coronary atherosclerosis of native coronary artery 12/08/2012  . DM (diabetes mellitus) ([place]) 02/28/2012  . Sleep apnea 10/29/2011  . NEUROPATHY 06/14/2008  . Hyperlipidemia 04/29/2007  . EXOGENOUS OBESITY 04/29/2007  . DEPRESSION 04/29/2007  . Essential hypertension 04/29/2007  . PERIPHERAL EDEMA 04/29/2007  . History of cardiovascular disorder 04/29/2007  . DIVERTICULITIS, HX OF 04/29/2007   Current Outpatient Medications on File Prior to Visit  Medication Sig Dispense Refill  . amLODipine (NORVASC) 2.5 MG tablet Take 2.5 mg by mouth daily.    Marland Kitchen aspirin 81 MG tablet Take 81 mg by mouth daily.    Marland Kitchen atorvastatin (LIPITOR) 20 MG tablet Take 20 mg by mouth daily.     Marland Kitchen gabapentin (NEURONTIN) 300 MG capsule Take 3 capsules (900 mg total) by mouth at bedtime. 270 capsule 3  . losartan-hydrochlorothiazide (HYZAAR) 100-25 MG tablet Take 1 tablet by mouth daily.     . nitroGLYCERIN (NITROSTAT) 0.4 MG SL tablet Place 1 tablet (0.4 mg total) under the tongue every 5 (five) minutes as needed for chest pain. 25 tablet 3  . NON FORMULARY Use CPAP machine each night.    . [DISCONTINUED] telmisartan-hydrochlorothiazide (MICARDIS HCT) 80-12.5 MG per tablet Take 1 tablet by mouth daily.       No current facility-administered medications on file prior to visit.    Allergies  Allergen Reactions  . Erythromycin Other (See Comments)    "upset stomach" (02/17/2012)  . Penicillins Other (See Comments)    ">50 yr ago, I had strept throat; had  gotten lots and lots of PCN; last dose caused me to stop breathing" (02/17/2012)  . Tetracycline Other (See Comments)    "upset stomach" (02/17/2012)  . Sulfonamide Derivatives Rash and Other (See Comments)    "upset stomach" (02/17/2012)    No results found for this or any previous visit (from the past 2160 hour(s)).  Objective: General: Patient is awake, alert, and oriented x 3 and in no acute distress.  Integument: Skin is warm, dry and supple bilateral. Nails long and, thickened and  dystrophic with subungual debris, consistent with onychomycosis, 1-5 bilateral. No signs of infection.  Callus right sub-met 2 and left medial bunion. Remaining integument unremarkable.  Vasculature:  Dorsalis Pedis pulse 1/4 bilateral. Posterior Tibial pulse  1/4 bilateral.  Capillary fill time <3 sec 1-5 bilateral.  Scant hair growth to the level of the digits. Temperature gradient within normal limits.  Moderate varicosities present bilateral. No edema present bilateral.   Neurology: The patient has intact sensation measured with a 5.07/10g Semmes Weinstein Monofilament at all pedal sites bilateral . Vibratory sensation diminished bilateral with tuning fork. No Babinski sign present bilateral.   Musculoskeletal: Bunion and hammertoe with pes planus pedal deformities noted bilateral. Muscular strength 5/5 in all lower extremity muscular groups bilateral without pain on range of motion . No tenderness with calf compression bilateral.  Assessment and Plan: Problem List Items Addressed This Visit    None    Visit Diagnoses  Pain due to onychomycosis of toenails of both feet    -  Primary   Callus of foot       Benign skin lesion       Foot pain, bilateral       Diabetic polyneuropathy associated with type 2 diabetes mellitus ([place])         -Examined patient. -Discussed and educated patient on diabetic foot care, especially with  regards to the vascular, neurological and musculoskeletal systems.   -Stressed the importance of good glycemic control and the detriment of not  controlling glucose levels in relation to the foot. -Mechanically debrided all nails using a sterile nail nipper and debrided callus x2 bilateral using sterile chisel blade and filed with dremel without incident  -Patient is awaiting diabetic shoes -Answered all patient questions -Patient to return in 16 days for nail and callus care and for shoe measurements when called -Patient advised to call the office if any problems or questions arise in the meantime.  Landis Martins, DPM

## 2017-07-01 ENCOUNTER — Ambulatory Visit (INDEPENDENT_AMBULATORY_CARE_PROVIDER_SITE_OTHER): Payer: Medicare Other | Admitting: Endocrinology

## 2017-07-01 ENCOUNTER — Encounter: Payer: Self-pay | Admitting: Endocrinology

## 2017-07-01 VITALS — BP 144/58 | HR 80 | Wt 219.4 lb

## 2017-07-01 DIAGNOSIS — M25569 Pain in unspecified knee: Secondary | ICD-10-CM | POA: Diagnosis not present

## 2017-07-01 DIAGNOSIS — E1159 Type 2 diabetes mellitus with other circulatory complications: Secondary | ICD-10-CM | POA: Diagnosis not present

## 2017-07-01 LAB — POCT GLYCOSYLATED HEMOGLOBIN (HGB A1C): Hemoglobin A1C: 7.1 % — AB (ref 4.0–5.6)

## 2017-07-01 MED ORDER — INSULIN REGULAR HUMAN 100 UNIT/ML IJ SOLN: INTRAMUSCULAR | 11 refills | Status: DC

## 2017-07-01 MED ORDER — INSULIN NPH (HUMAN) (ISOPHANE) 100 UNIT/ML ~~LOC~~ SUSP: 65.0000 [IU] | Freq: Every day | SUBCUTANEOUS | 3 refills | Status: DC

## 2017-07-01 NOTE — Patient Instructions (Addendum)
Please reduce the NPH to 65 units at bedtime Please make a follow-up appointment in 4-5 months.   After you get a steroid injection, you can take extra regular: 5 units if it is in tje 200's, and 10 extra if it is over 300.   check your blood sugar 2 times a day.  vary the time of day when you check, between before the 3 meals, and at bedtime.  also check if you have symptoms of your blood sugar being too high or too low.  please keep a record of the readings and bring it to your next appointment here.  please call us sooner if your blood sugar goes below 70, or if it stays over 200.

## 2017-07-01 NOTE — Progress Notes (Signed)
Subjective:    Patient ID: Brittany Rojas, female    DOB: 1945-07-06, 72 y.o.   MRN: [id number]  HPI Pt returns for f/u of diabetes mellitus: DM type: Insulin-requiring type 2. Dx'ed: 1996. Complications: polyneuropathy, TIA, and CAD.  herapy: insulin since 2005 GDM: never. DKA: never. Severe hypoglycemia: twice (2011 and 2016).   Pancreatitis: never.   Other:  she takes multiple daily injections; she declines pump rx; she takes human insulin, due to cost; she is retired.   Interval history: She recently had more steroid injections into the knees. She increased the insulin for a few days, and it returned to the 100's.  she brings a record of her cbg's which I have reviewed today.  Aside from the above, it is well-controlled.  It is in general higher as the day goes on.  She seldom has hypoglycemia, and these episodes are mild.   Past Medical History:  Diagnosis Date  . Bronchitis    "touch q now and then" (02/17/2012)  . Depression   . Diverticulitis   . History of blood transfusion 1945   "when I was 64 days old" (02/17/2012)  . History of transient ischemic attack (TIA) 2001   "possibly; never really pinned down what it was" (02/17/2012)  . Hyperlipidemia   . Hypertension   . Migraines    "outgrew them I guess" (02/17/2012)  . OSA on CPAP   . Pneumonia 12/2010  . Shortness of breath    "occasionally; at any time" (02/17/2012)  . Type II diabetes mellitus ([place])     Past Surgical History:  Procedure Laterality Date  . ABDOMINAL HYSTERECTOMY  2000's  . APPENDECTOMY  04/1988  . CARDIAC CATHETERIZATION  02/13/2012  . COLECTOMY  01/18/1988   "took 3 feet out; result of lots of burst diverticula" (02/17/2012)  . COLECTOMY  04/1988   "9 more inches of colon" (02/17/2012)  . COLOSTOMY  01/18/1988  . COLOSTOMY REVERSAL  04/1988  . CORONARY ANGIOPLASTY WITH STENT PLACEMENT  02/17/2012   "1; first one ever" (02/17/2012)  . LEFT OOPHORECTOMY  1989  . PERCUTANEOUS CORONARY STENT INTERVENTION (PCI-S)  N/A 02/17/2012   Procedure: PERCUTANEOUS CORONARY STENT INTERVENTION (PCI-S);  Surgeon: Sinclair Grooms, MD;  Location: [facility] CATH LAB;  Service: Cardiovascular;  Laterality: N/A;    Social History   Socioeconomic History  . Marital status: Married    Spouse name: Not on file  . Number of children: Not on file  . Years of education: Not on file  . Highest education level: Not on file  Occupational History  . Not on file  Social Needs  . Financial resource strain: Not on file  . Food insecurity:    Worry: Not on file    Inability: Not on file  . Transportation needs:    Medical: Not on file    Non-medical: Not on file  Tobacco Use  . Smoking status: Former Smoker    Years: 2.00    Types: Cigarettes    Last attempt to quit: 11/11/1969    Years since quitting: 47.6  . Smokeless tobacco: Never Used  Substance and Sexual Activity  . Alcohol use: No  . Drug use: No  . Sexual activity: Not Currently  Lifestyle  . Physical activity:    Days per week: Not on file    Minutes per session: Not on file  . Stress: Not on file  Relationships  . Social connections:    Talks on phone: Not  on file    Gets together: Not on file    Attends religious service: Not on file    Active member of club or organization: Not on file    Attends meetings of clubs or organizations: Not on file    Relationship status: Not on file  . Intimate partner violence:    Fear of current or ex partner: Not on file    Emotionally abused: Not on file    Physically abused: Not on file    Forced sexual activity: Not on file  Other Topics Concern  . Not on file  Social History Narrative  . Not on file    Current Outpatient Medications on File Prior to Visit  Medication Sig Dispense Refill  . amLODipine (NORVASC) 2.5 MG tablet Take 2.5 mg by mouth daily.    Marland Kitchen aspirin 81 MG tablet Take 81 mg by mouth daily.    Marland Kitchen atorvastatin (LIPITOR) 20 MG tablet Take 20 mg by mouth daily.     Marland Kitchen gabapentin (NEURONTIN) 300 MG  capsule Take 3 capsules (900 mg total) by mouth at bedtime. 270 capsule 3  . losartan-hydrochlorothiazide (HYZAAR) 100-25 MG tablet Take 1 tablet by mouth daily.     . nitroGLYCERIN (NITROSTAT) 0.4 MG SL tablet Place 1 tablet (0.4 mg total) under the tongue every 5 (five) minutes as needed for chest pain. 25 tablet 3  . NON FORMULARY Use CPAP machine each night.    . [DISCONTINUED] telmisartan-hydrochlorothiazide (MICARDIS HCT) 80-12.5 MG per tablet Take 1 tablet by mouth daily.       No current facility-administered medications on file prior to visit.     Allergies  Allergen Reactions  . Erythromycin Other (See Comments)    "upset stomach" (02/17/2012)  . Penicillins Other (See Comments)    ">50 yr ago, I had strept throat; had gotten lots and lots of PCN; last dose caused me to stop breathing" (02/17/2012)  . Tetracycline Other (See Comments)    "upset stomach" (02/17/2012)  . Sulfonamide Derivatives Rash and Other (See Comments)    "upset stomach" (02/17/2012)    Family History  Problem Relation Age of Onset  . Diabetes Mother   . Angina Mother   . Lung cancer Father   . Other Sister        BRAIN TUMOR    BP (!) 144/58   Pulse 80   Wt 219 lb 6.4 oz (99.5 kg)   SpO2 92%   BMI 38.86 kg/m    Review of Systems Denies LOC    Objective:   Physical Exam VITAL SIGNS:  See vs page.  GENERAL: no distress.  Pulses: dorsalis pedis intact bilat.  MSK: no deformity of the feet. Old healed surgical scar on the right foot (hammer toe surgery).   CV: 1+ bilat leg edema.  Skin: no ulcer on the feet. normal color and temp on the feet.   Neuro: sensation is intact to touch on the feet, but decreased from normal.  Ext: There is bilateral onychomycosis of the toenails.     Lab Results  Component Value Date   HGBA1C 7.1 (A) 07/01/2017      Assessment & Plan:  Insulin-requiring type 2 DM, with CAD: this is the best control this pt should aim for, given this regimen, which does match  insulin to her changing needs throughout the day Hypoglycemia: this limits aggressiveness of glycemic control Knee pain: steroid injections are affecting glycemic control  Patient Instructions  Please reduce the NPH  to 65 units at bedtime Please make a follow-up appointment in 4-5 months.   After you get a steroid injection, you can take extra regular: 5 units if it is in tje 200's, and 10 extra if it is over 300.   check your blood sugar 2 times a day.  vary the time of day when you check, between before the 3 meals, and at bedtime.  also check if you have symptoms of your blood sugar being too high or too low.  please keep a record of the readings and bring it to your next appointment here.  please call us sooner if your blood sugar goes below 70, or if it stays over 200.

## 2017-07-03 DIAGNOSIS — M5417 Radiculopathy, lumbosacral region: Secondary | ICD-10-CM | POA: Diagnosis not present

## 2017-07-03 DIAGNOSIS — M4727 Other spondylosis with radiculopathy, lumbosacral region: Secondary | ICD-10-CM | POA: Diagnosis not present

## 2017-07-22 DIAGNOSIS — M545 Low back pain: Secondary | ICD-10-CM | POA: Diagnosis not present

## 2017-07-22 DIAGNOSIS — M4727 Other spondylosis with radiculopathy, lumbosacral region: Secondary | ICD-10-CM | POA: Diagnosis not present

## 2017-07-29 DIAGNOSIS — H401131 Primary open-angle glaucoma, bilateral, mild stage: Secondary | ICD-10-CM | POA: Diagnosis not present

## 2017-07-29 DIAGNOSIS — E119 Type 2 diabetes mellitus without complications: Secondary | ICD-10-CM | POA: Diagnosis not present

## 2017-08-05 ENCOUNTER — Other Ambulatory Visit: Payer: Self-pay | Admitting: Endocrinology

## 2017-08-07 DIAGNOSIS — M1712 Unilateral primary osteoarthritis, left knee: Secondary | ICD-10-CM | POA: Diagnosis not present

## 2017-08-19 DIAGNOSIS — E782 Mixed hyperlipidemia: Secondary | ICD-10-CM | POA: Diagnosis not present

## 2017-08-19 DIAGNOSIS — Z6839 Body mass index (BMI) 39.0-39.9, adult: Secondary | ICD-10-CM | POA: Diagnosis not present

## 2017-08-19 DIAGNOSIS — J9801 Acute bronchospasm: Secondary | ICD-10-CM | POA: Diagnosis not present

## 2017-08-19 DIAGNOSIS — G4733 Obstructive sleep apnea (adult) (pediatric): Secondary | ICD-10-CM | POA: Diagnosis not present

## 2017-08-19 DIAGNOSIS — I10 Essential (primary) hypertension: Secondary | ICD-10-CM | POA: Diagnosis not present

## 2017-08-19 DIAGNOSIS — Z794 Long term (current) use of insulin: Secondary | ICD-10-CM | POA: Diagnosis not present

## 2017-08-19 DIAGNOSIS — E1165 Type 2 diabetes mellitus with hyperglycemia: Secondary | ICD-10-CM | POA: Diagnosis not present

## 2017-08-19 DIAGNOSIS — I2510 Atherosclerotic heart disease of native coronary artery without angina pectoris: Secondary | ICD-10-CM | POA: Diagnosis not present

## 2017-08-19 DIAGNOSIS — L84 Corns and callosities: Secondary | ICD-10-CM | POA: Diagnosis not present

## 2017-08-19 DIAGNOSIS — E669 Obesity, unspecified: Secondary | ICD-10-CM | POA: Diagnosis not present

## 2017-08-20 ENCOUNTER — Encounter: Payer: Self-pay | Admitting: Sports Medicine

## 2017-08-20 ENCOUNTER — Ambulatory Visit (INDEPENDENT_AMBULATORY_CARE_PROVIDER_SITE_OTHER): Payer: Medicare Other | Admitting: Sports Medicine

## 2017-08-20 VITALS — BP 123/52 | HR 75 | Temp 98.7°F | Resp 16

## 2017-08-20 DIAGNOSIS — B351 Tinea unguium: Secondary | ICD-10-CM | POA: Diagnosis not present

## 2017-08-20 DIAGNOSIS — E1142 Type 2 diabetes mellitus with diabetic polyneuropathy: Secondary | ICD-10-CM

## 2017-08-20 DIAGNOSIS — M79675 Pain in left toe(s): Secondary | ICD-10-CM

## 2017-08-20 DIAGNOSIS — L84 Corns and callosities: Secondary | ICD-10-CM | POA: Diagnosis not present

## 2017-08-20 DIAGNOSIS — L989 Disorder of the skin and subcutaneous tissue, unspecified: Secondary | ICD-10-CM

## 2017-08-20 DIAGNOSIS — M79674 Pain in right toe(s): Secondary | ICD-10-CM | POA: Diagnosis not present

## 2017-08-20 DIAGNOSIS — M79671 Pain in right foot: Secondary | ICD-10-CM

## 2017-08-20 DIAGNOSIS — T148XXA Other injury of unspecified body region, initial encounter: Secondary | ICD-10-CM

## 2017-08-20 DIAGNOSIS — M79672 Pain in left foot: Secondary | ICD-10-CM

## 2017-08-20 NOTE — Progress Notes (Signed)
Subjective: Brittany Rojas is a 72 y.o. female patient with history of diabetes who presents to office today complaining of painful callus and nails; unable to trim. Patient states that the glucose reading yesterday was 197 mg/dl.  Last A1c 7.  Patient states her primary care is Dr. Jacelyn Grip on yesterday.  Patient denies any new changes in medication or new problems.  Patient Active Problem List   Diagnosis Date Noted  . Coronary atherosclerosis of native coronary artery 12/08/2012  . DM (diabetes mellitus) ([place]) 02/28/2012  . Sleep apnea 10/29/2011  . NEUROPATHY 06/14/2008  . Hyperlipidemia 04/29/2007  . EXOGENOUS OBESITY 04/29/2007  . DEPRESSION 04/29/2007  . Essential hypertension 04/29/2007  . PERIPHERAL EDEMA 04/29/2007  . History of cardiovascular disorder 04/29/2007  . DIVERTICULITIS, HX OF 04/29/2007   Current Outpatient Medications on File Prior to Visit  Medication Sig Dispense Refill  . amLODipine (NORVASC) 2.5 MG tablet Take 2.5 mg by mouth daily.    Marland Kitchen aspirin 81 MG tablet Take 81 mg by mouth daily.    Marland Kitchen atorvastatin (LIPITOR) 20 MG tablet Take 20 mg by mouth daily.     Marland Kitchen gabapentin (NEURONTIN) 300 MG capsule TAKE 3 CAPSULES BY MOUTH AT BEDTIME 270 capsule 3  . insulin NPH Human (HUMULIN N) 100 UNIT/ML injection Inject 0.65 mLs (65 Units total) into the skin at bedtime. 20 mL 3  . insulin regular (HUMULIN R) 100 units/mL injection 3 times a day (just before each meal), 25-30-30 units, and syringes 4/day. 30 mL 11  . losartan-hydrochlorothiazide (HYZAAR) 100-25 MG tablet Take 1 tablet by mouth daily.     . nitroGLYCERIN (NITROSTAT) 0.4 MG SL tablet Place 1 tablet (0.4 mg total) under the tongue every 5 (five) minutes as needed for chest pain. 25 tablet 3  . NON FORMULARY Use CPAP machine each night.    . [DISCONTINUED] telmisartan-hydrochlorothiazide (MICARDIS HCT) 80-12.5 MG per tablet Take 1 tablet by mouth daily.       No current facility-administered medications on file prior  to visit.    Allergies  Allergen Reactions  . Erythromycin Other (See Comments)    "upset stomach" (02/17/2012)  . Penicillins Other (See Comments)    ">50 yr ago, I had strept throat; had gotten lots and lots of PCN; last dose caused me to stop breathing" (02/17/2012)  . Tetracycline Other (See Comments)    "upset stomach" (02/17/2012)  . Sulfonamide Derivatives Rash and Other (See Comments)    "upset stomach" (02/17/2012)    Recent Results (from the past 2160 hour(s))  POCT HgB A1C     Status: Abnormal   Collection Time: 07/01/17  2:29 PM  Result Value Ref Range   Hemoglobin A1C 7.1 (A) 4.0 - 5.6 %   HbA1c, POC (prediabetic range)  5.7 - 6.4 %   HbA1c, POC (controlled diabetic range)  0.0 - 7.0 %    Objective: General: Patient is awake, alert, and oriented x 3 and in no acute distress.  Integument: Skin is warm, dry and supple bilateral. Nails long and, thickened and  dystrophic with subungual debris, consistent with onychomycosis, 1-5 bilateral. No signs of infection.  Callus right sub-met 2 and left medial bunion.  Abrasion to left plantar first toe without signs of infection.  Remaining integument unremarkable.  Vasculature:  Dorsalis Pedis pulse 1/4 bilateral. Posterior Tibial pulse  1/4 bilateral.  Capillary fill time <3 sec 1-5 bilateral.  Scant hair growth to the level of the digits. Temperature gradient within normal limits.  Moderate varicosities present bilateral. No edema present bilateral.   Neurology: The patient has intact sensation measured with a 5.07/10g Semmes Weinstein Monofilament at all pedal sites bilateral . Vibratory sensation diminished bilateral with tuning fork. No Babinski sign present bilateral.   Musculoskeletal: Bunion and hammertoe with pes planus pedal deformities noted bilateral. Muscular strength 5/5 in all lower extremity muscular groups bilateral without pain on range of motion . No tenderness with calf compression bilateral.  Assessment and  Plan: Problem List Items Addressed This Visit    None    Visit Diagnoses    Pain due to onychomycosis of toenails of both feet    -  Primary   Callus of foot       Benign skin lesion       Foot pain, bilateral       Diabetic polyneuropathy associated with type 2 diabetes mellitus ([place])       Abrasion         -Examined patient. -Discussed and educated patient on diabetic foot care, especially with  regards to the vascular, neurological and musculoskeletal systems.  -Stressed the importance of good glycemic control and the detriment of not  controlling glucose levels in relation to the foot. -Mechanically debrided all nails using a sterile nail nipper and debrided callus x2 bilateral using sterile chisel blade and filed with dremel without incident  -Recommend to patient to refrain from walking barefoot and to apply antibiotic cream to abrasion at left great toe until healed -Answered all patient questions -Patient to return in 62 days for nail and callus care  -Patient advised to call the office if any problems or questions arise in the meantime.  Landis Martins, DPM

## 2017-09-07 DIAGNOSIS — R4020 Unspecified coma: Secondary | ICD-10-CM | POA: Diagnosis not present

## 2017-09-08 ENCOUNTER — Telehealth: Payer: Self-pay

## 2017-09-08 NOTE — Telephone Encounter (Signed)
Patient called this morning and stated she woke up yesterday morning and fell when she tried to get out of bed felt disoriented didn't know her name and her blood sugar was 33 husband had to call EMS at 3 am- first time this has happened she wants to know if she should come in and be seen please advise

## 2017-09-08 NOTE — Telephone Encounter (Signed)
Please advise 

## 2017-09-08 NOTE — Telephone Encounter (Signed)
I called patient & she stated that she didn't know why blood sugars would have gotten that low. I asked that she reduce NPH to 50 units. She said that she would do so. I asked that she call to let us know blood sugars in a few days to see if they stay stable. Then we could send in new prescription because she said that current one still says 30 units at bedtime.

## 2017-09-08 NOTE — Telephone Encounter (Signed)
Please reduce the NPH to 50 units at bedtime.  Any reason you can think of, why this happened?

## 2017-09-11 ENCOUNTER — Telehealth: Payer: Self-pay | Admitting: Endocrinology

## 2017-09-11 MED ORDER — FREESTYLE LIBRE 14 DAY READER DEVI: 1.0000 | Freq: Once | 0 refills | Status: AC

## 2017-09-11 MED ORDER — FREESTYLE LIBRE 14 DAY SENSOR MISC: 1.0000 | 5 refills | Status: AC

## 2017-09-11 NOTE — Telephone Encounter (Signed)
Pts grand-daughter is aware, sending CGM

## 2017-09-11 NOTE — Telephone Encounter (Signed)
Please advise on below  

## 2017-09-11 NOTE — Telephone Encounter (Signed)
Patient's granddaughter Karie Soda ph# [phone]- called re: she wanted to bring the following to Dr. Cordelia Pen attention: patient's blood sugar dropped to 33 on 09/07/17. Ambulance was called. She was told (more than once) re:CGM that her insurance doesn't cover it but [facility] said Medicare guidelines would cover it because she tests 4 x per day. Insulin was changed in the last couple of months. Before her insulin was changed she never had these incidents. Since changing insulin patient has had 3 episodes [facility]ona Bar wonders if the change in insulin/dosage could be the reason)

## 2017-09-11 NOTE — Telephone Encounter (Signed)
Please reduce the NPH to 55 units at bedtime. Please come back for a follow-up appointment in 1 month.

## 2017-09-21 DIAGNOSIS — A084 Viral intestinal infection, unspecified: Secondary | ICD-10-CM | POA: Diagnosis not present

## 2017-09-21 DIAGNOSIS — I10 Essential (primary) hypertension: Secondary | ICD-10-CM | POA: Diagnosis not present

## 2017-09-21 DIAGNOSIS — E1169 Type 2 diabetes mellitus with other specified complication: Secondary | ICD-10-CM | POA: Diagnosis not present

## 2017-09-21 DIAGNOSIS — E669 Obesity, unspecified: Secondary | ICD-10-CM | POA: Diagnosis not present

## 2017-09-21 DIAGNOSIS — G4733 Obstructive sleep apnea (adult) (pediatric): Secondary | ICD-10-CM | POA: Diagnosis not present

## 2017-09-21 DIAGNOSIS — I2510 Atherosclerotic heart disease of native coronary artery without angina pectoris: Secondary | ICD-10-CM | POA: Diagnosis present

## 2017-09-21 DIAGNOSIS — E119 Type 2 diabetes mellitus without complications: Secondary | ICD-10-CM | POA: Diagnosis present

## 2017-09-21 DIAGNOSIS — Z7982 Long term (current) use of aspirin: Secondary | ICD-10-CM | POA: Diagnosis not present

## 2017-09-21 DIAGNOSIS — Z79899 Other long term (current) drug therapy: Secondary | ICD-10-CM | POA: Diagnosis not present

## 2017-09-21 DIAGNOSIS — Z792 Long term (current) use of antibiotics: Secondary | ICD-10-CM | POA: Diagnosis not present

## 2017-09-21 DIAGNOSIS — K56609 Unspecified intestinal obstruction, unspecified as to partial versus complete obstruction: Secondary | ICD-10-CM | POA: Diagnosis not present

## 2017-09-21 DIAGNOSIS — Z955 Presence of coronary angioplasty implant and graft: Secondary | ICD-10-CM | POA: Diagnosis not present

## 2017-09-21 DIAGNOSIS — K8020 Calculus of gallbladder without cholecystitis without obstruction: Secondary | ICD-10-CM | POA: Diagnosis not present

## 2017-09-21 DIAGNOSIS — K432 Incisional hernia without obstruction or gangrene: Secondary | ICD-10-CM | POA: Diagnosis not present

## 2017-09-21 DIAGNOSIS — R1031 Right lower quadrant pain: Secondary | ICD-10-CM | POA: Diagnosis not present

## 2017-09-21 DIAGNOSIS — Z7902 Long term (current) use of antithrombotics/antiplatelets: Secondary | ICD-10-CM | POA: Diagnosis not present

## 2017-09-21 DIAGNOSIS — D72829 Elevated white blood cell count, unspecified: Secondary | ICD-10-CM | POA: Diagnosis not present

## 2017-09-21 DIAGNOSIS — I252 Old myocardial infarction: Secondary | ICD-10-CM | POA: Diagnosis not present

## 2017-09-21 DIAGNOSIS — K56699 Other intestinal obstruction unspecified as to partial versus complete obstruction: Secondary | ICD-10-CM | POA: Diagnosis not present

## 2017-09-21 DIAGNOSIS — R112 Nausea with vomiting, unspecified: Secondary | ICD-10-CM | POA: Diagnosis not present

## 2017-09-21 DIAGNOSIS — Z794 Long term (current) use of insulin: Secondary | ICD-10-CM | POA: Diagnosis not present

## 2017-09-21 DIAGNOSIS — R1032 Left lower quadrant pain: Secondary | ICD-10-CM | POA: Diagnosis not present

## 2017-09-29 DIAGNOSIS — E1165 Type 2 diabetes mellitus with hyperglycemia: Secondary | ICD-10-CM | POA: Diagnosis not present

## 2017-09-29 DIAGNOSIS — G4733 Obstructive sleep apnea (adult) (pediatric): Secondary | ICD-10-CM | POA: Diagnosis not present

## 2017-09-29 DIAGNOSIS — E162 Hypoglycemia, unspecified: Secondary | ICD-10-CM | POA: Diagnosis not present

## 2017-09-29 DIAGNOSIS — E669 Obesity, unspecified: Secondary | ICD-10-CM | POA: Diagnosis not present

## 2017-09-29 DIAGNOSIS — E782 Mixed hyperlipidemia: Secondary | ICD-10-CM | POA: Diagnosis not present

## 2017-09-29 DIAGNOSIS — I10 Essential (primary) hypertension: Secondary | ICD-10-CM | POA: Diagnosis not present

## 2017-09-29 DIAGNOSIS — I2510 Atherosclerotic heart disease of native coronary artery without angina pectoris: Secondary | ICD-10-CM | POA: Diagnosis not present

## 2017-09-29 DIAGNOSIS — K529 Noninfective gastroenteritis and colitis, unspecified: Secondary | ICD-10-CM | POA: Diagnosis not present

## 2017-10-03 DIAGNOSIS — I10 Essential (primary) hypertension: Secondary | ICD-10-CM | POA: Diagnosis not present

## 2017-10-03 DIAGNOSIS — E1165 Type 2 diabetes mellitus with hyperglycemia: Secondary | ICD-10-CM | POA: Diagnosis not present

## 2017-10-03 DIAGNOSIS — I2510 Atherosclerotic heart disease of native coronary artery without angina pectoris: Secondary | ICD-10-CM | POA: Diagnosis not present

## 2017-10-03 DIAGNOSIS — E7800 Pure hypercholesterolemia, unspecified: Secondary | ICD-10-CM | POA: Diagnosis not present

## 2017-10-22 ENCOUNTER — Encounter: Payer: Self-pay | Admitting: Sports Medicine

## 2017-10-22 ENCOUNTER — Ambulatory Visit (INDEPENDENT_AMBULATORY_CARE_PROVIDER_SITE_OTHER): Payer: Medicare Other | Admitting: Sports Medicine

## 2017-10-22 VITALS — BP 98/45 | HR 75 | Resp 15

## 2017-10-22 DIAGNOSIS — L84 Corns and callosities: Secondary | ICD-10-CM

## 2017-10-22 DIAGNOSIS — M79671 Pain in right foot: Secondary | ICD-10-CM

## 2017-10-22 DIAGNOSIS — M79675 Pain in left toe(s): Secondary | ICD-10-CM

## 2017-10-22 DIAGNOSIS — E1142 Type 2 diabetes mellitus with diabetic polyneuropathy: Secondary | ICD-10-CM | POA: Diagnosis not present

## 2017-10-22 DIAGNOSIS — B351 Tinea unguium: Secondary | ICD-10-CM | POA: Diagnosis not present

## 2017-10-22 DIAGNOSIS — L989 Disorder of the skin and subcutaneous tissue, unspecified: Secondary | ICD-10-CM | POA: Diagnosis not present

## 2017-10-22 DIAGNOSIS — M79674 Pain in right toe(s): Secondary | ICD-10-CM

## 2017-10-22 DIAGNOSIS — M79672 Pain in left foot: Secondary | ICD-10-CM

## 2017-10-22 NOTE — Progress Notes (Signed)
Subjective: Brittany Rojas is a 72 y.o. female patient with history of diabetes who presents to office today complaining of painful callus and nails; unable to trim. Patient states that the glucose reading yesterday was 354 mg/dl.  Last A1c 5.  Patient states her primary care is Dr. Jacelyn Grip a couple of weeks ago and will be seeing Dr. Suzette Battiest endocrinologist again. Patient is requesting Diabetic shoes.  Patient denies any new changes in medication or new problems.  Patient Active Problem List   Diagnosis Date Noted  . Coronary atherosclerosis of native coronary artery 12/08/2012  . DM (diabetes mellitus) ([place]) 02/28/2012  . Sleep apnea 10/29/2011  . NEUROPATHY 06/14/2008  . Hyperlipidemia 04/29/2007  . EXOGENOUS OBESITY 04/29/2007  . DEPRESSION 04/29/2007  . Essential hypertension 04/29/2007  . PERIPHERAL EDEMA 04/29/2007  . History of cardiovascular disorder 04/29/2007  . DIVERTICULITIS, HX OF 04/29/2007   Current Outpatient Medications on File Prior to Visit  Medication Sig Dispense Refill  . amLODipine (NORVASC) 2.5 MG tablet Take 2.5 mg by mouth daily.    Marland Kitchen aspirin 81 MG tablet Take 81 mg by mouth daily.    Marland Kitchen atorvastatin (LIPITOR) 20 MG tablet Take 20 mg by mouth daily.     . Continuous Blood Gluc Sensor (FREESTYLE LIBRE 14 DAY SENSOR) MISC 1 each by Does not apply route every 14 (fourteen) days. 2 each 5  . gabapentin (NEURONTIN) 300 MG capsule TAKE 3 CAPSULES BY MOUTH AT BEDTIME 270 capsule 3  . insulin NPH Human (HUMULIN N) 100 UNIT/ML injection Inject 0.65 mLs (65 Units total) into the skin at bedtime. 20 mL 3  . insulin regular (HUMULIN R) 100 units/mL injection 3 times a day (just before each meal), 25-30-30 units, and syringes 4/day. 30 mL 11  . losartan-hydrochlorothiazide (HYZAAR) 100-25 MG tablet Take 1 tablet by mouth daily.     . nitroGLYCERIN (NITROSTAT) 0.4 MG SL tablet Place 1 tablet (0.4 mg total) under the tongue every 5 (five) minutes as needed for chest pain. 25 tablet 3   . NON FORMULARY Use CPAP machine each night.    . [DISCONTINUED] telmisartan-hydrochlorothiazide (MICARDIS HCT) 80-12.5 MG per tablet Take 1 tablet by mouth daily.       No current facility-administered medications on file prior to visit.    Allergies  Allergen Reactions  . Erythromycin Other (See Comments)    "upset stomach" (02/17/2012)  . Penicillins Other (See Comments)    ">50 yr ago, I had strept throat; had gotten lots and lots of PCN; last dose caused me to stop breathing" (02/17/2012)  . Tetracycline Other (See Comments)    "upset stomach" (02/17/2012)  . Sulfonamide Derivatives Rash and Other (See Comments)    "upset stomach" (02/17/2012)    No results found for this or any previous visit (from the past 2160 hour(s)).  Objective: General: Patient is awake, alert, and oriented x 3 and in no acute distress.  Integument: Skin is warm, dry and supple bilateral. Nails long and, thickened and  dystrophic with subungual debris, consistent with onychomycosis, 1-5 bilateral. No signs of infection.  Callus right sub-met 2 and left medial bunion.   Remaining integument unremarkable.  Vasculature:  Dorsalis Pedis pulse 1/4 bilateral. Posterior Tibial pulse  1/4 bilateral. Capillary fill time <3 sec 1-5 bilateral.  Scant hair growth to the level of the digits.Temperature gradient within normal limits.  Moderate varicosities present bilateral. No edema present bilateral.   Neurology: The patient has intact sensation measured with a 5.07/10g Semmes  Weinstein Monofilament at all pedal sites bilateral . Vibratory sensation diminished bilateral with tuning fork. No Babinski sign present bilateral.   Musculoskeletal: Bunion and hammertoe with pes planus pedal deformities noted bilateral. Muscular strength 5/5 in all lower extremity muscular groups bilateral without pain on range of motion . No tenderness with calf compression bilateral.  Assessment and Plan: Problem List Items Addressed This Visit     None    Visit Diagnoses    Pain due to onychomycosis of toenails of both feet    -  Primary   Callus of foot       Benign skin lesion       Foot pain, bilateral       Diabetic polyneuropathy associated with type 2 diabetes mellitus ([place])         -Examined patient. -Discussed and educated patient on diabetic foot care, especially with  regards to the vascular, neurological and musculoskeletal systems.  -Mechanically debrided all nails using a sterile nail nipper and debrided callus x2 bilateral using sterile chisel blade and filed with dremel without incident  -[employer] diabetic shoe order form was completed; office to contact primary care for approval / certification;  Office to arrange shoe fitting and dispensing. -Answered all patient questions -Patient to return in 68 days for nail and callus care  -Patient advised to call the office if any problems or questions arise in the meantime.  Landis Martins, DPM

## 2017-11-18 ENCOUNTER — Ambulatory Visit (INDEPENDENT_AMBULATORY_CARE_PROVIDER_SITE_OTHER): Payer: Medicare Other | Admitting: Interventional Cardiology

## 2017-11-18 ENCOUNTER — Encounter: Payer: Self-pay | Admitting: Interventional Cardiology

## 2017-11-18 VITALS — BP 138/70 | HR 64 | Ht 62.0 in | Wt 209.2 lb

## 2017-11-18 DIAGNOSIS — I2510 Atherosclerotic heart disease of native coronary artery without angina pectoris: Secondary | ICD-10-CM | POA: Diagnosis not present

## 2017-11-18 DIAGNOSIS — G4730 Sleep apnea, unspecified: Secondary | ICD-10-CM

## 2017-11-18 DIAGNOSIS — E785 Hyperlipidemia, unspecified: Secondary | ICD-10-CM

## 2017-11-18 DIAGNOSIS — E0829 Diabetes mellitus due to underlying condition with other diabetic kidney complication: Secondary | ICD-10-CM | POA: Diagnosis not present

## 2017-11-18 DIAGNOSIS — I10 Essential (primary) hypertension: Secondary | ICD-10-CM | POA: Diagnosis not present

## 2017-11-18 NOTE — Patient Instructions (Signed)
Medication Instructions:  Your physician recommends that you continue on your current medications as directed. Please refer to the Current Medication list given to you today.  If you need a refill on your cardiac medications before your next appointment, please call your pharmacy.   Lab work: None  If you have labs (blood work) drawn today and your tests are completely normal, you will receive your results only by: Marland Kitchen MyChart Message (if you have MyChart) OR . A paper copy in the mail If you have any lab test that is abnormal or we need to change your treatment, we will call you to review the results.  Testing/Procedures: None  Follow-Up: At [facility], you and your health needs are our priority.  As part of our continuing mission to provide you with exceptional heart care, we have created designated Provider Care Teams.  These Care Teams include your primary Cardiologist (physician) and Advanced Practice Providers (APPs -  Physician Assistants and Nurse Practitioners) who all work together to provide you with the care you need, when you need it. You will need a follow up appointment in 1 year.  Please call our office 2 months in advance to schedule this appointment.  You may see Dr. Tamala Julian or one of the following Advanced Practice Providers on your designated Care Team:   Truitt Merle, NP Cecilie Kicks, NP . Kathyrn Drown, NP  Any Other Special Instructions Will Be Listed Below (If Applicable). Your physician discussed the importance of regular exercise and recommended that you start or continue a regular exercise program for good health.

## 2017-11-18 NOTE — Progress Notes (Signed)
Cardiology Office Note:    Date:  11/18/2017   ID:  Brittany Rojas, DOB Sep 19, 1945, MRN [id number]  PCP:  [name], MD  Cardiologist:  No primary care provider on file.   Referring MD: [name], MD   Chief Complaint  Patient presents with  . Coronary Artery Disease    History of Present Illness:    Brittany Rojas is a 72 y.o. female with a hx of obtuse marginal #1 DES 2014, hypertension, obstructive sleep apnea, and hyperlipidemia.   Has chest discomfort, orthopnea, PND, and angina.  Chronic tendency towards ankle edema.  Compliant with medications.  Working hard to get hemoglobin A1c under 7.  LDL when last checked in August was 108.  She does not smoke.  She denies exertional chest pain.   Past Medical History:  Diagnosis Date  . Bronchitis    "touch q now and then" (02/17/2012)  . Depression   . Diverticulitis   . History of blood transfusion 1945   "when I was 57 days old" (02/17/2012)  . History of transient ischemic attack (TIA) 2001   "possibly; never really pinned down what it was" (02/17/2012)  . Hyperlipidemia   . Hypertension   . Migraines    "outgrew them I guess" (02/17/2012)  . OSA on CPAP   . Pneumonia 12/2010  . Shortness of breath    "occasionally; at any time" (02/17/2012)  . Type II diabetes mellitus ([place])     Past Surgical History:  Procedure Laterality Date  . ABDOMINAL HYSTERECTOMY  2000's  . APPENDECTOMY  04/1988  . CARDIAC CATHETERIZATION  02/13/2012  . COLECTOMY  01/18/1988   "took 3 feet out; result of lots of burst diverticula" (02/17/2012)  . COLECTOMY  04/1988   "9 more inches of colon" (02/17/2012)  . COLOSTOMY  01/18/1988  . COLOSTOMY REVERSAL  04/1988  . CORONARY ANGIOPLASTY WITH STENT PLACEMENT  02/17/2012   "1; first one ever" (02/17/2012)  . LEFT OOPHORECTOMY  1989  . PERCUTANEOUS CORONARY STENT INTERVENTION (PCI-S) N/A 02/17/2012   Procedure: PERCUTANEOUS CORONARY STENT INTERVENTION (PCI-S);  Surgeon: Sinclair Grooms, MD;  Location: [facility]  CATH LAB;  Service: Cardiovascular;  Laterality: N/A;    Current Medications: Current Meds  Medication Sig  . amLODipine (NORVASC) 2.5 MG tablet Take 2.5 mg by mouth daily.  Marland Kitchen aspirin 81 MG tablet Take 81 mg by mouth daily.  Marland Kitchen atorvastatin (LIPITOR) 20 MG tablet Take 20 mg by mouth daily.   . Bimatoprost (LUMIGAN OP) Place 1 drop into both eyes at bedtime.  . COMBIGAN 0.2-0.5 % ophthalmic solution Place 1 drop into both eyes 2 (two) times daily.  . Continuous Blood Gluc Sensor (FREESTYLE LIBRE 14 DAY SENSOR) MISC 1 each by Does not apply route every 14 (fourteen) days.  Marland Kitchen gabapentin (NEURONTIN) 300 MG capsule TAKE 3 CAPSULES BY MOUTH AT BEDTIME  . insulin NPH Human (HUMULIN N,NOVOLIN N) 100 UNIT/ML injection Inject 10 units into the skin each morning and 65 units into the skin each evening.  . insulin regular (NOVOLIN R,HUMULIN R) 100 units/mL injection Use as directed per scale three (3) times daily.  Marland Kitchen losartan-hydrochlorothiazide (HYZAAR) 100-25 MG tablet Take 1 tablet by mouth daily.   . Multiple Vitamins-Minerals (MULTIVITAMIN PO) Take 1 tablet by mouth daily.  . nitroGLYCERIN (NITROSTAT) 0.4 MG SL tablet Place 1 tablet (0.4 mg total) under the tongue every 5 (five) minutes as needed for chest pain.  . NON FORMULARY Use CPAP machine  each night.  Marland Kitchen PROAIR HFA 108 (90 Base) MCG/ACT inhaler Inhale 2 puffs into the lungs every 4 (four) hours as needed for allergies.  Marland Kitchen traMADol (ULTRAM) 50 MG tablet Take one (1) tablet (50 mg) by mouth every eight (8) to twelve (12) hours as needed for pain.     Allergies:   Erythromycin; Penicillins; Tetracycline; and Sulfonamide derivatives   Social History   Socioeconomic History  . Marital status: Married    Spouse name: Not on file  . Number of children: Not on file  . Years of education: Not on file  . Highest education level: Not on file  Occupational History  . Not on file  Social Needs  . Financial resource strain: Not on file  . Food  insecurity:    Worry: Not on file    Inability: Not on file  . Transportation needs:    Medical: Not on file    Non-medical: Not on file  Tobacco Use  . Smoking status: Former Smoker    Years: 2.00    Types: Cigarettes    Last attempt to quit: 11/11/1969    Years since quitting: 48.0  . Smokeless tobacco: Never Used  Substance and Sexual Activity  . Alcohol use: No  . Drug use: No  . Sexual activity: Not Currently  Lifestyle  . Physical activity:    Days per week: Not on file    Minutes per session: Not on file  . Stress: Not on file  Relationships  . Social connections:    Talks on phone: Not on file    Gets together: Not on file    Attends religious service: Not on file    Active member of club or organization: Not on file    Attends meetings of clubs or organizations: Not on file    Relationship status: Not on file  Other Topics Concern  . Not on file  Social History Narrative  . Not on file     Family History: The patient's family history includes Angina in her mother; Diabetes in her mother; Lung cancer in her father; Other in her sister.  ROS:   Please see the history of present illness.    No complaints all other systems reviewed and are negative.  EKGs/Labs/Other Studies Reviewed:    The following studies were reviewed today: LDL August 2019 108, total cholesterol 185 hemoglobin A1c 7.11 Jun 2017, creatinine 1.19 September 2017.  EKG:  EKG is  ordered today.  The ekg ordered today demonstrates normal sinus rhythm small inferior Q waves, vertical axis.  Essentially normal in appearance.  Recent Labs: No results found for requested labs within last 8760 hours.  Recent Lipid Panel No results found for: CHOL, TRIG, HDL, CHOLHDL, VLDL, LDLCALC, LDLDIRECT  Physical Exam:    VS:  BP 138/70   Pulse 64   Ht 5\' 2"  (1.575 m)   Wt 209 lb 3.2 oz (94.9 kg)   BMI 38.26 kg/m     Wt Readings from Last 3 Encounters:  11/18/17 209 lb 3.2 oz (94.9 kg)  07/01/17 219 lb  6.4 oz (99.5 kg)  02/25/17 211 lb 3.2 oz (95.8 kg)     GEN: Obese.  Well nourished, well developed in no acute distress HEENT: Normal NECK: No JVD. LYMPHATICS: No lymphadenopathy CARDIAC: RRR, no murmur, no gallop, trace bilateral edema. VASCULAR: 2+ bilateral radial pulses.  No bruits. RESPIRATORY:  Clear to auscultation without rales, wheezing or rhonchi  ABDOMEN: Soft, non-tender, non-distended, No  pulsatile mass, MUSCULOSKELETAL: No deformity  SKIN: Warm and dry NEUROLOGIC:  Alert and oriented x 3 PSYCHIATRIC:  Normal affect   ASSESSMENT:    1. Atherosclerosis of native coronary artery of native heart without angina pectoris   2. Essential hypertension   3. Hyperlipidemia, unspecified hyperlipidemia type   4. Sleep apnea, unspecified type   5. Diabetes mellitus due to underlying condition with other kidney complication, unspecified whether long term insulin use (HCC)    PLAN:    In order of problems listed above:  1. Stable coronary disease: Secondary prevention should include A1c less than 7, blood pressure 130/80 mmHg or less, 150 minutes of moderate aerobic activity per week, and LDL cholesterol less than 70.  We discussed these and encouragement was given. 2. Low-salt diet and weight loss discussed. 3. I believe statin therapy should be doubled to high intensity level to get LDL below 100 and target 70 mg/dL.  Decrease animal fats in diet. 4. Encourage continued compliance with CPAP. 5. Aerobic activity advocated.  Last A1c was 7.1.  Continue aggressive secondary risk factor modification.  Encourage 150 minutes or greater of moderate aerobic activity per week.  Somewhat limited by lower extremity arthritis left knee greater than right.   Medication Adjustments/Labs and Tests Ordered: Current medicines are reviewed at length with the patient today.  Concerns regarding medicines are outlined above.  Orders Placed This Encounter  Procedures  . EKG 12-Lead   No orders  of the defined types were placed in this encounter.   There are no Patient Instructions on file for this visit.   Signed, Sinclair Grooms, MD  11/18/2017 1:42 PM    [facility]

## 2017-11-24 DIAGNOSIS — I10 Essential (primary) hypertension: Secondary | ICD-10-CM | POA: Diagnosis not present

## 2017-11-24 DIAGNOSIS — I2510 Atherosclerotic heart disease of native coronary artery without angina pectoris: Secondary | ICD-10-CM | POA: Diagnosis not present

## 2017-11-24 DIAGNOSIS — E1165 Type 2 diabetes mellitus with hyperglycemia: Secondary | ICD-10-CM | POA: Diagnosis not present

## 2017-11-24 DIAGNOSIS — E7800 Pure hypercholesterolemia, unspecified: Secondary | ICD-10-CM | POA: Diagnosis not present

## 2017-11-24 DIAGNOSIS — E1140 Type 2 diabetes mellitus with diabetic neuropathy, unspecified: Secondary | ICD-10-CM | POA: Diagnosis not present

## 2017-11-24 DIAGNOSIS — Z23 Encounter for immunization: Secondary | ICD-10-CM | POA: Diagnosis not present

## 2017-11-26 DIAGNOSIS — E559 Vitamin D deficiency, unspecified: Secondary | ICD-10-CM | POA: Diagnosis not present

## 2017-11-26 DIAGNOSIS — E669 Obesity, unspecified: Secondary | ICD-10-CM | POA: Diagnosis not present

## 2017-11-26 DIAGNOSIS — E1165 Type 2 diabetes mellitus with hyperglycemia: Secondary | ICD-10-CM | POA: Diagnosis not present

## 2017-11-26 DIAGNOSIS — E782 Mixed hyperlipidemia: Secondary | ICD-10-CM | POA: Diagnosis not present

## 2017-11-26 DIAGNOSIS — I10 Essential (primary) hypertension: Secondary | ICD-10-CM | POA: Diagnosis not present

## 2017-12-01 DIAGNOSIS — E559 Vitamin D deficiency, unspecified: Secondary | ICD-10-CM | POA: Diagnosis not present

## 2017-12-01 DIAGNOSIS — E669 Obesity, unspecified: Secondary | ICD-10-CM | POA: Diagnosis not present

## 2017-12-01 DIAGNOSIS — E782 Mixed hyperlipidemia: Secondary | ICD-10-CM | POA: Diagnosis not present

## 2017-12-01 DIAGNOSIS — I10 Essential (primary) hypertension: Secondary | ICD-10-CM | POA: Diagnosis not present

## 2017-12-01 DIAGNOSIS — E1165 Type 2 diabetes mellitus with hyperglycemia: Secondary | ICD-10-CM | POA: Diagnosis not present

## 2017-12-01 DIAGNOSIS — M1712 Unilateral primary osteoarthritis, left knee: Secondary | ICD-10-CM | POA: Diagnosis not present

## 2017-12-02 ENCOUNTER — Ambulatory Visit: Payer: Medicare Other | Admitting: Endocrinology

## 2017-12-22 DIAGNOSIS — M1712 Unilateral primary osteoarthritis, left knee: Secondary | ICD-10-CM | POA: Diagnosis not present

## 2017-12-24 ENCOUNTER — Ambulatory Visit (INDEPENDENT_AMBULATORY_CARE_PROVIDER_SITE_OTHER): Payer: Medicare Other | Admitting: Sports Medicine

## 2017-12-24 ENCOUNTER — Encounter: Payer: Self-pay | Admitting: Sports Medicine

## 2017-12-24 VITALS — BP 128/54 | HR 70 | Resp 16

## 2017-12-24 DIAGNOSIS — M79675 Pain in left toe(s): Secondary | ICD-10-CM

## 2017-12-24 DIAGNOSIS — L989 Disorder of the skin and subcutaneous tissue, unspecified: Secondary | ICD-10-CM

## 2017-12-24 DIAGNOSIS — M79671 Pain in right foot: Secondary | ICD-10-CM

## 2017-12-24 DIAGNOSIS — B351 Tinea unguium: Secondary | ICD-10-CM | POA: Diagnosis not present

## 2017-12-24 DIAGNOSIS — E1142 Type 2 diabetes mellitus with diabetic polyneuropathy: Secondary | ICD-10-CM

## 2017-12-24 DIAGNOSIS — L84 Corns and callosities: Secondary | ICD-10-CM | POA: Diagnosis not present

## 2017-12-24 DIAGNOSIS — M79672 Pain in left foot: Secondary | ICD-10-CM

## 2017-12-24 DIAGNOSIS — M79674 Pain in right toe(s): Secondary | ICD-10-CM | POA: Diagnosis not present

## 2017-12-24 NOTE — Progress Notes (Signed)
Subjective: Brittany Rojas is a 72 y.o. female patient with history of diabetes who presents to office today complaining of painful callus and nails; unable to trim. Patient states that the glucose reading today was 340 after getting a cortisone shot in her knee.  Last A1c 6.7.  Patient states her primary care is Dr. Suzette Battiest endocrinologist 3 months ago. Patient is requesting Diabetic shoes.  Patient denies any new changes in medication or new problems.  No other issues noted.  Patient Active Problem List   Diagnosis Date Noted  . Coronary atherosclerosis of native coronary artery 12/08/2012  . DM (diabetes mellitus) ([place]) 02/28/2012  . Sleep apnea 10/29/2011  . NEUROPATHY 06/14/2008  . Hyperlipidemia 04/29/2007  . EXOGENOUS OBESITY 04/29/2007  . DEPRESSION 04/29/2007  . Essential hypertension 04/29/2007  . PERIPHERAL EDEMA 04/29/2007  . History of cardiovascular disorder 04/29/2007  . DIVERTICULITIS, HX OF 04/29/2007   Current Outpatient Medications on File Prior to Visit  Medication Sig Dispense Refill  . amLODipine (NORVASC) 2.5 MG tablet Take 2.5 mg by mouth daily.    Marland Kitchen aspirin 81 MG tablet Take 81 mg by mouth daily.    Marland Kitchen atorvastatin (LIPITOR) 20 MG tablet Take 20 mg by mouth daily.     . Bimatoprost (LUMIGAN OP) Place 1 drop into both eyes at bedtime.    . COMBIGAN 0.2-0.5 % ophthalmic solution Place 1 drop into both eyes 2 (two) times daily.  2  . Continuous Blood Gluc Sensor (FREESTYLE LIBRE 14 DAY SENSOR) MISC 1 each by Does not apply route every 14 (fourteen) days. 2 each 5  . gabapentin (NEURONTIN) 300 MG capsule TAKE 3 CAPSULES BY MOUTH AT BEDTIME 270 capsule 3  . insulin NPH Human (HUMULIN N,NOVOLIN N) 100 UNIT/ML injection Inject 10 units into the skin each morning and 65 units into the skin each evening.    . insulin regular (NOVOLIN R,HUMULIN R) 100 units/mL injection Use as directed per scale three (3) times daily.    Marland Kitchen losartan-hydrochlorothiazide (HYZAAR) 100-25 MG tablet  Take 1 tablet by mouth daily.     . Multiple Vitamins-Minerals (MULTIVITAMIN PO) Take 1 tablet by mouth daily.    . nitroGLYCERIN (NITROSTAT) 0.4 MG SL tablet Place 1 tablet (0.4 mg total) under the tongue every 5 (five) minutes as needed for chest pain. 25 tablet 3  . NON FORMULARY Use CPAP machine each night.    . traMADol (ULTRAM) 50 MG tablet Take one (1) tablet (50 mg) by mouth every eight (8) to twelve (12) hours as needed for pain.  5  . [DISCONTINUED] telmisartan-hydrochlorothiazide (MICARDIS HCT) 80-12.5 MG per tablet Take 1 tablet by mouth daily.       No current facility-administered medications on file prior to visit.    Allergies  Allergen Reactions  . Erythromycin Other (See Comments)    "upset stomach" (02/17/2012)  . Penicillins Other (See Comments)    ">50 yr ago, I had strept throat; had gotten lots and lots of PCN; last dose caused me to stop breathing" (02/17/2012)  . Tetracycline Other (See Comments)    "upset stomach" (02/17/2012)  . Sulfonamide Derivatives Rash and Other (See Comments)    "upset stomach" (02/17/2012)    No results found for this or any previous visit (from the past 2160 hour(s)).  Objective: General: Patient is awake, alert, and oriented x 3 and in no acute distress.  Integument: Skin is warm, dry and supple bilateral. Nails long and, thickened and  dystrophic with subungual debris,  consistent with onychomycosis, 1-5 bilateral. No signs of infection.  Callus right sub-met 2 and left medial bunion.   Remaining integument unremarkable.  Vasculature:  Dorsalis Pedis pulse 1/4 bilateral. Posterior Tibial pulse  1/4 bilateral. Capillary fill time <3 sec 1-5 bilateral.  Scant hair growth to the level of the digits.Temperature gradient within normal limits.  Moderate varicosities present bilateral. No edema present bilateral.   Neurology: The patient has intact sensation measured with a 5.07/10g Semmes Weinstein Monofilament at all pedal sites bilateral .  Vibratory sensation diminished bilateral with tuning fork. No Babinski sign present bilateral.   Musculoskeletal: Bunion and hammertoe with pes planus pedal deformities noted bilateral. Muscular strength 5/5 in all lower extremity muscular groups bilateral without pain on range of motion . No tenderness with calf compression bilateral.  Assessment and Plan: Problem List Items Addressed This Visit    None    Visit Diagnoses    Pain due to onychomycosis of toenails of both feet    -  Primary   Callus of foot       Benign skin lesion       Foot pain, bilateral       Diabetic polyneuropathy associated with type 2 diabetes mellitus ([place])         -Examined patient. -Discussed and educated patient on diabetic foot care, especially with  regards to the vascular, neurological and musculoskeletal systems.  -Mechanically debrided all nails using a sterile nail nipper and debrided callus x2 at right sub-met 2 and left medial bunion using sterile chisel blade and filed with dremel without incident -Patient to return in 74 days for nail and callus care  -Patient advised to call the office if any problems or questions arise in the meantime.  Landis Martins, DPM

## 2018-01-28 DIAGNOSIS — H401131 Primary open-angle glaucoma, bilateral, mild stage: Secondary | ICD-10-CM | POA: Diagnosis not present

## 2018-02-25 ENCOUNTER — Ambulatory Visit (INDEPENDENT_AMBULATORY_CARE_PROVIDER_SITE_OTHER): Payer: Medicare Other | Admitting: Sports Medicine

## 2018-02-25 ENCOUNTER — Encounter: Payer: Self-pay | Admitting: Sports Medicine

## 2018-02-25 VITALS — BP 118/65 | HR 84 | Resp 16

## 2018-02-25 DIAGNOSIS — B351 Tinea unguium: Secondary | ICD-10-CM

## 2018-02-25 DIAGNOSIS — M79675 Pain in left toe(s): Secondary | ICD-10-CM | POA: Diagnosis not present

## 2018-02-25 DIAGNOSIS — L989 Disorder of the skin and subcutaneous tissue, unspecified: Secondary | ICD-10-CM

## 2018-02-25 DIAGNOSIS — M79674 Pain in right toe(s): Secondary | ICD-10-CM

## 2018-02-25 DIAGNOSIS — M79672 Pain in left foot: Secondary | ICD-10-CM

## 2018-02-25 DIAGNOSIS — L84 Corns and callosities: Secondary | ICD-10-CM

## 2018-02-25 DIAGNOSIS — E1142 Type 2 diabetes mellitus with diabetic polyneuropathy: Secondary | ICD-10-CM

## 2018-02-25 DIAGNOSIS — M79671 Pain in right foot: Secondary | ICD-10-CM

## 2018-02-25 DIAGNOSIS — T148XXA Other injury of unspecified body region, initial encounter: Secondary | ICD-10-CM

## 2018-02-25 NOTE — Progress Notes (Signed)
Subjective: Brittany Rojas is a 73 y.o. female patient with history of diabetes who presents to office today complaining of painful callus and nails; unable to trim. Patient states that the glucose reading today was 223.  Last A1c 6.7.  Patient states her primary care is Dr. Suzette Battiest endocrinologist 2 months ago. Patient reports that she has noticed a red area on her right fifth toe that started after wearing a pair of dress shoes.  Patient denies any new changes in medication or new problems.  No other issues noted.  Patient Active Problem List   Diagnosis Date Noted  . Coronary atherosclerosis of native coronary artery 12/08/2012  . DM (diabetes mellitus) ([place]) 02/28/2012  . Sleep apnea 10/29/2011  . NEUROPATHY 06/14/2008  . Hyperlipidemia 04/29/2007  . EXOGENOUS OBESITY 04/29/2007  . DEPRESSION 04/29/2007  . Essential hypertension 04/29/2007  . PERIPHERAL EDEMA 04/29/2007  . History of cardiovascular disorder 04/29/2007  . DIVERTICULITIS, HX OF 04/29/2007   Current Outpatient Medications on File Prior to Visit  Medication Sig Dispense Refill  . amLODipine (NORVASC) 2.5 MG tablet Take 2.5 mg by mouth daily.    Marland Kitchen aspirin 81 MG tablet Take 81 mg by mouth daily.    Marland Kitchen atorvastatin (LIPITOR) 20 MG tablet Take 20 mg by mouth daily.     . Bimatoprost (LUMIGAN OP) Place 1 drop into both eyes at bedtime.    . COMBIGAN 0.2-0.5 % ophthalmic solution Place 1 drop into both eyes 2 (two) times daily.  2  . Continuous Blood Gluc Sensor (FREESTYLE LIBRE 14 DAY SENSOR) MISC 1 each by Does not apply route every 14 (fourteen) days. 2 each 5  . gabapentin (NEURONTIN) 300 MG capsule TAKE 3 CAPSULES BY MOUTH AT BEDTIME 270 capsule 3  . insulin NPH Human (HUMULIN N,NOVOLIN N) 100 UNIT/ML injection Inject 10 units into the skin each morning and 65 units into the skin each evening.    . insulin regular (NOVOLIN R,HUMULIN R) 100 units/mL injection Use as directed per scale three (3) times daily.    Marland Kitchen  losartan-hydrochlorothiazide (HYZAAR) 100-25 MG tablet Take 1 tablet by mouth daily.     . Multiple Vitamins-Minerals (MULTIVITAMIN PO) Take 1 tablet by mouth daily.    . nitroGLYCERIN (NITROSTAT) 0.4 MG SL tablet Place 1 tablet (0.4 mg total) under the tongue every 5 (five) minutes as needed for chest pain. 25 tablet 3  . NON FORMULARY Use CPAP machine each night.    . traMADol (ULTRAM) 50 MG tablet Take one (1) tablet (50 mg) by mouth every eight (8) to twelve (12) hours as needed for pain.  5  . [DISCONTINUED] telmisartan-hydrochlorothiazide (MICARDIS HCT) 80-12.5 MG per tablet Take 1 tablet by mouth daily.       No current facility-administered medications on file prior to visit.    Allergies  Allergen Reactions  . Erythromycin Other (See Comments)    "upset stomach" (02/17/2012)  . Penicillins Other (See Comments)    ">50 yr ago, I had strept throat; had gotten lots and lots of PCN; last dose caused me to stop breathing" (02/17/2012)  . Tetracycline Other (See Comments)    "upset stomach" (02/17/2012)  . Sulfonamide Derivatives Rash and Other (See Comments)    "upset stomach" (02/17/2012)    No results found for this or any previous visit (from the past 2160 hour(s)).  Objective: General: Patient is awake, alert, and oriented x 3 and in no acute distress.  Integument: Skin is warm, dry and supple bilateral.  Nails long and, thickened and  dystrophic with subungual debris, consistent with onychomycosis, 1-5 bilateral. No signs of infection.  Callus right sub-met 2 and left medial bunion.  Abrasion to right fifth toe with no surrounding signs of infection.  Remaining integument unremarkable.  Vasculature:  Dorsalis Pedis pulse 1/4 bilateral. Posterior Tibial pulse  1/4 bilateral. Capillary fill time <3 sec 1-5 bilateral.  Scant hair growth to the level of the digits.Temperature gradient within normal limits.  Moderate varicosities present bilateral. No edema present bilateral.   Neurology:  The patient has intact sensation measured with a 5.07/10g Semmes Weinstein Monofilament at all pedal sites bilateral . Vibratory sensation diminished bilateral with tuning fork. No Babinski sign present bilateral.   Musculoskeletal: Bunion and hammertoe with pes planus pedal deformities noted bilateral. Muscular strength 5/5 in all lower extremity muscular groups bilateral without pain on range of motion . No tenderness with calf compression bilateral.  Assessment and Plan: Problem List Items Addressed This Visit    None    Visit Diagnoses    Pain due to onychomycosis of toenails of both feet    -  Primary   Callus of foot       Benign skin lesion       Foot pain, bilateral       Diabetic polyneuropathy associated with type 2 diabetes mellitus ([place])       Abrasion         -Examined patient. -Discussed and educated patient on diabetic foot care, especially with  regards to the vascular, neurological and musculoskeletal systems.  -Mechanically debrided all nails using a sterile nail nipper and debrided callus x2 at right sub-met 2 and left medial bunion using sterile chisel blade and filed with dremel without incident -Advised patient to refrain from wearing ill fitting shoes and if abrasion worsens to apply Neosporin and return to office for reevaluation -Patient to return in 55 to 63 days for nail and callus care  -Patient advised to call the office if any problems or questions arise in the meantime.  Landis Martins, DPM

## 2018-03-09 DIAGNOSIS — H01114 Allergic dermatitis of left upper eyelid: Secondary | ICD-10-CM | POA: Diagnosis not present

## 2018-03-26 DIAGNOSIS — M1712 Unilateral primary osteoarthritis, left knee: Secondary | ICD-10-CM | POA: Diagnosis not present

## 2018-04-01 DIAGNOSIS — H401131 Primary open-angle glaucoma, bilateral, mild stage: Secondary | ICD-10-CM | POA: Diagnosis not present

## 2018-04-07 DIAGNOSIS — H6122 Impacted cerumen, left ear: Secondary | ICD-10-CM | POA: Diagnosis not present

## 2018-04-07 DIAGNOSIS — E782 Mixed hyperlipidemia: Secondary | ICD-10-CM | POA: Diagnosis not present

## 2018-04-07 DIAGNOSIS — E559 Vitamin D deficiency, unspecified: Secondary | ICD-10-CM | POA: Diagnosis not present

## 2018-04-07 DIAGNOSIS — E669 Obesity, unspecified: Secondary | ICD-10-CM | POA: Diagnosis not present

## 2018-04-07 DIAGNOSIS — M1712 Unilateral primary osteoarthritis, left knee: Secondary | ICD-10-CM | POA: Diagnosis not present

## 2018-04-07 DIAGNOSIS — E1165 Type 2 diabetes mellitus with hyperglycemia: Secondary | ICD-10-CM | POA: Diagnosis not present

## 2018-04-07 DIAGNOSIS — I10 Essential (primary) hypertension: Secondary | ICD-10-CM | POA: Diagnosis not present

## 2018-04-28 DIAGNOSIS — G4733 Obstructive sleep apnea (adult) (pediatric): Secondary | ICD-10-CM | POA: Diagnosis not present

## 2018-05-01 ENCOUNTER — Other Ambulatory Visit: Payer: Self-pay

## 2018-05-01 ENCOUNTER — Encounter: Payer: Self-pay | Admitting: Sports Medicine

## 2018-05-01 ENCOUNTER — Ambulatory Visit (INDEPENDENT_AMBULATORY_CARE_PROVIDER_SITE_OTHER): Payer: Medicare Other | Admitting: Sports Medicine

## 2018-05-01 VITALS — BP 138/50 | HR 60 | Temp 97.9°F | Resp 16

## 2018-05-01 DIAGNOSIS — L989 Disorder of the skin and subcutaneous tissue, unspecified: Secondary | ICD-10-CM | POA: Diagnosis not present

## 2018-05-01 DIAGNOSIS — M79675 Pain in left toe(s): Secondary | ICD-10-CM | POA: Diagnosis not present

## 2018-05-01 DIAGNOSIS — L84 Corns and callosities: Secondary | ICD-10-CM

## 2018-05-01 DIAGNOSIS — E1142 Type 2 diabetes mellitus with diabetic polyneuropathy: Secondary | ICD-10-CM | POA: Diagnosis not present

## 2018-05-01 DIAGNOSIS — B351 Tinea unguium: Secondary | ICD-10-CM | POA: Diagnosis not present

## 2018-05-01 DIAGNOSIS — M79674 Pain in right toe(s): Secondary | ICD-10-CM | POA: Diagnosis not present

## 2018-05-01 DIAGNOSIS — M79672 Pain in left foot: Secondary | ICD-10-CM | POA: Diagnosis not present

## 2018-05-01 DIAGNOSIS — M79671 Pain in right foot: Secondary | ICD-10-CM | POA: Diagnosis not present

## 2018-05-01 NOTE — Progress Notes (Signed)
Subjective: Brittany Rojas is a 73 y.o. female patient with history of diabetes who presents to office today complaining of painful callus and nails; unable to trim. Patient states that the glucose reading today was 210.  Last A1c 6.7.  Patient states her primary care is Dr. Suzette Battiest endocrinologist 3-4 months ago.  Patient denies any new changes in medication or new problems.  No other issues noted.  Patient Active Problem List   Diagnosis Date Noted  . Coronary atherosclerosis of native coronary artery 12/08/2012  . DM (diabetes mellitus) ([place]) 02/28/2012  . Sleep apnea 10/29/2011  . NEUROPATHY 06/14/2008  . Hyperlipidemia 04/29/2007  . EXOGENOUS OBESITY 04/29/2007  . DEPRESSION 04/29/2007  . Essential hypertension 04/29/2007  . PERIPHERAL EDEMA 04/29/2007  . History of cardiovascular disorder 04/29/2007  . DIVERTICULITIS, HX OF 04/29/2007   Current Outpatient Medications on File Prior to Visit  Medication Sig Dispense Refill  . amLODipine (NORVASC) 2.5 MG tablet Take 2.5 mg by mouth daily.    Marland Kitchen aspirin 81 MG tablet Take 81 mg by mouth daily.    Marland Kitchen atorvastatin (LIPITOR) 20 MG tablet Take 20 mg by mouth daily.     Marland Kitchen atorvastatin (LIPITOR) 40 MG tablet     . Bimatoprost (LUMIGAN OP) Place 1 drop into both eyes at bedtime.    . COMBIGAN 0.2-0.5 % ophthalmic solution Place 1 drop into both eyes 2 (two) times daily.  2  . Continuous Blood Gluc Sensor (FREESTYLE LIBRE 14 DAY SENSOR) MISC 1 each by Does not apply route every 14 (fourteen) days. 2 each 5  . diclofenac sodium (VOLTAREN) 1 % GEL APPLY 2 GRAMS TO AREA UP TO 4 TIMES DAILY    . gabapentin (NEURONTIN) 300 MG capsule TAKE 3 CAPSULES BY MOUTH AT BEDTIME 270 capsule 3  . insulin NPH Human (HUMULIN N,NOVOLIN N) 100 UNIT/ML injection Inject 10 units into the skin each morning and 65 units into the skin each evening.    . insulin regular (NOVOLIN R,HUMULIN R) 100 units/mL injection Use as directed per scale three (3) times daily.    Marland Kitchen  losartan-hydrochlorothiazide (HYZAAR) 100-25 MG tablet Take 1 tablet by mouth daily.     . Multiple Vitamins-Minerals (MULTIVITAMIN PO) Take 1 tablet by mouth daily.    . nitroGLYCERIN (NITROSTAT) 0.4 MG SL tablet Place 1 tablet (0.4 mg total) under the tongue every 5 (five) minutes as needed for chest pain. 25 tablet 3  . NON FORMULARY Use CPAP machine each night.    . traMADol (ULTRAM) 50 MG tablet Take one (1) tablet (50 mg) by mouth every eight (8) to twelve (12) hours as needed for pain.  5  . [DISCONTINUED] telmisartan-hydrochlorothiazide (MICARDIS HCT) 80-12.5 MG per tablet Take 1 tablet by mouth daily.       No current facility-administered medications on file prior to visit.    Allergies  Allergen Reactions  . Erythromycin Other (See Comments)    "upset stomach" (02/17/2012)  . Penicillins Other (See Comments)    ">50 yr ago, I had strept throat; had gotten lots and lots of PCN; last dose caused me to stop breathing" (02/17/2012)  . Tetracycline Other (See Comments)    "upset stomach" (02/17/2012)  . Sulfonamide Derivatives Rash and Other (See Comments)    "upset stomach" (02/17/2012)    No results found for this or any previous visit (from the past 2160 hour(s)).  Objective: General: Patient is awake, alert, and oriented x 3 and in no acute distress.  Integument:  Skin is warm, dry and supple bilateral. Nails long and, thickened and  dystrophic with subungual debris, consistent with onychomycosis, 1-5 bilateral. No signs of infection.  Callus right sub-met 2 and left medial bunion.  Abrasion to right fifth toe with no surrounding signs of infection.  Remaining integument unremarkable.  Vasculature:  Dorsalis Pedis pulse 1/4 bilateral. Posterior Tibial pulse  1/4 bilateral. Capillary fill time <3 sec 1-5 bilateral.  Scant hair growth to the level of the digits.Temperature gradient within normal limits.  Moderate varicosities present bilateral. No edema present bilateral.   Neurology:  The patient has intact sensation measured with a 5.07/10g Semmes Weinstein Monofilament at all pedal sites bilateral . Vibratory sensation diminished bilateral with tuning fork. No Babinski sign present bilateral.   Musculoskeletal: Bunion and hammertoe with pes planus pedal deformities noted bilateral. Muscular strength 5/5 in all lower extremity muscular groups bilateral without pain on range of motion . No tenderness with calf compression bilateral.  Assessment and Plan: Problem List Items Addressed This Visit    None    Visit Diagnoses    Pain due to onychomycosis of toenails of both feet    -  Primary   Callus of foot       Benign skin lesion       Foot pain, bilateral       Diabetic polyneuropathy associated with type 2 diabetes mellitus ([facility])       Relevant Medications   atorvastatin (LIPITOR) 40 MG tablet     -Examined patient. -Discussed and educated patient on diabetic foot care, especially with  regards to the vascular, neurological and musculoskeletal systems.  -Mechanically debrided all nails using a sterile nail nipper and debrided callus x2 at right sub-met 2 and left medial bunion using sterile chisel blade and filed with dremel without incident -Patient to return in  20 days for nail and callus care  -Patient advised to call the office if any problems or questions arise in the meantime.  Landis Martins, DPM

## 2018-05-26 DIAGNOSIS — E1140 Type 2 diabetes mellitus with diabetic neuropathy, unspecified: Secondary | ICD-10-CM | POA: Diagnosis not present

## 2018-05-26 DIAGNOSIS — E1165 Type 2 diabetes mellitus with hyperglycemia: Secondary | ICD-10-CM | POA: Diagnosis not present

## 2018-05-26 DIAGNOSIS — I2510 Atherosclerotic heart disease of native coronary artery without angina pectoris: Secondary | ICD-10-CM | POA: Diagnosis not present

## 2018-05-26 DIAGNOSIS — I10 Essential (primary) hypertension: Secondary | ICD-10-CM | POA: Diagnosis not present

## 2018-05-26 DIAGNOSIS — E7800 Pure hypercholesterolemia, unspecified: Secondary | ICD-10-CM | POA: Diagnosis not present

## 2018-06-07 DIAGNOSIS — E782 Mixed hyperlipidemia: Secondary | ICD-10-CM | POA: Diagnosis not present

## 2018-06-07 DIAGNOSIS — E1165 Type 2 diabetes mellitus with hyperglycemia: Secondary | ICD-10-CM | POA: Diagnosis not present

## 2018-06-07 DIAGNOSIS — M1712 Unilateral primary osteoarthritis, left knee: Secondary | ICD-10-CM | POA: Diagnosis not present

## 2018-06-07 DIAGNOSIS — I10 Essential (primary) hypertension: Secondary | ICD-10-CM | POA: Diagnosis not present

## 2018-06-07 DIAGNOSIS — E119 Type 2 diabetes mellitus without complications: Secondary | ICD-10-CM | POA: Diagnosis not present

## 2018-06-07 DIAGNOSIS — I2510 Atherosclerotic heart disease of native coronary artery without angina pectoris: Secondary | ICD-10-CM | POA: Diagnosis not present

## 2018-06-22 DIAGNOSIS — E782 Mixed hyperlipidemia: Secondary | ICD-10-CM | POA: Diagnosis not present

## 2018-06-22 DIAGNOSIS — I2510 Atherosclerotic heart disease of native coronary artery without angina pectoris: Secondary | ICD-10-CM | POA: Diagnosis not present

## 2018-06-22 DIAGNOSIS — M1712 Unilateral primary osteoarthritis, left knee: Secondary | ICD-10-CM | POA: Diagnosis not present

## 2018-06-22 DIAGNOSIS — E119 Type 2 diabetes mellitus without complications: Secondary | ICD-10-CM | POA: Diagnosis not present

## 2018-06-22 DIAGNOSIS — I10 Essential (primary) hypertension: Secondary | ICD-10-CM | POA: Diagnosis not present

## 2018-06-22 DIAGNOSIS — E1165 Type 2 diabetes mellitus with hyperglycemia: Secondary | ICD-10-CM | POA: Diagnosis not present

## 2018-07-03 ENCOUNTER — Ambulatory Visit: Payer: Medicare Other | Admitting: Sports Medicine

## 2018-07-15 DIAGNOSIS — W548XXA Other contact with dog, initial encounter: Secondary | ICD-10-CM | POA: Diagnosis not present

## 2018-07-15 DIAGNOSIS — S60512A Abrasion of left hand, initial encounter: Secondary | ICD-10-CM | POA: Diagnosis not present

## 2018-07-16 ENCOUNTER — Other Ambulatory Visit: Payer: Self-pay

## 2018-07-16 ENCOUNTER — Encounter: Payer: Self-pay | Admitting: Sports Medicine

## 2018-07-16 ENCOUNTER — Ambulatory Visit (INDEPENDENT_AMBULATORY_CARE_PROVIDER_SITE_OTHER): Payer: Medicare Other | Admitting: Sports Medicine

## 2018-07-16 DIAGNOSIS — M79675 Pain in left toe(s): Secondary | ICD-10-CM | POA: Diagnosis not present

## 2018-07-16 DIAGNOSIS — M79674 Pain in right toe(s): Secondary | ICD-10-CM | POA: Diagnosis not present

## 2018-07-16 DIAGNOSIS — M79672 Pain in left foot: Secondary | ICD-10-CM

## 2018-07-16 DIAGNOSIS — L84 Corns and callosities: Secondary | ICD-10-CM | POA: Diagnosis not present

## 2018-07-16 DIAGNOSIS — E1142 Type 2 diabetes mellitus with diabetic polyneuropathy: Secondary | ICD-10-CM | POA: Diagnosis not present

## 2018-07-16 DIAGNOSIS — B351 Tinea unguium: Secondary | ICD-10-CM | POA: Diagnosis not present

## 2018-07-16 DIAGNOSIS — M79671 Pain in right foot: Secondary | ICD-10-CM | POA: Diagnosis not present

## 2018-07-16 NOTE — Progress Notes (Signed)
Subjective: Brittany Rojas is a 73 y.o. female patient with history of diabetes who presents to office today complaining of painful callus and nails; unable to trim. Patient states that the glucose reading today was 200.  Last A1c 6.7.  Patient admits that she was scratched by a dog and had to get a tetanus shot last week.  Patient denies any other issues at this time.  Patient Active Problem List   Diagnosis Date Noted  . Coronary atherosclerosis of native coronary artery 12/08/2012  . DM (diabetes mellitus) ([place]) 02/28/2012  . Sleep apnea 10/29/2011  . NEUROPATHY 06/14/2008  . Hyperlipidemia 04/29/2007  . EXOGENOUS OBESITY 04/29/2007  . DEPRESSION 04/29/2007  . Essential hypertension 04/29/2007  . PERIPHERAL EDEMA 04/29/2007  . History of cardiovascular disorder 04/29/2007  . DIVERTICULITIS, HX OF 04/29/2007   Current Outpatient Medications on File Prior to Visit  Medication Sig Dispense Refill  . amLODipine (NORVASC) 2.5 MG tablet Take 2.5 mg by mouth daily.    Marland Kitchen aspirin 81 MG tablet Take 81 mg by mouth daily.    Marland Kitchen atorvastatin (LIPITOR) 20 MG tablet Take 20 mg by mouth daily.     Marland Kitchen atorvastatin (LIPITOR) 40 MG tablet     . Bimatoprost (LUMIGAN OP) Place 1 drop into both eyes at bedtime.    . COMBIGAN 0.2-0.5 % ophthalmic solution Place 1 drop into both eyes 2 (two) times daily.  2  . Continuous Blood Gluc Sensor (FREESTYLE LIBRE 14 DAY SENSOR) MISC 1 each by Does not apply route every 14 (fourteen) days. 2 each 5  . diclofenac sodium (VOLTAREN) 1 % GEL APPLY 2 GRAMS TO AREA UP TO 4 TIMES DAILY    . gabapentin (NEURONTIN) 300 MG capsule TAKE 3 CAPSULES BY MOUTH AT BEDTIME 270 capsule 3  . insulin NPH Human (HUMULIN N,NOVOLIN N) 100 UNIT/ML injection Inject 10 units into the skin each morning and 65 units into the skin each evening.    . insulin regular (NOVOLIN R,HUMULIN R) 100 units/mL injection Use as directed per scale three (3) times daily.    Marland Kitchen losartan-hydrochlorothiazide  (HYZAAR) 100-25 MG tablet Take 1 tablet by mouth daily.     . Multiple Vitamins-Minerals (MULTIVITAMIN PO) Take 1 tablet by mouth daily.    . nitroGLYCERIN (NITROSTAT) 0.4 MG SL tablet Place 1 tablet (0.4 mg total) under the tongue every 5 (five) minutes as needed for chest pain. 25 tablet 3  . NON FORMULARY Use CPAP machine each night.    . traMADol (ULTRAM) 50 MG tablet Take one (1) tablet (50 mg) by mouth every eight (8) to twelve (12) hours as needed for pain.  5  . [DISCONTINUED] telmisartan-hydrochlorothiazide (MICARDIS HCT) 80-12.5 MG per tablet Take 1 tablet by mouth daily.       No current facility-administered medications on file prior to visit.    Allergies  Allergen Reactions  . Erythromycin Other (See Comments)    "upset stomach" (02/17/2012)  . Penicillins Other (See Comments)    ">50 yr ago, I had strept throat; had gotten lots and lots of PCN; last dose caused me to stop breathing" (02/17/2012)  . Tetracycline Other (See Comments)    "upset stomach" (02/17/2012)  . Sulfonamide Derivatives Rash and Other (See Comments)    "upset stomach" (02/17/2012)    No results found for this or any previous visit (from the past 2160 hour(s)).  Objective: General: Patient is awake, alert, and oriented x 3 and in no acute distress.  Integument: Skin  is warm, dry and supple bilateral. Nails long and, thickened and  dystrophic with subungual debris, consistent with onychomycosis, 1-5 bilateral. No signs of infection.  Callus right sub-met 2 and left medial bunion.  Abrasion to right fifth toe with no surrounding signs of infection.  Remaining integument unremarkable.  Vasculature:  Dorsalis Pedis pulse 1/4 bilateral. Posterior Tibial pulse  1/4 bilateral. Capillary fill time <3 sec 1-5 bilateral.  Scant hair growth to the level of the digits.Temperature gradient within normal limits.  Moderate varicosities present bilateral. No edema present bilateral.   Neurology: The patient has intact  sensation measured with a 5.07/10g Semmes Weinstein Monofilament at all pedal sites bilateral . Vibratory sensation diminished bilateral with tuning fork. No Babinski sign present bilateral.   Musculoskeletal: Bunion and hammertoe with pes planus pedal deformities noted bilateral. Muscular strength 5/5 in all lower extremity muscular groups bilateral without pain on range of motion . No tenderness with calf compression bilateral.  Assessment and Plan: Problem List Items Addressed This Visit    None    Visit Diagnoses    Pain due to onychomycosis of toenails of both feet    -  Primary   Callus of foot       Foot pain, bilateral       Diabetic polyneuropathy associated with type 2 diabetes mellitus ([place])         -Examined patient. -Discussed and educated patient on diabetic foot care, especially with  regards to the vascular, neurological and musculoskeletal systems.  -Mechanically debrided all nails using a sterile nail nipper and debrided callus x2 at right sub-met 2 and left medial bunion using sterile chisel blade and filed with dremel without incident -Recommend good supportive shoe for foot type -Patient to return in  63 days for nail and callus care  -Patient advised to call the office if any problems or questions arise in the meantime.  Landis Martins, DPM

## 2018-07-30 DIAGNOSIS — M1712 Unilateral primary osteoarthritis, left knee: Secondary | ICD-10-CM | POA: Diagnosis not present

## 2018-08-03 DIAGNOSIS — I10 Essential (primary) hypertension: Secondary | ICD-10-CM | POA: Diagnosis not present

## 2018-08-03 DIAGNOSIS — E1165 Type 2 diabetes mellitus with hyperglycemia: Secondary | ICD-10-CM | POA: Diagnosis not present

## 2018-08-03 DIAGNOSIS — E782 Mixed hyperlipidemia: Secondary | ICD-10-CM | POA: Diagnosis not present

## 2018-08-03 DIAGNOSIS — I2510 Atherosclerotic heart disease of native coronary artery without angina pectoris: Secondary | ICD-10-CM | POA: Diagnosis not present

## 2018-08-03 DIAGNOSIS — M1712 Unilateral primary osteoarthritis, left knee: Secondary | ICD-10-CM | POA: Diagnosis not present

## 2018-08-03 DIAGNOSIS — E119 Type 2 diabetes mellitus without complications: Secondary | ICD-10-CM | POA: Diagnosis not present

## 2018-08-10 DIAGNOSIS — H401131 Primary open-angle glaucoma, bilateral, mild stage: Secondary | ICD-10-CM | POA: Diagnosis not present

## 2018-08-10 DIAGNOSIS — E119 Type 2 diabetes mellitus without complications: Secondary | ICD-10-CM | POA: Diagnosis not present

## 2018-08-25 DIAGNOSIS — I10 Essential (primary) hypertension: Secondary | ICD-10-CM | POA: Diagnosis not present

## 2018-08-25 DIAGNOSIS — E1140 Type 2 diabetes mellitus with diabetic neuropathy, unspecified: Secondary | ICD-10-CM | POA: Diagnosis not present

## 2018-08-25 DIAGNOSIS — E1165 Type 2 diabetes mellitus with hyperglycemia: Secondary | ICD-10-CM | POA: Diagnosis not present

## 2018-08-25 DIAGNOSIS — E7800 Pure hypercholesterolemia, unspecified: Secondary | ICD-10-CM | POA: Diagnosis not present

## 2018-08-25 DIAGNOSIS — I2510 Atherosclerotic heart disease of native coronary artery without angina pectoris: Secondary | ICD-10-CM | POA: Diagnosis not present

## 2018-09-11 DIAGNOSIS — I2510 Atherosclerotic heart disease of native coronary artery without angina pectoris: Secondary | ICD-10-CM | POA: Diagnosis not present

## 2018-09-11 DIAGNOSIS — M1712 Unilateral primary osteoarthritis, left knee: Secondary | ICD-10-CM | POA: Diagnosis not present

## 2018-09-11 DIAGNOSIS — E119 Type 2 diabetes mellitus without complications: Secondary | ICD-10-CM | POA: Diagnosis not present

## 2018-09-11 DIAGNOSIS — E782 Mixed hyperlipidemia: Secondary | ICD-10-CM | POA: Diagnosis not present

## 2018-09-11 DIAGNOSIS — I10 Essential (primary) hypertension: Secondary | ICD-10-CM | POA: Diagnosis not present

## 2018-09-11 DIAGNOSIS — E1165 Type 2 diabetes mellitus with hyperglycemia: Secondary | ICD-10-CM | POA: Diagnosis not present

## 2018-09-15 DIAGNOSIS — E1140 Type 2 diabetes mellitus with diabetic neuropathy, unspecified: Secondary | ICD-10-CM | POA: Diagnosis not present

## 2018-09-15 DIAGNOSIS — I10 Essential (primary) hypertension: Secondary | ICD-10-CM | POA: Diagnosis not present

## 2018-09-15 DIAGNOSIS — E1165 Type 2 diabetes mellitus with hyperglycemia: Secondary | ICD-10-CM | POA: Diagnosis not present

## 2018-09-18 ENCOUNTER — Encounter: Payer: Self-pay | Admitting: Sports Medicine

## 2018-09-18 ENCOUNTER — Other Ambulatory Visit: Payer: Self-pay

## 2018-09-18 ENCOUNTER — Ambulatory Visit (INDEPENDENT_AMBULATORY_CARE_PROVIDER_SITE_OTHER): Payer: Medicare Other | Admitting: Sports Medicine

## 2018-09-18 VITALS — Temp 97.4°F | Resp 16

## 2018-09-18 DIAGNOSIS — M79674 Pain in right toe(s): Secondary | ICD-10-CM

## 2018-09-18 DIAGNOSIS — M79671 Pain in right foot: Secondary | ICD-10-CM | POA: Diagnosis not present

## 2018-09-18 DIAGNOSIS — E1142 Type 2 diabetes mellitus with diabetic polyneuropathy: Secondary | ICD-10-CM | POA: Diagnosis not present

## 2018-09-18 DIAGNOSIS — M79675 Pain in left toe(s): Secondary | ICD-10-CM | POA: Diagnosis not present

## 2018-09-18 DIAGNOSIS — M79672 Pain in left foot: Secondary | ICD-10-CM

## 2018-09-18 DIAGNOSIS — L84 Corns and callosities: Secondary | ICD-10-CM

## 2018-09-18 DIAGNOSIS — B351 Tinea unguium: Secondary | ICD-10-CM

## 2018-09-18 NOTE — Progress Notes (Signed)
Subjective: Brittany Rojas is a 73 y.o. female patient with history of diabetes who presents to office today complaining of painful callus and nails; unable to trim. Patient states that the glucose reading today was 157.  Last A1c under 7.  Patient denies any other issues at this time.  Patient Active Problem List   Diagnosis Date Noted  . Coronary atherosclerosis of native coronary artery 12/08/2012  . DM (diabetes mellitus) ([place]) 02/28/2012  . Sleep apnea 10/29/2011  . NEUROPATHY 06/14/2008  . Hyperlipidemia 04/29/2007  . EXOGENOUS OBESITY 04/29/2007  . DEPRESSION 04/29/2007  . Essential hypertension 04/29/2007  . PERIPHERAL EDEMA 04/29/2007  . History of cardiovascular disorder 04/29/2007  . DIVERTICULITIS, HX OF 04/29/2007   Current Outpatient Medications on File Prior to Visit  Medication Sig Dispense Refill  . amLODipine (NORVASC) 2.5 MG tablet Take 2.5 mg by mouth daily.    Marland Kitchen aspirin 81 MG tablet Take 81 mg by mouth daily.    Marland Kitchen atorvastatin (LIPITOR) 20 MG tablet Take 20 mg by mouth daily.     Marland Kitchen atorvastatin (LIPITOR) 40 MG tablet     . Bimatoprost (LUMIGAN OP) Place 1 drop into both eyes at bedtime.    . COMBIGAN 0.2-0.5 % ophthalmic solution Place 1 drop into both eyes 2 (two) times daily.  2  . Continuous Blood Gluc Sensor (FREESTYLE LIBRE 14 DAY SENSOR) MISC 1 each by Does not apply route every 14 (fourteen) days. 2 each 5  . diclofenac sodium (VOLTAREN) 1 % GEL APPLY 2 GRAMS TO AREA UP TO 4 TIMES DAILY    . gabapentin (NEURONTIN) 300 MG capsule TAKE 3 CAPSULES BY MOUTH AT BEDTIME 270 capsule 3  . insulin NPH Human (HUMULIN N,NOVOLIN N) 100 UNIT/ML injection Inject 10 units into the skin each morning and 65 units into the skin each evening.    . insulin regular (NOVOLIN R,HUMULIN R) 100 units/mL injection Use as directed per scale three (3) times daily.    Marland Kitchen losartan-hydrochlorothiazide (HYZAAR) 100-25 MG tablet Take 1 tablet by mouth daily.     . Multiple Vitamins-Minerals  (MULTIVITAMIN PO) Take 1 tablet by mouth daily.    . nitroGLYCERIN (NITROSTAT) 0.4 MG SL tablet Place 1 tablet (0.4 mg total) under the tongue every 5 (five) minutes as needed for chest pain. 25 tablet 3  . NON FORMULARY Use CPAP machine each night.    . traMADol (ULTRAM) 50 MG tablet Take one (1) tablet (50 mg) by mouth every eight (8) to twelve (12) hours as needed for pain.  5  . [DISCONTINUED] telmisartan-hydrochlorothiazide (MICARDIS HCT) 80-12.5 MG per tablet Take 1 tablet by mouth daily.       No current facility-administered medications on file prior to visit.    Allergies  Allergen Reactions  . Erythromycin Other (See Comments)    "upset stomach" (02/17/2012)  . Penicillins Other (See Comments)    ">50 yr ago, I had strept throat; had gotten lots and lots of PCN; last dose caused me to stop breathing" (02/17/2012)  . Tetracycline Other (See Comments)    "upset stomach" (02/17/2012)  . Sulfonamide Derivatives Rash and Other (See Comments)    "upset stomach" (02/17/2012)    No results found for this or any previous visit (from the past 2160 hour(s)).  Objective: General: Patient is awake, alert, and oriented x 3 and in no acute distress.  Integument: Skin is warm, dry and supple bilateral. Nails long and, thickened and  dystrophic with subungual debris, consistent with  onychomycosis, 1-5 bilateral. No signs of infection.  Callus right sub-met 2 and left medial bunion.  Abrasion to right fifth toe with no surrounding signs of infection.  Remaining integument unremarkable.  Vasculature:  Dorsalis Pedis pulse 1/4 bilateral. Posterior Tibial pulse  1/4 bilateral. Capillary fill time <3 sec 1-5 bilateral.  Scant hair growth to the level of the digits.Temperature gradient within normal limits.  Moderate varicosities present bilateral. No edema present bilateral.   Neurology: The patient has intact sensation measured with a 5.07/10g Semmes Weinstein Monofilament at all pedal sites bilateral .  Vibratory sensation diminished bilateral with tuning fork. No Babinski sign present bilateral.   Musculoskeletal: Bunion and hammertoe with pes planus pedal deformities noted bilateral. Muscular strength 5/5 in all lower extremity muscular groups bilateral without pain on range of motion . No tenderness with calf compression bilateral.  Assessment and Plan: Problem List Items Addressed This Visit    None    Visit Diagnoses    Pain due to onychomycosis of toenails of both feet    -  Primary   Callus of foot       Foot pain, bilateral       Diabetic polyneuropathy associated with type 2 diabetes mellitus ([place])         -Examined patient. -Discussed and educated patient on diabetic foot care, especially with  regards to the vascular, neurological and musculoskeletal systems.  -Mechanically debrided all nails using a sterile nail nipper and debrided callus x2 at right sub-met 2 and left medial bunion using sterile chisel blade and filed with dremel without incident -Patient to return in  30 days for nail and callus care  -Patient advised to call the office if any problems or questions arise in the meantime.  Landis Martins, DPM

## 2018-10-08 DIAGNOSIS — I10 Essential (primary) hypertension: Secondary | ICD-10-CM | POA: Diagnosis not present

## 2018-10-08 DIAGNOSIS — E1165 Type 2 diabetes mellitus with hyperglycemia: Secondary | ICD-10-CM | POA: Diagnosis not present

## 2018-10-08 DIAGNOSIS — E782 Mixed hyperlipidemia: Secondary | ICD-10-CM | POA: Diagnosis not present

## 2018-10-08 DIAGNOSIS — I2510 Atherosclerotic heart disease of native coronary artery without angina pectoris: Secondary | ICD-10-CM | POA: Diagnosis not present

## 2018-10-08 DIAGNOSIS — E119 Type 2 diabetes mellitus without complications: Secondary | ICD-10-CM | POA: Diagnosis not present

## 2018-10-08 DIAGNOSIS — M1712 Unilateral primary osteoarthritis, left knee: Secondary | ICD-10-CM | POA: Diagnosis not present

## 2018-10-09 DIAGNOSIS — H02135 Senile ectropion of left lower eyelid: Secondary | ICD-10-CM | POA: Diagnosis not present

## 2018-10-09 DIAGNOSIS — H10413 Chronic giant papillary conjunctivitis, bilateral: Secondary | ICD-10-CM | POA: Diagnosis not present

## 2018-10-09 DIAGNOSIS — H02132 Senile ectropion of right lower eyelid: Secondary | ICD-10-CM | POA: Diagnosis not present

## 2018-10-09 DIAGNOSIS — H16143 Punctate keratitis, bilateral: Secondary | ICD-10-CM | POA: Diagnosis not present

## 2018-11-09 DIAGNOSIS — E782 Mixed hyperlipidemia: Secondary | ICD-10-CM | POA: Diagnosis not present

## 2018-11-09 DIAGNOSIS — I10 Essential (primary) hypertension: Secondary | ICD-10-CM | POA: Diagnosis not present

## 2018-11-09 DIAGNOSIS — I2510 Atherosclerotic heart disease of native coronary artery without angina pectoris: Secondary | ICD-10-CM | POA: Diagnosis not present

## 2018-11-09 DIAGNOSIS — M1712 Unilateral primary osteoarthritis, left knee: Secondary | ICD-10-CM | POA: Diagnosis not present

## 2018-11-09 DIAGNOSIS — E1165 Type 2 diabetes mellitus with hyperglycemia: Secondary | ICD-10-CM | POA: Diagnosis not present

## 2018-11-09 DIAGNOSIS — E119 Type 2 diabetes mellitus without complications: Secondary | ICD-10-CM | POA: Diagnosis not present

## 2018-11-20 ENCOUNTER — Ambulatory Visit (INDEPENDENT_AMBULATORY_CARE_PROVIDER_SITE_OTHER): Payer: Medicare Other | Admitting: Sports Medicine

## 2018-11-20 ENCOUNTER — Other Ambulatory Visit: Payer: Self-pay

## 2018-11-20 ENCOUNTER — Encounter: Payer: Self-pay | Admitting: Sports Medicine

## 2018-11-20 DIAGNOSIS — E1142 Type 2 diabetes mellitus with diabetic polyneuropathy: Secondary | ICD-10-CM

## 2018-11-20 DIAGNOSIS — B351 Tinea unguium: Secondary | ICD-10-CM

## 2018-11-20 DIAGNOSIS — L84 Corns and callosities: Secondary | ICD-10-CM

## 2018-11-20 DIAGNOSIS — M79675 Pain in left toe(s): Secondary | ICD-10-CM | POA: Diagnosis not present

## 2018-11-20 DIAGNOSIS — M79674 Pain in right toe(s): Secondary | ICD-10-CM

## 2018-11-20 DIAGNOSIS — M79671 Pain in right foot: Secondary | ICD-10-CM

## 2018-11-20 DIAGNOSIS — M79672 Pain in left foot: Secondary | ICD-10-CM | POA: Diagnosis not present

## 2018-11-20 NOTE — Progress Notes (Signed)
Subjective: Brittany Rojas is a 73 y.o. female patient with history of diabetes who presents to office today complaining of painful callus and nails; unable to trim. Patient states that the glucose reading today was 210.  Last A1c under 7.  Patient denies any other issues at this time.  PCP Dr. Jacelyn Rojas   Patient Active Problem List   Diagnosis Date Noted  . Coronary atherosclerosis of native coronary artery 12/08/2012  . DM (diabetes mellitus) ([place]) 02/28/2012  . Sleep apnea 10/29/2011  . NEUROPATHY 06/14/2008  . Hyperlipidemia 04/29/2007  . EXOGENOUS OBESITY 04/29/2007  . DEPRESSION 04/29/2007  . Essential hypertension 04/29/2007  . PERIPHERAL EDEMA 04/29/2007  . History of cardiovascular disorder 04/29/2007  . DIVERTICULITIS, HX OF 04/29/2007   Current Outpatient Medications on File Prior to Visit  Medication Sig Dispense Refill  . amLODipine (NORVASC) 2.5 MG tablet Take 2.5 mg by mouth daily.    Marland Kitchen aspirin 81 MG tablet Take 81 mg by mouth daily.    Marland Kitchen atorvastatin (LIPITOR) 20 MG tablet Take 20 mg by mouth daily.     Marland Kitchen atorvastatin (LIPITOR) 40 MG tablet     . Bimatoprost (LUMIGAN OP) Place 1 drop into both eyes at bedtime.    . COMBIGAN 0.2-0.5 % ophthalmic solution Place 1 drop into both eyes 2 (two) times daily.  2  . Continuous Blood Gluc Sensor (FREESTYLE LIBRE 14 DAY SENSOR) MISC 1 each by Does not apply route every 14 (fourteen) days. 2 each 5  . diclofenac sodium (VOLTAREN) 1 % GEL APPLY 2 GRAMS TO AREA UP TO 4 TIMES DAILY    . gabapentin (NEURONTIN) 300 MG capsule TAKE 3 CAPSULES BY MOUTH AT BEDTIME 270 capsule 3  . insulin NPH Human (HUMULIN N,NOVOLIN N) 100 UNIT/ML injection Inject 10 units into the skin each morning and 65 units into the skin each evening.    . insulin regular (NOVOLIN R,HUMULIN R) 100 units/mL injection Use as directed per scale three (3) times daily.    Marland Kitchen losartan-hydrochlorothiazide (HYZAAR) 100-25 MG tablet Take 1 tablet by mouth daily.     . Multiple  Vitamins-Minerals (MULTIVITAMIN PO) Take 1 tablet by mouth daily.    . nitroGLYCERIN (NITROSTAT) 0.4 MG SL tablet Place 1 tablet (0.4 mg total) under the tongue every 5 (five) minutes as needed for chest pain. 25 tablet 3  . NON FORMULARY Use CPAP machine each night.    . traMADol (ULTRAM) 50 MG tablet Take one (1) tablet (50 mg) by mouth every eight (8) to twelve (12) hours as needed for pain.  5  . [DISCONTINUED] telmisartan-hydrochlorothiazide (MICARDIS HCT) 80-12.5 MG per tablet Take 1 tablet by mouth daily.       No current facility-administered medications on file prior to visit.    Allergies  Allergen Reactions  . Erythromycin Other (See Comments)    "upset stomach" (02/17/2012)  . Penicillins Other (See Comments)    ">50 yr ago, I had strept throat; had gotten lots and lots of PCN; last dose caused me to stop breathing" (02/17/2012)  . Tetracycline Other (See Comments)    "upset stomach" (02/17/2012)  . Sulfonamide Derivatives Rash and Other (See Comments)    "upset stomach" (02/17/2012)    No results found for this or any previous visit (from the past 2160 hour(s)).  Objective: General: Patient is awake, alert, and oriented x 3 and in no acute distress.  Integument: Skin is warm, dry and supple bilateral. Nails long and, thickened and  dystrophic  with subungual debris, consistent with onychomycosis, 1-5 bilateral. No signs of infection.  Callus right sub-met 2 and left medial bunion.  Abrasion to right fifth toe with no surrounding signs of infection.  Remaining integument unremarkable.  Vasculature:  Dorsalis Pedis pulse 1/4 bilateral. Posterior Tibial pulse  1/4 bilateral. Capillary fill time <3 sec 1-5 bilateral.  Scant hair growth to the level of the digits.Temperature gradient within normal limits.  Moderate varicosities present bilateral. No edema present bilateral.   Neurology: The patient has intact sensation measured with a 5.07/10g Semmes Weinstein Monofilament at all pedal  sites bilateral . Vibratory sensation diminished bilateral with tuning fork. No Babinski sign present bilateral.   Musculoskeletal: Bunion and hammertoe with pes planus pedal deformities noted bilateral. Muscular strength 5/5 in all lower extremity muscular groups bilateral without pain on range of motion . No tenderness with calf compression bilateral.  Assessment and Plan: Problem List Items Addressed This Visit    None    Visit Diagnoses    Pain due to onychomycosis of toenails of both feet    -  Primary   Callus of foot       Foot pain, bilateral       Diabetic polyneuropathy associated with type 2 diabetes mellitus ([place])         -Examined patient. -Re-discussed and educated patient on diabetic foot care, especially with  regards to the vascular, neurological and musculoskeletal systems.  -Mechanically debrided all nails using a sterile nail nipper and debrided callus x2 at right sub-met 2 and left medial bunion using sterile chisel blade and filed with dremel without incident -Patient to return in 10-12 weeks for nail and callus care  -Patient advised to call the office if any problems or questions arise in the meantime.  Landis Martins, DPM

## 2018-12-03 DIAGNOSIS — M1712 Unilateral primary osteoarthritis, left knee: Secondary | ICD-10-CM | POA: Diagnosis not present

## 2018-12-08 DIAGNOSIS — E1165 Type 2 diabetes mellitus with hyperglycemia: Secondary | ICD-10-CM | POA: Diagnosis not present

## 2018-12-08 DIAGNOSIS — E119 Type 2 diabetes mellitus without complications: Secondary | ICD-10-CM | POA: Diagnosis not present

## 2018-12-08 DIAGNOSIS — I10 Essential (primary) hypertension: Secondary | ICD-10-CM | POA: Diagnosis not present

## 2018-12-08 DIAGNOSIS — Z23 Encounter for immunization: Secondary | ICD-10-CM | POA: Diagnosis not present

## 2018-12-08 DIAGNOSIS — I2510 Atherosclerotic heart disease of native coronary artery without angina pectoris: Secondary | ICD-10-CM | POA: Diagnosis not present

## 2018-12-08 DIAGNOSIS — M1712 Unilateral primary osteoarthritis, left knee: Secondary | ICD-10-CM | POA: Diagnosis not present

## 2018-12-08 DIAGNOSIS — E782 Mixed hyperlipidemia: Secondary | ICD-10-CM | POA: Diagnosis not present

## 2018-12-17 NOTE — Progress Notes (Signed)
Cardiology Office Note:    Date:  12/18/2018   ID:  ESCARLET DEHN, DOB [birth year], MRN [id number]  PCP:  [name], MD  Cardiologist:  Sinclair Grooms, MD   Referring MD: [name], MD   Chief Complaint  Patient presents with  . Coronary Artery Disease    History of Present Illness:    Brittany Rojas is a 73 y.o. female with a hx of obtuse marginal #1 DES 2014, hypertension, obstructive sleep apnea, and hyperlipidemia.   Brittany Rojas tells me that she no longer drives because she had an episode of hypoglycemia while driving.  She has not had angina.  Denies orthopnea, PND, palpitations, and syncope.  There is no peripheral edema.  She denies syncope other than the one occasion when she developed low blood sugar while driving.  Past Medical History:  Diagnosis Date  . Bronchitis    "touch q now and then" (02/17/2012)  . Depression   . Diverticulitis   . History of blood transfusion 1945   "when I was 30 days old" (02/17/2012)  . History of transient ischemic attack (TIA) 2001   "possibly; never really pinned down what it was" (02/17/2012)  . Hyperlipidemia   . Hypertension   . Migraines    "outgrew them I guess" (02/17/2012)  . OSA on CPAP   . Pneumonia 12/2010  . Shortness of breath    "occasionally; at any time" (02/17/2012)  . Type II diabetes mellitus ([place])     Past Surgical History:  Procedure Laterality Date  . ABDOMINAL HYSTERECTOMY  2000's  . APPENDECTOMY  04/1988  . CARDIAC CATHETERIZATION  02/13/2012  . COLECTOMY  01/18/1988   "took 3 feet out; result of lots of burst diverticula" (02/17/2012)  . COLECTOMY  04/1988   "9 more inches of colon" (02/17/2012)  . COLOSTOMY  01/18/1988  . COLOSTOMY REVERSAL  04/1988  . CORONARY ANGIOPLASTY WITH STENT PLACEMENT  02/17/2012   "1; first one ever" (02/17/2012)  . LEFT OOPHORECTOMY  1989  . PERCUTANEOUS CORONARY STENT INTERVENTION (PCI-S) N/A 02/17/2012   Procedure: PERCUTANEOUS CORONARY STENT INTERVENTION (PCI-S);  Surgeon: Sinclair Grooms, MD;  Location: [facility] CATH LAB;  Service: Cardiovascular;  Laterality: N/A;    Current Medications: Current Meds  Medication Sig  . amLODipine (NORVASC) 2.5 MG tablet Take 2.5 mg by mouth daily.  Marland Kitchen aspirin 81 MG tablet Take 81 mg by mouth daily.  Marland Kitchen atorvastatin (LIPITOR) 80 MG tablet Take 80 mg by mouth daily.   . Bimatoprost (LUMIGAN OP) Place 1 drop into both eyes at bedtime.  . COMBIGAN 0.2-0.5 % ophthalmic solution Place 1 drop into both eyes 2 (two) times daily.  . Continuous Blood Gluc Sensor (FREESTYLE LIBRE 14 DAY SENSOR) MISC 1 each by Does not apply route every 14 (fourteen) days.  . diclofenac sodium (VOLTAREN) 1 % GEL APPLY 2 GRAMS TO AREA UP TO 4 TIMES DAILY  . gabapentin (NEURONTIN) 300 MG capsule TAKE 3 CAPSULES BY MOUTH AT BEDTIME  . hydrochlorothiazide (HYDRODIURIL) 25 MG tablet Take 25 mg by mouth daily.  . insulin NPH Human (HUMULIN N,NOVOLIN N) 100 UNIT/ML injection Inject 10 units into the skin each morning and 65 units into the skin each evening.  . insulin regular (NOVOLIN R,HUMULIN R) 100 units/mL injection Use as directed per scale three (3) times daily.  Marland Kitchen losartan (COZAAR) 100 MG tablet Take 100 mg by mouth daily.  . Multiple Vitamins-Minerals (MULTIVITAMIN PO) Take 1 tablet by  mouth daily.  . nitroGLYCERIN (NITROSTAT) 0.4 MG SL tablet Place 1 tablet (0.4 mg total) under the tongue every 5 (five) minutes as needed for chest pain.  . NON FORMULARY Use CPAP machine each night.  . traMADol (ULTRAM) 50 MG tablet Take one (1) tablet (50 mg) by mouth every eight (8) to twelve (12) hours as needed for pain.     Allergies:   Erythromycin, Penicillins, Tetracycline, and Sulfonamide derivatives   Social History   Socioeconomic History  . Marital status: Married    Spouse name: Not on file  . Number of children: Not on file  . Years of education: Not on file  . Highest education level: Not on file  Occupational History  . Not on file  Social Needs  .  Financial resource strain: Not on file  . Food insecurity    Worry: Not on file    Inability: Not on file  . Transportation needs    Medical: Not on file    Non-medical: Not on file  Tobacco Use  . Smoking status: Former Smoker    Years: 2.00    Types: Cigarettes    Quit date: 11/11/1969    Years since quitting: 49.1  . Smokeless tobacco: Never Used  Substance and Sexual Activity  . Alcohol use: No  . Drug use: No  . Sexual activity: Not Currently  Lifestyle  . Physical activity    Days per week: Not on file    Minutes per session: Not on file  . Stress: Not on file  Relationships  . Social Herbalist on phone: Not on file    Gets together: Not on file    Attends religious service: Not on file    Active member of club or organization: Not on file    Attends meetings of clubs or organizations: Not on file    Relationship status: Not on file  Other Topics Concern  . Not on file  Social History Narrative  . Not on file     Family History: The patient's family history includes Angina in her mother; Diabetes in her mother; Lung cancer in her father; Other in her sister.  ROS:   Please see the history of present illness.    Arthritis left greater than right knee limits mobility and prevents exercise.  All other systems reviewed and are negative.  EKGs/Labs/Other Studies Reviewed:    The following studies were reviewed today: No new data  EKG:  EKG demonstrates normal sinus rhythm, incomplete right bundle branch block, heart rate of 81 bpm.  When compared to prior tracings, the incomplete right bundle is new and the heart rate is faster.  Recent Labs: No results found for requested labs within last 8760 hours.  Recent Lipid Panel No results found for: CHOL, TRIG, HDL, CHOLHDL, VLDL, LDLCALC, LDLDIRECT  Physical Exam:    VS:  BP (!) 122/54   Pulse 81   Ht 5\' 2"  (1.575 m)   Wt 195 lb 1.9 oz (88.5 kg)   SpO2 97%   BMI 35.69 kg/m     Wt Readings from  Last 3 Encounters:  12/18/18 195 lb 1.9 oz (88.5 kg)  11/18/17 209 lb 3.2 oz (94.9 kg)  07/01/17 219 lb 6.4 oz (99.5 kg)     GEN: Obese. No acute distress HEENT: Normal NECK: No JVD. LYMPHATICS: No lymphadenopathy CARDIAC: 2/6 systolic right upper sternal border murmur RRR without diastolic murmur, gallop, or edema. VASCULAR:  Normal Pulses.  No bruits. RESPIRATORY:  Clear to auscultation without rales, wheezing or rhonchi  ABDOMEN: Soft, non-tender, non-distended, No pulsatile mass, MUSCULOSKELETAL: No deformity  SKIN: Warm and dry NEUROLOGIC:  Alert and oriented x 3 PSYCHIATRIC:  Normal affect   ASSESSMENT:    1. Atherosclerosis of native coronary artery of native heart without angina pectoris   2. Essential hypertension   3. Hyperlipidemia, unspecified hyperlipidemia type   4. Diabetes mellitus due to underlying condition with other kidney complication, unspecified whether long term insulin use ([place])   5. Sleep apnea, unspecified type   6. Educated about COVID-19 virus infection    PLAN:    In order of problems listed above:  1. Secondary prevention discussed.  Each risk factor needs some work.  She is overweight.  We discussed decreasing caloric intake.  She gets less than 6 hours of sleep regularly.  LDL cholesterol is 93 despite high intensity atorvastatin.  Hemoglobin A1c was 8 in July 2020. 2. Blood pressure is excellent 3. Decrease saturated fat in diet. 4. Increase physical activity and decrease carbohydrate intake. 5. CPAP compliance is encouraged. 6. She is practicing the 3W's  Overall education and awareness concerning primary/secondary risk prevention was discussed in detail: LDL less than 70, hemoglobin A1c less than 7, blood pressure target less than 130/80 mmHg, >150 minutes of moderate aerobic activity per week, avoidance of smoking, weight control (via diet and exercise), and continued surveillance/management of/for obstructive sleep apnea.    Medication  Adjustments/Labs and Tests Ordered: Current medicines are reviewed at length with the patient today.  Concerns regarding medicines are outlined above.  No orders of the defined types were placed in this encounter.  No orders of the defined types were placed in this encounter.   There are no Patient Instructions on file for this visit.   Signed, Sinclair Grooms, MD  12/18/2018 4:31 PM    [facility]

## 2018-12-18 ENCOUNTER — Ambulatory Visit (INDEPENDENT_AMBULATORY_CARE_PROVIDER_SITE_OTHER): Payer: Medicare Other | Admitting: Interventional Cardiology

## 2018-12-18 ENCOUNTER — Other Ambulatory Visit: Payer: Self-pay

## 2018-12-18 ENCOUNTER — Encounter

## 2018-12-18 ENCOUNTER — Encounter: Payer: Self-pay | Admitting: Interventional Cardiology

## 2018-12-18 VITALS — BP 122/54 | HR 81 | Ht 62.0 in | Wt 195.1 lb

## 2018-12-18 DIAGNOSIS — G4730 Sleep apnea, unspecified: Secondary | ICD-10-CM | POA: Diagnosis not present

## 2018-12-18 DIAGNOSIS — E0829 Diabetes mellitus due to underlying condition with other diabetic kidney complication: Secondary | ICD-10-CM

## 2018-12-18 DIAGNOSIS — E785 Hyperlipidemia, unspecified: Secondary | ICD-10-CM | POA: Diagnosis not present

## 2018-12-18 DIAGNOSIS — I10 Essential (primary) hypertension: Secondary | ICD-10-CM | POA: Diagnosis not present

## 2018-12-18 DIAGNOSIS — I2510 Atherosclerotic heart disease of native coronary artery without angina pectoris: Secondary | ICD-10-CM | POA: Diagnosis not present

## 2018-12-18 DIAGNOSIS — Z7189 Other specified counseling: Secondary | ICD-10-CM

## 2018-12-18 NOTE — Patient Instructions (Signed)

## 2019-01-27 ENCOUNTER — Encounter: Payer: Self-pay | Admitting: Sports Medicine

## 2019-01-27 ENCOUNTER — Other Ambulatory Visit: Payer: Self-pay

## 2019-01-27 ENCOUNTER — Ambulatory Visit (INDEPENDENT_AMBULATORY_CARE_PROVIDER_SITE_OTHER): Payer: Medicare Other | Admitting: Sports Medicine

## 2019-01-27 DIAGNOSIS — M79675 Pain in left toe(s): Secondary | ICD-10-CM | POA: Diagnosis not present

## 2019-01-27 DIAGNOSIS — B351 Tinea unguium: Secondary | ICD-10-CM | POA: Diagnosis not present

## 2019-01-27 DIAGNOSIS — L84 Corns and callosities: Secondary | ICD-10-CM

## 2019-01-27 DIAGNOSIS — M79672 Pain in left foot: Secondary | ICD-10-CM

## 2019-01-27 DIAGNOSIS — E1142 Type 2 diabetes mellitus with diabetic polyneuropathy: Secondary | ICD-10-CM | POA: Diagnosis not present

## 2019-01-27 DIAGNOSIS — M79671 Pain in right foot: Secondary | ICD-10-CM

## 2019-01-27 DIAGNOSIS — M79674 Pain in right toe(s): Secondary | ICD-10-CM

## 2019-01-27 NOTE — Progress Notes (Signed)
Subjective: Brittany Rojas is a 73 y.o. female patient with history of diabetes who presents to office today complaining of painful callus and nails; unable to trim. Reports callus on bottom of right foot is really sore today. Patient states that the glucose reading today was 141.  Last A1c 6.8  Patient denies any other issues at this time.  PCP Dr. Jacelyn Grip x while back  Patient Active Problem List   Diagnosis Date Noted  . Coronary atherosclerosis of native coronary artery 12/08/2012  . DM (diabetes mellitus) ([place]) 02/28/2012  . Sleep apnea 10/29/2011  . NEUROPATHY 06/14/2008  . Hyperlipidemia 04/29/2007  . EXOGENOUS OBESITY 04/29/2007  . DEPRESSION 04/29/2007  . Essential hypertension 04/29/2007  . PERIPHERAL EDEMA 04/29/2007  . History of cardiovascular disorder 04/29/2007  . DIVERTICULITIS, HX OF 04/29/2007   Current Outpatient Medications on File Prior to Visit  Medication Sig Dispense Refill  . amLODipine (NORVASC) 2.5 MG tablet Take 2.5 mg by mouth daily.    Marland Kitchen aspirin 81 MG tablet Take 81 mg by mouth daily.    Marland Kitchen atorvastatin (LIPITOR) 80 MG tablet Take 80 mg by mouth daily.     . Bimatoprost (LUMIGAN OP) Place 1 drop into both eyes at bedtime.    . COMBIGAN 0.2-0.5 % ophthalmic solution Place 1 drop into both eyes 2 (two) times daily.  2  . Continuous Blood Gluc Sensor (FREESTYLE LIBRE 14 DAY SENSOR) MISC 1 each by Does not apply route every 14 (fourteen) days. 2 each 5  . diclofenac sodium (VOLTAREN) 1 % GEL APPLY 2 GRAMS TO AREA UP TO 4 TIMES DAILY    . gabapentin (NEURONTIN) 300 MG capsule TAKE 3 CAPSULES BY MOUTH AT BEDTIME 270 capsule 3  . hydrochlorothiazide (HYDRODIURIL) 25 MG tablet Take 25 mg by mouth daily.    . insulin NPH Human (HUMULIN N,NOVOLIN N) 100 UNIT/ML injection Inject 10 units into the skin each morning and 65 units into the skin each evening.    . insulin regular (NOVOLIN R,HUMULIN R) 100 units/mL injection Use as directed per scale three (3) times daily.     Marland Kitchen losartan (COZAAR) 100 MG tablet Take 100 mg by mouth daily.    . Multiple Vitamins-Minerals (MULTIVITAMIN PO) Take 1 tablet by mouth daily.    . nitroGLYCERIN (NITROSTAT) 0.4 MG SL tablet Place 1 tablet (0.4 mg total) under the tongue every 5 (five) minutes as needed for chest pain. 25 tablet 3  . NON FORMULARY Use CPAP machine each night.    . traMADol (ULTRAM) 50 MG tablet Take one (1) tablet (50 mg) by mouth every eight (8) to twelve (12) hours as needed for pain.  5  . [DISCONTINUED] telmisartan-hydrochlorothiazide (MICARDIS HCT) 80-12.5 MG per tablet Take 1 tablet by mouth daily.       No current facility-administered medications on file prior to visit.   Allergies  Allergen Reactions  . Erythromycin Other (See Comments)    "upset stomach" (02/17/2012)  . Penicillins Other (See Comments)    ">50 yr ago, I had strept throat; had gotten lots and lots of PCN; last dose caused me to stop breathing" (02/17/2012)  . Tetracycline Other (See Comments)    "upset stomach" (02/17/2012)  . Sulfonamide Derivatives Rash and Other (See Comments)    "upset stomach" (02/17/2012)    No results found for this or any previous visit (from the past 2160 hour(s)).  Objective: General: Patient is awake, alert, and oriented x 3 and in no acute distress.  Integument: Skin is warm, dry and supple bilateral. Nails long and, thickened and dystrophic with subungual debris, consistent with onychomycosis, 1-5 bilateral. No signs of infection.  Callus right sub-met 2 and left medial bunion.  Remaining integument unremarkable.  Vasculature:  Dorsalis Pedis pulse 1/4 bilateral. Posterior Tibial pulse  1/4 bilateral. Capillary fill time <3 sec 1-5 bilateral.  Scant hair growth to the level of the digits.Temperature gradient within normal limits.  Moderate varicosities present bilateral. No edema present bilateral.   Neurology: The patient has intact sensation measured with a 5.07/10g Semmes Weinstein Monofilament at all  pedal sites bilateral . Vibratory sensation diminished bilateral with tuning fork. No Babinski sign present bilateral.   Musculoskeletal: Bunion and hammertoe with pes planus pedal deformities noted bilateral. Muscular strength 5/5 in all lower extremity muscular groups bilateral without pain on range of motion . No tenderness with calf compression bilateral.  Assessment and Plan: Problem List Items Addressed This Visit    None    Visit Diagnoses    Pain due to onychomycosis of toenails of both feet    -  Primary   Callus of foot       Foot pain, bilateral       Diabetic polyneuropathy associated with type 2 diabetes mellitus ([place])         -Examined patient. -Discussed and educated patient on diabetic foot care, especially with  regards to the vascular, neurological and musculoskeletal systems.  -Mechanically debrided all nails using a sterile nail nipper and debrided callus x2 at right sub-met 2 and left medial bunion using sterile chisel blade and filed with dremel without incident -Advised new shoes with offloading padding -Dispensed same of foot miracle cream to use to dry skin areas  -Patient to return in 10-12 weeks for nail and callus care  -Patient advised to call the office if any problems or questions arise in the meantime.  Landis Martins, DPM

## 2019-02-11 DIAGNOSIS — I2510 Atherosclerotic heart disease of native coronary artery without angina pectoris: Secondary | ICD-10-CM | POA: Diagnosis not present

## 2019-02-11 DIAGNOSIS — I10 Essential (primary) hypertension: Secondary | ICD-10-CM | POA: Diagnosis not present

## 2019-02-11 DIAGNOSIS — M1712 Unilateral primary osteoarthritis, left knee: Secondary | ICD-10-CM | POA: Diagnosis not present

## 2019-02-11 DIAGNOSIS — E782 Mixed hyperlipidemia: Secondary | ICD-10-CM | POA: Diagnosis not present

## 2019-02-11 DIAGNOSIS — E1165 Type 2 diabetes mellitus with hyperglycemia: Secondary | ICD-10-CM | POA: Diagnosis not present

## 2019-02-11 DIAGNOSIS — E119 Type 2 diabetes mellitus without complications: Secondary | ICD-10-CM | POA: Diagnosis not present

## 2019-03-03 DIAGNOSIS — E119 Type 2 diabetes mellitus without complications: Secondary | ICD-10-CM | POA: Diagnosis not present

## 2019-03-03 DIAGNOSIS — E782 Mixed hyperlipidemia: Secondary | ICD-10-CM | POA: Diagnosis not present

## 2019-03-03 DIAGNOSIS — I10 Essential (primary) hypertension: Secondary | ICD-10-CM | POA: Diagnosis not present

## 2019-03-03 DIAGNOSIS — E1165 Type 2 diabetes mellitus with hyperglycemia: Secondary | ICD-10-CM | POA: Diagnosis not present

## 2019-03-03 DIAGNOSIS — I2510 Atherosclerotic heart disease of native coronary artery without angina pectoris: Secondary | ICD-10-CM | POA: Diagnosis not present

## 2019-03-03 DIAGNOSIS — M1712 Unilateral primary osteoarthritis, left knee: Secondary | ICD-10-CM | POA: Diagnosis not present

## 2019-04-28 ENCOUNTER — Ambulatory Visit (INDEPENDENT_AMBULATORY_CARE_PROVIDER_SITE_OTHER): Payer: Medicare Other | Admitting: Sports Medicine

## 2019-04-28 ENCOUNTER — Other Ambulatory Visit: Payer: Self-pay

## 2019-04-28 ENCOUNTER — Encounter: Payer: Self-pay | Admitting: Sports Medicine

## 2019-04-28 DIAGNOSIS — M79672 Pain in left foot: Secondary | ICD-10-CM

## 2019-04-28 DIAGNOSIS — L84 Corns and callosities: Secondary | ICD-10-CM | POA: Diagnosis not present

## 2019-04-28 DIAGNOSIS — M79674 Pain in right toe(s): Secondary | ICD-10-CM

## 2019-04-28 DIAGNOSIS — B351 Tinea unguium: Secondary | ICD-10-CM | POA: Diagnosis not present

## 2019-04-28 DIAGNOSIS — M79675 Pain in left toe(s): Secondary | ICD-10-CM

## 2019-04-28 DIAGNOSIS — E1142 Type 2 diabetes mellitus with diabetic polyneuropathy: Secondary | ICD-10-CM | POA: Diagnosis not present

## 2019-04-28 DIAGNOSIS — M79671 Pain in right foot: Secondary | ICD-10-CM | POA: Diagnosis not present

## 2019-04-28 DIAGNOSIS — L989 Disorder of the skin and subcutaneous tissue, unspecified: Secondary | ICD-10-CM | POA: Diagnosis not present

## 2019-04-28 NOTE — Progress Notes (Signed)
Subjective: Brittany Rojas is a 74 y.o. female patient with history of diabetes who presents to office today complaining of painful callus and nails; unable to trim. Reports callus on bottom of right foot still gets pretty sore sometimes.  Patient states that the glucose reading today was 179.  Last A1c 6.8  Patient denies any other issues at this time.  PCP Dr. Jacelyn Grip x not sure of last visit  Patient Active Problem List   Diagnosis Date Noted  . Coronary atherosclerosis of native coronary artery 12/08/2012  . DM (diabetes mellitus) ([place]) 02/28/2012  . Sleep apnea 10/29/2011  . NEUROPATHY 06/14/2008  . Hyperlipidemia 04/29/2007  . EXOGENOUS OBESITY 04/29/2007  . DEPRESSION 04/29/2007  . Essential hypertension 04/29/2007  . PERIPHERAL EDEMA 04/29/2007  . History of cardiovascular disorder 04/29/2007  . DIVERTICULITIS, HX OF 04/29/2007   Current Outpatient Medications on File Prior to Visit  Medication Sig Dispense Refill  . amLODipine (NORVASC) 2.5 MG tablet Take 2.5 mg by mouth daily.    Marland Kitchen aspirin 81 MG tablet Take 81 mg by mouth daily.    Marland Kitchen atorvastatin (LIPITOR) 80 MG tablet Take 80 mg by mouth daily.     . Bimatoprost (LUMIGAN OP) Place 1 drop into both eyes at bedtime.    . COMBIGAN 0.2-0.5 % ophthalmic solution Place 1 drop into both eyes 2 (two) times daily.  2  . Continuous Blood Gluc Sensor (FREESTYLE LIBRE 14 DAY SENSOR) MISC 1 each by Does not apply route every 14 (fourteen) days. 2 each 5  . diclofenac sodium (VOLTAREN) 1 % GEL APPLY 2 GRAMS TO AREA UP TO 4 TIMES DAILY    . gabapentin (NEURONTIN) 300 MG capsule TAKE 3 CAPSULES BY MOUTH AT BEDTIME 270 capsule 3  . hydrochlorothiazide (HYDRODIURIL) 25 MG tablet Take 25 mg by mouth daily.    . insulin NPH Human (HUMULIN N,NOVOLIN N) 100 UNIT/ML injection Inject 10 units into the skin each morning and 65 units into the skin each evening.    . insulin regular (NOVOLIN R,HUMULIN R) 100 units/mL injection Use as directed per scale  three (3) times daily.    Marland Kitchen losartan (COZAAR) 100 MG tablet Take 100 mg by mouth daily.    . Multiple Vitamins-Minerals (MULTIVITAMIN PO) Take 1 tablet by mouth daily.    . nitroGLYCERIN (NITROSTAT) 0.4 MG SL tablet Place 1 tablet (0.4 mg total) under the tongue every 5 (five) minutes as needed for chest pain. 25 tablet 3  . NON FORMULARY Use CPAP machine each night.    . traMADol (ULTRAM) 50 MG tablet Take one (1) tablet (50 mg) by mouth every eight (8) to twelve (12) hours as needed for pain.  5  . [DISCONTINUED] telmisartan-hydrochlorothiazide (MICARDIS HCT) 80-12.5 MG per tablet Take 1 tablet by mouth daily.       No current facility-administered medications on file prior to visit.   Allergies  Allergen Reactions  . Erythromycin Other (See Comments)    "upset stomach" (02/17/2012)  . Penicillins Other (See Comments)    ">50 yr ago, I had strept throat; had gotten lots and lots of PCN; last dose caused me to stop breathing" (02/17/2012)  . Tetracycline Other (See Comments)    "upset stomach" (02/17/2012)  . Sulfonamide Derivatives Rash and Other (See Comments)    "upset stomach" (02/17/2012)    No results found for this or any previous visit (from the past 2160 hour(s)).  Objective: General: Patient is awake, alert, and oriented x 3 and  in no acute distress.  Integument: Skin is warm, dry and supple bilateral. Nails long and, thickened and dystrophic with subungual debris, consistent with onychomycosis, 1-5 bilateral. No signs of infection.  Callus right sub-met 2 and left medial bunion.  Remaining integument unremarkable.  Vasculature:  Dorsalis Pedis pulse 1/4 bilateral. Posterior Tibial pulse  1/4 bilateral. Capillary fill time <3 sec 1-5 bilateral.  Scant hair growth to the level of the digits.Temperature gradient within normal limits.  Moderate varicosities present bilateral. No edema present bilateral.   Neurology: The patient has intact sensation measured with a 5.07/10g Semmes  Weinstein Monofilament at all pedal sites bilateral . Vibratory sensation diminished bilateral with tuning fork. No Babinski sign present bilateral.   Musculoskeletal: Bunion and hammertoe with pes planus pedal deformities noted bilateral. Muscular strength 5/5 in all lower extremity muscular groups bilateral without pain on range of motion . No tenderness with calf compression bilateral.  Assessment and Plan: Problem List Items Addressed This Visit    None    Visit Diagnoses    Pain due to onychomycosis of toenails of both feet    -  Primary   Callus of foot       Foot pain, bilateral       Diabetic polyneuropathy associated with type 2 diabetes mellitus (HCC)       Benign skin lesion         -Examined patient. -Re-discussed and educated patient on diabetic foot care, especially with  regards to the vascular, neurological and musculoskeletal systems.  -Mechanically debrided all nails using a sterile nail nipper and debrided callus x2 at right sub-met 2 and left medial bunion using sterile chisel blade and filed with dremel without incident -Recommend daily use of foot miracle cream to help reduce callusing -Patient to return in 10-12 weeks for nail and callus care  -Patient advised to call the office if any problems or questions arise in the meantime.  Landis Martins, DPM

## 2019-05-04 DIAGNOSIS — G4733 Obstructive sleep apnea (adult) (pediatric): Secondary | ICD-10-CM | POA: Diagnosis not present

## 2019-06-10 DIAGNOSIS — M1712 Unilateral primary osteoarthritis, left knee: Secondary | ICD-10-CM | POA: Diagnosis not present

## 2019-06-14 DIAGNOSIS — E782 Mixed hyperlipidemia: Secondary | ICD-10-CM | POA: Diagnosis not present

## 2019-06-14 DIAGNOSIS — L989 Disorder of the skin and subcutaneous tissue, unspecified: Secondary | ICD-10-CM | POA: Diagnosis not present

## 2019-06-14 DIAGNOSIS — E1142 Type 2 diabetes mellitus with diabetic polyneuropathy: Secondary | ICD-10-CM | POA: Diagnosis not present

## 2019-06-14 DIAGNOSIS — I2510 Atherosclerotic heart disease of native coronary artery without angina pectoris: Secondary | ICD-10-CM | POA: Diagnosis not present

## 2019-06-14 DIAGNOSIS — E669 Obesity, unspecified: Secondary | ICD-10-CM | POA: Diagnosis not present

## 2019-06-14 DIAGNOSIS — M1712 Unilateral primary osteoarthritis, left knee: Secondary | ICD-10-CM | POA: Diagnosis not present

## 2019-06-14 DIAGNOSIS — E1165 Type 2 diabetes mellitus with hyperglycemia: Secondary | ICD-10-CM | POA: Diagnosis not present

## 2019-06-14 DIAGNOSIS — I10 Essential (primary) hypertension: Secondary | ICD-10-CM | POA: Diagnosis not present

## 2019-06-28 DIAGNOSIS — C44712 Basal cell carcinoma of skin of right lower limb, including hip: Secondary | ICD-10-CM | POA: Diagnosis not present

## 2019-07-07 ENCOUNTER — Ambulatory Visit (INDEPENDENT_AMBULATORY_CARE_PROVIDER_SITE_OTHER): Payer: Medicare Other | Admitting: Sports Medicine

## 2019-07-07 ENCOUNTER — Encounter: Payer: Self-pay | Admitting: Sports Medicine

## 2019-07-07 ENCOUNTER — Other Ambulatory Visit: Payer: Self-pay

## 2019-07-07 DIAGNOSIS — M79672 Pain in left foot: Secondary | ICD-10-CM | POA: Diagnosis not present

## 2019-07-07 DIAGNOSIS — E1142 Type 2 diabetes mellitus with diabetic polyneuropathy: Secondary | ICD-10-CM | POA: Diagnosis not present

## 2019-07-07 DIAGNOSIS — M79675 Pain in left toe(s): Secondary | ICD-10-CM | POA: Diagnosis not present

## 2019-07-07 DIAGNOSIS — L989 Disorder of the skin and subcutaneous tissue, unspecified: Secondary | ICD-10-CM | POA: Diagnosis not present

## 2019-07-07 DIAGNOSIS — B351 Tinea unguium: Secondary | ICD-10-CM | POA: Diagnosis not present

## 2019-07-07 DIAGNOSIS — M79674 Pain in right toe(s): Secondary | ICD-10-CM | POA: Diagnosis not present

## 2019-07-07 DIAGNOSIS — L84 Corns and callosities: Secondary | ICD-10-CM | POA: Diagnosis not present

## 2019-07-07 DIAGNOSIS — M79671 Pain in right foot: Secondary | ICD-10-CM

## 2019-07-07 NOTE — Progress Notes (Signed)
Subjective: Brittany Rojas is a 74 y.o. female patient with history of diabetes who presents to office today complaining of painful callus and nails; unable to trim. Reports callus on bottom of right foot is sore today.  Patient states that the glucose reading today was 137 and last A1c was 6.7.  Patient denies any other issues at this time.  PCP Dr. Jacelyn Grip x 2 weeks ago  Patient Active Problem List   Diagnosis Date Noted  . Coronary atherosclerosis of native coronary artery 12/08/2012  . DM (diabetes mellitus) ([place]) 02/28/2012  . Sleep apnea 10/29/2011  . NEUROPATHY 06/14/2008  . Hyperlipidemia 04/29/2007  . EXOGENOUS OBESITY 04/29/2007  . DEPRESSION 04/29/2007  . Essential hypertension 04/29/2007  . PERIPHERAL EDEMA 04/29/2007  . History of cardiovascular disorder 04/29/2007  . DIVERTICULITIS, HX OF 04/29/2007   Current Outpatient Medications on File Prior to Visit  Medication Sig Dispense Refill  . amLODipine (NORVASC) 2.5 MG tablet Take 2.5 mg by mouth daily.    Marland Kitchen aspirin 81 MG tablet Take 81 mg by mouth daily.    Marland Kitchen atorvastatin (LIPITOR) 80 MG tablet Take 80 mg by mouth daily.     . Bimatoprost (LUMIGAN OP) Place 1 drop into both eyes at bedtime.    . COMBIGAN 0.2-0.5 % ophthalmic solution Place 1 drop into both eyes 2 (two) times daily.  2  . Continuous Blood Gluc Sensor (FREESTYLE LIBRE 14 DAY SENSOR) MISC 1 each by Does not apply route every 14 (fourteen) days. 2 each 5  . diclofenac sodium (VOLTAREN) 1 % GEL APPLY 2 GRAMS TO AREA UP TO 4 TIMES DAILY    . gabapentin (NEURONTIN) 300 MG capsule TAKE 3 CAPSULES BY MOUTH AT BEDTIME 270 capsule 3  . hydrochlorothiazide (HYDRODIURIL) 25 MG tablet Take 25 mg by mouth daily.    . insulin NPH Human (HUMULIN N,NOVOLIN N) 100 UNIT/ML injection Inject 10 units into the skin each morning and 65 units into the skin each evening.    . insulin regular (NOVOLIN R,HUMULIN R) 100 units/mL injection Use as directed per scale three (3) times daily.     Marland Kitchen losartan (COZAAR) 100 MG tablet Take 100 mg by mouth daily.    . Multiple Vitamins-Minerals (MULTIVITAMIN PO) Take 1 tablet by mouth daily.    . nitroGLYCERIN (NITROSTAT) 0.4 MG SL tablet Place 1 tablet (0.4 mg total) under the tongue every 5 (five) minutes as needed for chest pain. 25 tablet 3  . NON FORMULARY Use CPAP machine each night.    . traMADol (ULTRAM) 50 MG tablet Take one (1) tablet (50 mg) by mouth every eight (8) to twelve (12) hours as needed for pain.  5  . [DISCONTINUED] telmisartan-hydrochlorothiazide (MICARDIS HCT) 80-12.5 MG per tablet Take 1 tablet by mouth daily.       No current facility-administered medications on file prior to visit.   Allergies  Allergen Reactions  . Erythromycin Other (See Comments)    "upset stomach" (02/17/2012)  . Penicillins Other (See Comments)    ">50 yr ago, I had strept throat; had gotten lots and lots of PCN; last dose caused me to stop breathing" (02/17/2012)  . Tetracycline Other (See Comments)    "upset stomach" (02/17/2012)  . Sulfonamide Derivatives Rash and Other (See Comments)    "upset stomach" (02/17/2012)    No results found for this or any previous visit (from the past 2160 hour(s)).  Objective: General: Patient is awake, alert, and oriented x 3 and in no acute  distress.  Integument: Skin is warm, dry and supple bilateral. Nails long and, thickened and dystrophic with subungual debris, consistent with onychomycosis, 1-5 bilateral. No signs of infection.  Callus right sub-met 2 and left medial bunion.  Remaining integument unremarkable.  Vasculature:  Dorsalis Pedis pulse 1/4 bilateral. Posterior Tibial pulse  1/4 bilateral. Capillary fill time <3 sec 1-5 bilateral.  Scant hair growth to the level of the digits.Temperature gradient within normal limits.  Moderate varicosities present bilateral. No edema present bilateral.   Neurology: The patient has intact sensation measured with a 5.07/10g Semmes Weinstein Monofilament at all  pedal sites bilateral . Vibratory sensation diminished bilateral with tuning fork. No Babinski sign present bilateral.   Musculoskeletal: Bunion and hammertoe with pes planus pedal deformities noted bilateral. Muscular strength 5/5 in all lower extremity muscular groups bilateral without pain on range of motion . No tenderness with calf compression bilateral.  Assessment and Plan: Problem List Items Addressed This Visit    None    Visit Diagnoses    Pain due to onychomycosis of toenails of both feet    -  Primary   Benign skin lesion       Callus of foot       Foot pain, bilateral       Diabetic polyneuropathy associated with type 2 diabetes mellitus ([place])         -Examined patient. -Re-discussed and educated patient on diabetic foot care, especially with  regards to the vascular, neurological and musculoskeletal systems.  -Mechanically debrided all nails using a sterile nail nipper and debrided callus x2 at right sub-met 2 and left medial bunion using sterile chisel blade and filed with dremel without incident -Recommend daily use of foot miracle cream to help reduce callusing like before and applied moleskin to the area and advised patient to continue with using to see if this will help decrease buildup of callus to the plantar aspect of the right foot -Patient to return in 10-12 weeks for nail and callus care  -Patient advised to call the office if any problems or questions arise in the meantime.  Landis Martins, DPM

## 2019-07-09 DIAGNOSIS — E1165 Type 2 diabetes mellitus with hyperglycemia: Secondary | ICD-10-CM | POA: Diagnosis not present

## 2019-07-09 DIAGNOSIS — E119 Type 2 diabetes mellitus without complications: Secondary | ICD-10-CM | POA: Diagnosis not present

## 2019-07-09 DIAGNOSIS — I10 Essential (primary) hypertension: Secondary | ICD-10-CM | POA: Diagnosis not present

## 2019-07-09 DIAGNOSIS — E782 Mixed hyperlipidemia: Secondary | ICD-10-CM | POA: Diagnosis not present

## 2019-07-09 DIAGNOSIS — E1142 Type 2 diabetes mellitus with diabetic polyneuropathy: Secondary | ICD-10-CM | POA: Diagnosis not present

## 2019-07-09 DIAGNOSIS — I2510 Atherosclerotic heart disease of native coronary artery without angina pectoris: Secondary | ICD-10-CM | POA: Diagnosis not present

## 2019-07-09 DIAGNOSIS — M1712 Unilateral primary osteoarthritis, left knee: Secondary | ICD-10-CM | POA: Diagnosis not present

## 2019-07-13 DIAGNOSIS — I10 Essential (primary) hypertension: Secondary | ICD-10-CM | POA: Diagnosis not present

## 2019-07-13 DIAGNOSIS — M1712 Unilateral primary osteoarthritis, left knee: Secondary | ICD-10-CM | POA: Diagnosis not present

## 2019-07-13 DIAGNOSIS — E669 Obesity, unspecified: Secondary | ICD-10-CM | POA: Diagnosis not present

## 2019-07-13 DIAGNOSIS — I2510 Atherosclerotic heart disease of native coronary artery without angina pectoris: Secondary | ICD-10-CM | POA: Diagnosis not present

## 2019-07-13 DIAGNOSIS — E1165 Type 2 diabetes mellitus with hyperglycemia: Secondary | ICD-10-CM | POA: Diagnosis not present

## 2019-07-13 DIAGNOSIS — Z794 Long term (current) use of insulin: Secondary | ICD-10-CM | POA: Diagnosis not present

## 2019-07-14 DIAGNOSIS — C44712 Basal cell carcinoma of skin of right lower limb, including hip: Secondary | ICD-10-CM | POA: Diagnosis not present

## 2019-09-01 DIAGNOSIS — E119 Type 2 diabetes mellitus without complications: Secondary | ICD-10-CM | POA: Diagnosis not present

## 2019-09-01 DIAGNOSIS — I2510 Atherosclerotic heart disease of native coronary artery without angina pectoris: Secondary | ICD-10-CM | POA: Diagnosis not present

## 2019-09-01 DIAGNOSIS — M1712 Unilateral primary osteoarthritis, left knee: Secondary | ICD-10-CM | POA: Diagnosis not present

## 2019-09-01 DIAGNOSIS — E782 Mixed hyperlipidemia: Secondary | ICD-10-CM | POA: Diagnosis not present

## 2019-09-01 DIAGNOSIS — E1142 Type 2 diabetes mellitus with diabetic polyneuropathy: Secondary | ICD-10-CM | POA: Diagnosis not present

## 2019-09-01 DIAGNOSIS — I10 Essential (primary) hypertension: Secondary | ICD-10-CM | POA: Diagnosis not present

## 2019-09-01 DIAGNOSIS — E1165 Type 2 diabetes mellitus with hyperglycemia: Secondary | ICD-10-CM | POA: Diagnosis not present

## 2019-09-09 DIAGNOSIS — L97911 Non-pressure chronic ulcer of unspecified part of right lower leg limited to breakdown of skin: Secondary | ICD-10-CM | POA: Diagnosis not present

## 2019-09-15 ENCOUNTER — Ambulatory Visit: Payer: Medicare Other | Admitting: Sports Medicine

## 2019-09-15 DIAGNOSIS — L97911 Non-pressure chronic ulcer of unspecified part of right lower leg limited to breakdown of skin: Secondary | ICD-10-CM | POA: Diagnosis not present

## 2019-09-21 DIAGNOSIS — M1712 Unilateral primary osteoarthritis, left knee: Secondary | ICD-10-CM | POA: Diagnosis not present

## 2019-09-22 DIAGNOSIS — L97911 Non-pressure chronic ulcer of unspecified part of right lower leg limited to breakdown of skin: Secondary | ICD-10-CM | POA: Diagnosis not present

## 2019-09-28 ENCOUNTER — Ambulatory Visit (INDEPENDENT_AMBULATORY_CARE_PROVIDER_SITE_OTHER): Payer: Medicare Other | Admitting: Sports Medicine

## 2019-09-28 ENCOUNTER — Encounter: Payer: Self-pay | Admitting: Sports Medicine

## 2019-09-28 ENCOUNTER — Other Ambulatory Visit: Payer: Self-pay

## 2019-09-28 DIAGNOSIS — L84 Corns and callosities: Secondary | ICD-10-CM

## 2019-09-28 DIAGNOSIS — M79671 Pain in right foot: Secondary | ICD-10-CM

## 2019-09-28 DIAGNOSIS — B351 Tinea unguium: Secondary | ICD-10-CM | POA: Diagnosis not present

## 2019-09-28 DIAGNOSIS — M79675 Pain in left toe(s): Secondary | ICD-10-CM

## 2019-09-28 DIAGNOSIS — E1142 Type 2 diabetes mellitus with diabetic polyneuropathy: Secondary | ICD-10-CM

## 2019-09-28 DIAGNOSIS — L989 Disorder of the skin and subcutaneous tissue, unspecified: Secondary | ICD-10-CM

## 2019-09-28 DIAGNOSIS — M79674 Pain in right toe(s): Secondary | ICD-10-CM

## 2019-09-28 DIAGNOSIS — M79672 Pain in left foot: Secondary | ICD-10-CM

## 2019-09-28 NOTE — Progress Notes (Signed)
Subjective: Brittany Rojas is a 74 y.o. female patient with history of diabetes who presents to office today complaining of painful callus and nails; unable to trim. Reports callus on bottom of right foot is sore today like usual get some relief when I trim it.  Patient states that the glucose reading today was 121 and last A1c was 6.5.  Patient denies any other issues at this time.  PCP Dr. Jacelyn Grip x 4 weeks ago  Patient Active Problem List   Diagnosis Date Noted  . Coronary atherosclerosis of native coronary artery 12/08/2012  . DM (diabetes mellitus) ([place]) 02/28/2012  . Sleep apnea 10/29/2011  . NEUROPATHY 06/14/2008  . Hyperlipidemia 04/29/2007  . EXOGENOUS OBESITY 04/29/2007  . DEPRESSION 04/29/2007  . Essential hypertension 04/29/2007  . PERIPHERAL EDEMA 04/29/2007  . History of cardiovascular disorder 04/29/2007  . DIVERTICULITIS, HX OF 04/29/2007   Current Outpatient Medications on File Prior to Visit  Medication Sig Dispense Refill  . amLODipine (NORVASC) 2.5 MG tablet Take 2.5 mg by mouth daily.    Marland Kitchen aspirin 81 MG tablet Take 81 mg by mouth daily.    Marland Kitchen atorvastatin (LIPITOR) 80 MG tablet Take 80 mg by mouth daily.     . Bimatoprost (LUMIGAN OP) Place 1 drop into both eyes at bedtime.    . COMBIGAN 0.2-0.5 % ophthalmic solution Place 1 drop into both eyes 2 (two) times daily.  2  . Continuous Blood Gluc Sensor (FREESTYLE LIBRE 14 DAY SENSOR) MISC 1 each by Does not apply route every 14 (fourteen) days. 2 each 5  . diclofenac sodium (VOLTAREN) 1 % GEL APPLY 2 GRAMS TO AREA UP TO 4 TIMES DAILY    . gabapentin (NEURONTIN) 300 MG capsule TAKE 3 CAPSULES BY MOUTH AT BEDTIME 270 capsule 3  . hydrochlorothiazide (HYDRODIURIL) 25 MG tablet Take 25 mg by mouth daily.    . insulin NPH Human (HUMULIN N,NOVOLIN N) 100 UNIT/ML injection Inject 10 units into the skin each morning and 65 units into the skin each evening.    . insulin regular (NOVOLIN R,HUMULIN R) 100 units/mL injection Use as  directed per scale three (3) times daily.    Marland Kitchen losartan (COZAAR) 100 MG tablet Take 100 mg by mouth daily.    . Multiple Vitamins-Minerals (MULTIVITAMIN PO) Take 1 tablet by mouth daily.    . nitroGLYCERIN (NITROSTAT) 0.4 MG SL tablet Place 1 tablet (0.4 mg total) under the tongue every 5 (five) minutes as needed for chest pain. 25 tablet 3  . NON FORMULARY Use CPAP machine each night.    . traMADol (ULTRAM) 50 MG tablet Take one (1) tablet (50 mg) by mouth every eight (8) to twelve (12) hours as needed for pain.  5  . [DISCONTINUED] telmisartan-hydrochlorothiazide (MICARDIS HCT) 80-12.5 MG per tablet Take 1 tablet by mouth daily.       No current facility-administered medications on file prior to visit.   Allergies  Allergen Reactions  . Erythromycin Other (See Comments)    "upset stomach" (02/17/2012)  . Penicillins Other (See Comments)    ">50 yr ago, I had strept throat; had gotten lots and lots of PCN; last dose caused me to stop breathing" (02/17/2012)  . Tetracycline Other (See Comments)    "upset stomach" (02/17/2012)  . Sulfonamide Derivatives Rash and Other (See Comments)    "upset stomach" (02/17/2012)    No results found for this or any previous visit (from the past 2160 hour(s)).  Objective: General: Patient is awake,  alert, and oriented x 3 and in no acute distress.  Integument: Skin is warm, dry and supple bilateral. Nails long and, thickened and dystrophic with subungual debris, consistent with onychomycosis, 1-5 bilateral. No signs of infection.  Callus right sub-met 2 and left medial bunion.  Remaining integument unremarkable.  Vasculature:  Dorsalis Pedis pulse 1/4 bilateral. Posterior Tibial pulse  1/4 bilateral. Capillary fill time <3 sec 1-5 bilateral.  Scant hair growth to the level of the digits.Temperature gradient within normal limits.  Moderate varicosities present bilateral. No edema present bilateral.   Neurology: The patient has intact sensation measured with a  5.07/10g Semmes Weinstein Monofilament at all pedal sites bilateral . Vibratory sensation diminished bilateral with tuning fork. No Babinski sign present bilateral.   Musculoskeletal: Bunion and hammertoe with pes planus pedal deformities noted bilateral. Muscular strength 5/5 in all lower extremity muscular groups bilateral without pain on range of motion . No tenderness with calf compression bilateral.  Assessment and Plan: Problem List Items Addressed This Visit    None    Visit Diagnoses    Pain due to onychomycosis of toenails of both feet    -  Primary   Benign skin lesion       Callus of foot       Foot pain, bilateral       Diabetic polyneuropathy associated with type 2 diabetes mellitus ([place])         -Examined patient. -Re-discussed the importance of daily foot inspection setting diabetes -Mechanically debrided all nails using a sterile nail nipper and debrided callus x2 at right sub-met 2 and left medial bunion using sterile chisel blade and filed with dremel without incident -Advised good supportive shoes daily for foot type -Patient to return in 10-12 weeks for nail and callus care  -Patient advised to call the office if any problems or questions arise in the meantime.  Landis Martins, DPM

## 2019-09-29 DIAGNOSIS — L97911 Non-pressure chronic ulcer of unspecified part of right lower leg limited to breakdown of skin: Secondary | ICD-10-CM | POA: Diagnosis not present

## 2019-10-07 DIAGNOSIS — E1142 Type 2 diabetes mellitus with diabetic polyneuropathy: Secondary | ICD-10-CM | POA: Diagnosis not present

## 2019-10-07 DIAGNOSIS — E119 Type 2 diabetes mellitus without complications: Secondary | ICD-10-CM | POA: Diagnosis not present

## 2019-10-07 DIAGNOSIS — E1165 Type 2 diabetes mellitus with hyperglycemia: Secondary | ICD-10-CM | POA: Diagnosis not present

## 2019-10-07 DIAGNOSIS — E782 Mixed hyperlipidemia: Secondary | ICD-10-CM | POA: Diagnosis not present

## 2019-10-07 DIAGNOSIS — I10 Essential (primary) hypertension: Secondary | ICD-10-CM | POA: Diagnosis not present

## 2019-10-07 DIAGNOSIS — L97911 Non-pressure chronic ulcer of unspecified part of right lower leg limited to breakdown of skin: Secondary | ICD-10-CM | POA: Diagnosis not present

## 2019-10-07 DIAGNOSIS — M1712 Unilateral primary osteoarthritis, left knee: Secondary | ICD-10-CM | POA: Diagnosis not present

## 2019-10-07 DIAGNOSIS — I2510 Atherosclerotic heart disease of native coronary artery without angina pectoris: Secondary | ICD-10-CM | POA: Diagnosis not present

## 2019-10-14 DIAGNOSIS — L97911 Non-pressure chronic ulcer of unspecified part of right lower leg limited to breakdown of skin: Secondary | ICD-10-CM | POA: Diagnosis not present

## 2019-10-21 DIAGNOSIS — L97911 Non-pressure chronic ulcer of unspecified part of right lower leg limited to breakdown of skin: Secondary | ICD-10-CM | POA: Diagnosis not present

## 2019-10-28 DIAGNOSIS — L97911 Non-pressure chronic ulcer of unspecified part of right lower leg limited to breakdown of skin: Secondary | ICD-10-CM | POA: Diagnosis not present

## 2019-11-02 DIAGNOSIS — E1142 Type 2 diabetes mellitus with diabetic polyneuropathy: Secondary | ICD-10-CM | POA: Diagnosis not present

## 2019-11-02 DIAGNOSIS — E782 Mixed hyperlipidemia: Secondary | ICD-10-CM | POA: Diagnosis not present

## 2019-11-02 DIAGNOSIS — I2510 Atherosclerotic heart disease of native coronary artery without angina pectoris: Secondary | ICD-10-CM | POA: Diagnosis not present

## 2019-11-02 DIAGNOSIS — E1165 Type 2 diabetes mellitus with hyperglycemia: Secondary | ICD-10-CM | POA: Diagnosis not present

## 2019-11-02 DIAGNOSIS — I10 Essential (primary) hypertension: Secondary | ICD-10-CM | POA: Diagnosis not present

## 2019-11-02 DIAGNOSIS — E119 Type 2 diabetes mellitus without complications: Secondary | ICD-10-CM | POA: Diagnosis not present

## 2019-11-02 DIAGNOSIS — M1712 Unilateral primary osteoarthritis, left knee: Secondary | ICD-10-CM | POA: Diagnosis not present

## 2019-11-04 DIAGNOSIS — L97911 Non-pressure chronic ulcer of unspecified part of right lower leg limited to breakdown of skin: Secondary | ICD-10-CM | POA: Diagnosis not present

## 2019-11-05 DIAGNOSIS — M1712 Unilateral primary osteoarthritis, left knee: Secondary | ICD-10-CM | POA: Diagnosis not present

## 2019-11-06 DIAGNOSIS — Z23 Encounter for immunization: Secondary | ICD-10-CM | POA: Diagnosis not present

## 2019-11-11 DIAGNOSIS — L97911 Non-pressure chronic ulcer of unspecified part of right lower leg limited to breakdown of skin: Secondary | ICD-10-CM | POA: Diagnosis not present

## 2019-11-22 DIAGNOSIS — E119 Type 2 diabetes mellitus without complications: Secondary | ICD-10-CM | POA: Diagnosis not present

## 2019-11-22 DIAGNOSIS — I2510 Atherosclerotic heart disease of native coronary artery without angina pectoris: Secondary | ICD-10-CM | POA: Diagnosis not present

## 2019-11-22 DIAGNOSIS — E1165 Type 2 diabetes mellitus with hyperglycemia: Secondary | ICD-10-CM | POA: Diagnosis not present

## 2019-11-22 DIAGNOSIS — E1142 Type 2 diabetes mellitus with diabetic polyneuropathy: Secondary | ICD-10-CM | POA: Diagnosis not present

## 2019-11-22 DIAGNOSIS — I10 Essential (primary) hypertension: Secondary | ICD-10-CM | POA: Diagnosis not present

## 2019-11-22 DIAGNOSIS — E782 Mixed hyperlipidemia: Secondary | ICD-10-CM | POA: Diagnosis not present

## 2019-11-22 DIAGNOSIS — M1712 Unilateral primary osteoarthritis, left knee: Secondary | ICD-10-CM | POA: Diagnosis not present

## 2019-11-26 DIAGNOSIS — M1712 Unilateral primary osteoarthritis, left knee: Secondary | ICD-10-CM | POA: Diagnosis not present

## 2019-12-09 DIAGNOSIS — Z01818 Encounter for other preprocedural examination: Secondary | ICD-10-CM | POA: Diagnosis not present

## 2019-12-09 DIAGNOSIS — M79609 Pain in unspecified limb: Secondary | ICD-10-CM | POA: Diagnosis not present

## 2019-12-09 DIAGNOSIS — E559 Vitamin D deficiency, unspecified: Secondary | ICD-10-CM | POA: Diagnosis not present

## 2019-12-09 DIAGNOSIS — J90 Pleural effusion, not elsewhere classified: Secondary | ICD-10-CM | POA: Diagnosis not present

## 2019-12-09 DIAGNOSIS — Z79899 Other long term (current) drug therapy: Secondary | ICD-10-CM | POA: Diagnosis not present

## 2019-12-09 LAB — PROTIME-INR: INR: 1 (ref 0.9–1.1)

## 2019-12-10 DIAGNOSIS — I2510 Atherosclerotic heart disease of native coronary artery without angina pectoris: Secondary | ICD-10-CM | POA: Diagnosis not present

## 2019-12-10 DIAGNOSIS — Z23 Encounter for immunization: Secondary | ICD-10-CM | POA: Diagnosis not present

## 2019-12-10 DIAGNOSIS — G4733 Obstructive sleep apnea (adult) (pediatric): Secondary | ICD-10-CM | POA: Diagnosis not present

## 2019-12-10 DIAGNOSIS — E782 Mixed hyperlipidemia: Secondary | ICD-10-CM | POA: Diagnosis not present

## 2019-12-10 DIAGNOSIS — G629 Polyneuropathy, unspecified: Secondary | ICD-10-CM | POA: Diagnosis not present

## 2019-12-10 DIAGNOSIS — Z01818 Encounter for other preprocedural examination: Secondary | ICD-10-CM | POA: Diagnosis not present

## 2019-12-10 DIAGNOSIS — E119 Type 2 diabetes mellitus without complications: Secondary | ICD-10-CM | POA: Diagnosis not present

## 2019-12-10 DIAGNOSIS — Z794 Long term (current) use of insulin: Secondary | ICD-10-CM | POA: Diagnosis not present

## 2019-12-14 ENCOUNTER — Telehealth: Payer: Self-pay | Admitting: *Deleted

## 2019-12-14 NOTE — Telephone Encounter (Signed)
° °  [facility] Pre-operative Risk Assessment    HEARTCARE STAFF: - Please ensure there is not already an duplicate clearance open for this procedure. - Under Visit Info/Reason for Call, type in Other and utilize the format Clearance MM/DD/YY or Clearance TBD. Do not use dashes or single digits. - If request is for dental extraction, please clarify the # of teeth to be extracted.  Request for surgical clearance:  1. What type of surgery is being performed? LEFT TOTAL KNEE ARTHROPLASTY   2. When is this surgery scheduled? TBD   3. What type of clearance is required (medical clearance vs. Pharmacy clearance to hold med vs. Both)? MEDICAL  4. Are there any medications that need to be held prior to surgery and how long? ASA    5. Practice name and name of physician performing surgery? [facility]; DR. Deirdre Peer DURRANI   6. What is the office phone number? [phone]   7.   What is the office fax number? [phone]  8.   Anesthesia type (None, local, MAC, general) ? GENERAL   Brittany Rojas 12/14/2019, 11:54 AM  _________________________________________________________________   (provider comments below)

## 2019-12-15 NOTE — Telephone Encounter (Signed)
Updated appointment not to include "Pre-Op Clearance"

## 2019-12-15 NOTE — Telephone Encounter (Signed)
We need to be sure that I included a preop clearance during her upcoming office visit

## 2019-12-15 NOTE — Telephone Encounter (Signed)
Patient is seeing Dr. Tamala Julian this month for annual visit. Callback pool to update the reason behind follow up to include preop clearance. Will defer preop clearance to MD and remove from this pool

## 2019-12-17 ENCOUNTER — Ambulatory Visit (INDEPENDENT_AMBULATORY_CARE_PROVIDER_SITE_OTHER): Payer: Medicare Other | Admitting: Sports Medicine

## 2019-12-17 ENCOUNTER — Encounter: Payer: Self-pay | Admitting: Sports Medicine

## 2019-12-17 ENCOUNTER — Other Ambulatory Visit: Payer: Self-pay

## 2019-12-17 DIAGNOSIS — M79671 Pain in right foot: Secondary | ICD-10-CM

## 2019-12-17 DIAGNOSIS — M79674 Pain in right toe(s): Secondary | ICD-10-CM

## 2019-12-17 DIAGNOSIS — E1142 Type 2 diabetes mellitus with diabetic polyneuropathy: Secondary | ICD-10-CM | POA: Diagnosis not present

## 2019-12-17 DIAGNOSIS — L84 Corns and callosities: Secondary | ICD-10-CM

## 2019-12-17 DIAGNOSIS — L989 Disorder of the skin and subcutaneous tissue, unspecified: Secondary | ICD-10-CM | POA: Diagnosis not present

## 2019-12-17 DIAGNOSIS — M79675 Pain in left toe(s): Secondary | ICD-10-CM | POA: Diagnosis not present

## 2019-12-17 DIAGNOSIS — B351 Tinea unguium: Secondary | ICD-10-CM

## 2019-12-17 DIAGNOSIS — M79672 Pain in left foot: Secondary | ICD-10-CM

## 2019-12-17 NOTE — Progress Notes (Signed)
Subjective: Brittany Rojas is a 74 y.o. female patient with history of diabetes who presents to office today complaining of painful callus and nails; unable to trim. Reports callus on bottom of right foot  is tender and is glad to have it trimmed today.  Patient reports that her blood sugars are doing okay.  Patient denies any other issues at this time.  PCP Dr. Jacelyn Grip   Patient Active Problem List   Diagnosis Date Noted  . Coronary atherosclerosis of native coronary artery 12/08/2012  . DM (diabetes mellitus) ([place]) 02/28/2012  . Sleep apnea 10/29/2011  . NEUROPATHY 06/14/2008  . Hyperlipidemia 04/29/2007  . EXOGENOUS OBESITY 04/29/2007  . DEPRESSION 04/29/2007  . Essential hypertension 04/29/2007  . PERIPHERAL EDEMA 04/29/2007  . History of cardiovascular disorder 04/29/2007  . DIVERTICULITIS, HX OF 04/29/2007   Current Outpatient Medications on File Prior to Visit  Medication Sig Dispense Refill  . amLODipine (NORVASC) 2.5 MG tablet Take 2.5 mg by mouth daily.    Marland Kitchen aspirin 81 MG tablet Take 81 mg by mouth daily.    Marland Kitchen atorvastatin (LIPITOR) 80 MG tablet Take 80 mg by mouth daily.     . Bimatoprost (LUMIGAN OP) Place 1 drop into both eyes at bedtime.    . COMBIGAN 0.2-0.5 % ophthalmic solution Place 1 drop into both eyes 2 (two) times daily.  2  . Continuous Blood Gluc Sensor (FREESTYLE LIBRE 14 DAY SENSOR) MISC 1 each by Does not apply route every 14 (fourteen) days. 2 each 5  . diclofenac sodium (VOLTAREN) 1 % GEL APPLY 2 GRAMS TO AREA UP TO 4 TIMES DAILY    . gabapentin (NEURONTIN) 300 MG capsule TAKE 3 CAPSULES BY MOUTH AT BEDTIME 270 capsule 3  . hydrochlorothiazide (HYDRODIURIL) 25 MG tablet Take 25 mg by mouth daily.    . insulin NPH Human (HUMULIN N,NOVOLIN N) 100 UNIT/ML injection Inject 10 units into the skin each morning and 65 units into the skin each evening.    . insulin regular (NOVOLIN R,HUMULIN R) 100 units/mL injection Use as directed per scale three (3) times daily.     Marland Kitchen losartan (COZAAR) 100 MG tablet Take 100 mg by mouth daily.    . Multiple Vitamins-Minerals (MULTIVITAMIN PO) Take 1 tablet by mouth daily.    . nitroGLYCERIN (NITROSTAT) 0.4 MG SL tablet Place 1 tablet (0.4 mg total) under the tongue every 5 (five) minutes as needed for chest pain. 25 tablet 3  . NON FORMULARY Use CPAP machine each night.    . traMADol (ULTRAM) 50 MG tablet Take one (1) tablet (50 mg) by mouth every eight (8) to twelve (12) hours as needed for pain.  5  . [DISCONTINUED] telmisartan-hydrochlorothiazide (MICARDIS HCT) 80-12.5 MG per tablet Take 1 tablet by mouth daily.       No current facility-administered medications on file prior to visit.   Allergies  Allergen Reactions  . Erythromycin Other (See Comments)    "upset stomach" (02/17/2012)  . Penicillins Other (See Comments)    ">50 yr ago, I had strept throat; had gotten lots and lots of PCN; last dose caused me to stop breathing" (02/17/2012)  . Tetracycline Other (See Comments)    "upset stomach" (02/17/2012)  . Sulfonamide Derivatives Rash and Other (See Comments)    "upset stomach" (02/17/2012)    No results found for this or any previous visit (from the past 2160 hour(s)).  Objective: General: Patient is awake, alert, and oriented x 3 and in no acute  distress.  Integument: Skin is warm, dry and supple bilateral. Nails long and, thickened and dystrophic with subungual debris, consistent with onychomycosis, 1-5 bilateral. No signs of infection.  Callus right sub-met 2 and left medial bunion.  Remaining integument unremarkable.  Vasculature:  Dorsalis Pedis pulse 1/4 bilateral. Posterior Tibial pulse  1/4 bilateral. Capillary fill time <3 sec 1-5 bilateral.  Scant hair growth to the level of the digits.Temperature gradient within normal limits.  Moderate varicosities present bilateral. No edema present bilateral.   Neurology: The patient has intact sensation measured with a 5.07/10g Semmes Weinstein Monofilament at all  pedal sites bilateral . Vibratory sensation diminished bilateral with tuning fork. No Babinski sign present bilateral.   Musculoskeletal: Bunion and hammertoe with pes planus pedal deformities noted bilateral. Muscular strength 5/5 in all lower extremity muscular groups bilateral without pain on range of motion . No tenderness with calf compression bilateral.  Assessment and Plan: Problem List Items Addressed This Visit    None    Visit Diagnoses    Pain due to onychomycosis of toenails of both feet    -  Primary   Benign skin lesion       Callus of foot       Foot pain, bilateral       Diabetic polyneuropathy associated with type 2 diabetes mellitus ([place])         -Examined patient. -Re-discussed the importance of daily foot inspection setting diabetes -Mechanically debrided all nails using a sterile nail nipper and debrided callus x2 at right sub-met 2 and left medial bunion using sterile chisel blade and filed with dremel without incident -Advised good supportive shoes daily for foot type like previous -Patient to return in 62 days for nail and callus care  -Patient advised to call the office if any problems or questions arise in the meantime.  Landis Martins, DPM

## 2019-12-29 ENCOUNTER — Ambulatory Visit: Payer: Medicare Other | Admitting: Sports Medicine

## 2019-12-31 DIAGNOSIS — M1712 Unilateral primary osteoarthritis, left knee: Secondary | ICD-10-CM | POA: Diagnosis not present

## 2019-12-31 DIAGNOSIS — E782 Mixed hyperlipidemia: Secondary | ICD-10-CM | POA: Diagnosis not present

## 2019-12-31 DIAGNOSIS — E119 Type 2 diabetes mellitus without complications: Secondary | ICD-10-CM | POA: Diagnosis not present

## 2019-12-31 DIAGNOSIS — I10 Essential (primary) hypertension: Secondary | ICD-10-CM | POA: Diagnosis not present

## 2019-12-31 DIAGNOSIS — E1142 Type 2 diabetes mellitus with diabetic polyneuropathy: Secondary | ICD-10-CM | POA: Diagnosis not present

## 2019-12-31 DIAGNOSIS — I2510 Atherosclerotic heart disease of native coronary artery without angina pectoris: Secondary | ICD-10-CM | POA: Diagnosis not present

## 2019-12-31 DIAGNOSIS — E1165 Type 2 diabetes mellitus with hyperglycemia: Secondary | ICD-10-CM | POA: Diagnosis not present

## 2020-01-03 ENCOUNTER — Encounter: Payer: Self-pay | Admitting: Interventional Cardiology

## 2020-01-03 ENCOUNTER — Other Ambulatory Visit: Payer: Self-pay

## 2020-01-03 ENCOUNTER — Ambulatory Visit (INDEPENDENT_AMBULATORY_CARE_PROVIDER_SITE_OTHER): Payer: Medicare Other | Admitting: Interventional Cardiology

## 2020-01-03 ENCOUNTER — Encounter: Payer: Self-pay | Admitting: *Deleted

## 2020-01-03 VITALS — BP 120/52 | HR 61 | Ht 62.5 in | Wt 194.6 lb

## 2020-01-03 DIAGNOSIS — E785 Hyperlipidemia, unspecified: Secondary | ICD-10-CM

## 2020-01-03 DIAGNOSIS — E0829 Diabetes mellitus due to underlying condition with other diabetic kidney complication: Secondary | ICD-10-CM | POA: Diagnosis not present

## 2020-01-03 DIAGNOSIS — G4730 Sleep apnea, unspecified: Secondary | ICD-10-CM | POA: Diagnosis not present

## 2020-01-03 DIAGNOSIS — Z7189 Other specified counseling: Secondary | ICD-10-CM

## 2020-01-03 DIAGNOSIS — Z01818 Encounter for other preprocedural examination: Secondary | ICD-10-CM

## 2020-01-03 DIAGNOSIS — I2510 Atherosclerotic heart disease of native coronary artery without angina pectoris: Secondary | ICD-10-CM

## 2020-01-03 DIAGNOSIS — I10 Essential (primary) hypertension: Secondary | ICD-10-CM | POA: Diagnosis not present

## 2020-01-03 NOTE — Patient Instructions (Signed)
Medication Instructions:  Your physician recommends that you continue on your current medications as directed. Please refer to the Current Medication list given to you today.  *If you need a refill on your cardiac medications before your next appointment, please call your pharmacy*   Lab Work: None If you have labs (blood work) drawn today and your tests are completely normal, you will receive your results only by: . MyChart Message (if you have MyChart) OR . A paper copy in the mail If you have any lab test that is abnormal or we need to change your treatment, we will call you to review the results.   Testing/Procedures: Your physician has requested that you have a lexiscan myoview. For further information please visit [email or web address]. Please follow instruction sheet, as given.    Follow-Up: At [facility], you and your health needs are our priority.  As part of our continuing mission to provide you with exceptional heart care, we have created designated Provider Care Teams.  These Care Teams include your primary Cardiologist (physician) and Advanced Practice Providers (APPs -  Physician Assistants and Nurse Practitioners) who all work together to provide you with the care you need, when you need it.  We recommend signing up for the patient portal called "MyChart".  Sign up information is provided on this After Visit Summary.  MyChart is used to connect with patients for Virtual Visits (Telemedicine).  Patients are able to view lab/test results, encounter notes, upcoming appointments, etc.  Non-urgent messages can be sent to your provider as well.   To learn more about what you can do with MyChart, go to [email or web address].    Your next appointment:   12 month(s)  The format for your next appointment:   In Person  Provider:   You may see Henry W Smith III, MD or one of the following Advanced Practice Providers on your designated Care Team:    Lori Gerhardt, NP  Laura  Ingold, NP  Jill McDaniel, NP    Other Instructions   

## 2020-01-03 NOTE — Progress Notes (Signed)
Cardiology Office Note:    Date:  01/03/2020   ID:  Brittany Rojas, DOB [birth year], MRN [id number]  PCP:  [name], MD  Cardiologist:  Sinclair Grooms, MD   Referring MD: [name], MD   Chief Complaint  Patient presents with  . Coronary Artery Disease  . Advice Only    Preop cardiac clearance    History of Present Illness:    Brittany Rojas is a 74 y.o. female with a hx of  a hx of obtuse marginal #1 DES 2014, hypertension, obstructive sleep apnea, and hyperlipidemia.  The patient looks forward to an upcoming left knee replacement surgery at [facility].  She has not been able to ambulate very much because of severe pain.  She denies angina.  She underwent obtuse marginal stenting in 2014.  She has done well since that time having no recurrent ischemic events, angina, or need for restudy.  Specifically at this time she denies orthopnea, PND, lower extremity swelling, chest pain, palpitations, and syncope.  Past Medical History:  Diagnosis Date  . Bronchitis    "touch q now and then" (02/17/2012)  . Depression   . Diverticulitis   . History of blood transfusion 1945   "when I was 63 days old" (02/17/2012)  . History of transient ischemic attack (TIA) 2001   "possibly; never really pinned down what it was" (02/17/2012)  . Hyperlipidemia   . Hypertension   . Migraines    "outgrew them I guess" (02/17/2012)  . OSA on CPAP   . Pneumonia 12/2010  . Shortness of breath    "occasionally; at any time" (02/17/2012)  . Type II diabetes mellitus ([place])     Past Surgical History:  Procedure Laterality Date  . ABDOMINAL HYSTERECTOMY  2000's  . APPENDECTOMY  04/1988  . CARDIAC CATHETERIZATION  02/13/2012  . COLECTOMY  01/18/1988   "took 3 feet out; result of lots of burst diverticula" (02/17/2012)  . COLECTOMY  04/1988   "9 more inches of colon" (02/17/2012)  . COLOSTOMY  01/18/1988  . COLOSTOMY REVERSAL  04/1988  . CORONARY ANGIOPLASTY WITH  STENT PLACEMENT  02/17/2012   "1; first one ever" (02/17/2012)  . LEFT OOPHORECTOMY  1989  . PERCUTANEOUS CORONARY STENT INTERVENTION (PCI-S) N/A 02/17/2012   Procedure: PERCUTANEOUS CORONARY STENT INTERVENTION (PCI-S);  Surgeon: Sinclair Grooms, MD;  Location: [facility] CATH LAB;  Service: Cardiovascular;  Laterality: N/A;    Current Medications: Current Meds  Medication Sig  . amLODipine (NORVASC) 5 MG tablet Take 5 mg by mouth daily.  Marland Kitchen aspirin 81 MG tablet Take 81 mg by mouth daily.  Marland Kitchen atorvastatin (LIPITOR) 80 MG tablet Take 80 mg by mouth daily.   . Bimatoprost (LUMIGAN OP) Place 1 drop into both eyes at bedtime.  . COMBIGAN 0.2-0.5 % ophthalmic solution Place 1 drop into both eyes 2 (two) times daily.  . Continuous Blood Gluc Sensor (FREESTYLE LIBRE 14 DAY SENSOR) MISC 1 each by Does not apply route every 14 (fourteen) days.  . diclofenac sodium (VOLTAREN) 1 % GEL APPLY 2 GRAMS TO AREA UP TO 4 TIMES DAILY  . gabapentin (NEURONTIN) 300 MG capsule TAKE 3 CAPSULES BY MOUTH AT BEDTIME  . hydrochlorothiazide (HYDRODIURIL) 25 MG tablet Take 25 mg by mouth daily.  . insulin NPH Human (HUMULIN N,NOVOLIN N) 100 UNIT/ML injection Inject 10 units into the skin each morning and 65 units into the skin each evening.  . insulin  regular (NOVOLIN R,HUMULIN R) 100 units/mL injection Use as directed per scale three (3) times daily.  Marland Kitchen losartan (COZAAR) 100 MG tablet Take 100 mg by mouth daily.  . Multiple Vitamins-Minerals (MULTIVITAMIN PO) Take 1 tablet by mouth daily.  . nitroGLYCERIN (NITROSTAT) 0.4 MG SL tablet Place 1 tablet (0.4 mg total) under the tongue every 5 (five) minutes as needed for chest pain.  . NON FORMULARY Use CPAP machine each night.  . traMADol (ULTRAM) 50 MG tablet Take one (1) tablet (50 mg) by mouth every eight (8) to twelve (12) hours as needed for pain.     Allergies:   Erythromycin, Penicillins, Tetracycline, and Sulfonamide derivatives   Social History   Socioeconomic History    . Marital status: Married    Spouse name: Not on file  . Number of children: Not on file  . Years of education: Not on file  . Highest education level: Not on file  Occupational History  . Not on file  Tobacco Use  . Smoking status: Former Smoker    Years: 2.00    Types: Cigarettes    Quit date: 11/11/1969    Years since quitting: 50.1  . Smokeless tobacco: Never Used  Vaping Use  . Vaping Use: Never used  Substance and Sexual Activity  . Alcohol use: No  . Drug use: No  . Sexual activity: Not Currently  Other Topics Concern  . Not on file  Social History Narrative  . Not on file   Social Determinants of Health   Financial Resource Strain:   . Difficulty of Paying Living Expenses: Not on file  Food Insecurity:   . Worried About Charity fundraiser in the Last Year: Not on file  . Ran Out of Food in the Last Year: Not on file  Transportation Needs:   . Lack of Transportation (Medical): Not on file  . Lack of Transportation (Non-Medical): Not on file  Physical Activity:   . Days of Exercise per Week: Not on file  . Minutes of Exercise per Session: Not on file  Stress:   . Feeling of Stress : Not on file  Social Connections:   . Frequency of Communication with Friends and Family: Not on file  . Frequency of Social Gatherings with Friends and Family: Not on file  . Attends Religious Services: Not on file  . Active Member of Clubs or Organizations: Not on file  . Attends Archivist Meetings: Not on file  . Marital Status: Not on file     Family History: The patient's family history includes Angina in her mother; Diabetes in her mother; Lung cancer in her father; Other in her sister.  ROS:   Please see the history of present illness.    Basal cell carcinoma medial aspect of right ankle.  Left and right knee pain.  All other systems reviewed and are negative.  EKGs/Labs/Other Studies Reviewed:    The following studies were reviewed today: No imaging or  cardiac evaluation since prior to stent implantation  EKG:  EKG from 12/07/2019 reveals sinus rhythm, right bundle branch block, PAC, and otherwise unchanged when compared to 12/21/2018  Recent Labs: No results found for requested labs within last 8760 hours.  Recent Lipid Panel No results found for: CHOL, TRIG, HDL, CHOLHDL, VLDL, LDLCALC, LDLDIRECT  Physical Exam:    VS:  BP (!) 120/52   Pulse 61   Ht 5' 2.5" (1.588 m)   Wt 194 lb 9.6 oz (88.3  kg)   SpO2 99%   BMI 35.03 kg/m     Wt Readings from Last 3 Encounters:  01/03/20 194 lb 9.6 oz (88.3 kg)  12/18/18 195 lb 1.9 oz (88.5 kg)  11/18/17 209 lb 3.2 oz (94.9 kg)     GEN: Obese and limited mobility.. No acute distress HEENT: Normal NECK: No JVD.  Faint left carotid bruit LYMPHATICS: No lymphadenopathy CARDIAC:  RRR without murmur, gallop, or edema. VASCULAR:  Normal Pulses. No bruits. RESPIRATORY:  Clear to auscultation without rales, wheezing or rhonchi  ABDOMEN: Soft, non-tender, non-distended, No pulsatile mass, MUSCULOSKELETAL: No deformity  SKIN: Warm and dry.  Right ankle healing incision from resection of basal cell carcinoma NEUROLOGIC:  Alert and oriented x 3 PSYCHIATRIC:  Normal affect   ASSESSMENT:    1. Atherosclerosis of native coronary artery of native heart without angina pectoris   2. Hyperlipidemia, unspecified hyperlipidemia type   3. Essential hypertension   4. Diabetes mellitus due to underlying condition with other kidney complication, unspecified whether long term insulin use ([place])   5. Primary hypertension   6. Sleep apnea, unspecified type   7. Educated about COVID-19 virus infection   8. Pre-op evaluation    PLAN:    In order of problems listed above:  1. Asymptomatic relative to ischemic heart disease.  Secondary prevention discussed and metrics for prevention were reviewed.  She is unable to exercise. 2. Continue Lipitor 80 mg/day.  Most recent LDL was 93 and 2020.  This is followed  by her primary physician and [place]. 3. Excellent blood pressure control. 4. Most recent hemoglobin A1c was 6.8.  Continue insulin 5. Continue amlodipine 2.5 mg/day, hydrochlorothiazide 25 mg/day, and Cozaar 100 mg/day. 6. She uses CPAP with good results. 7. Covid vaccinated and practicing social distancing. 8. A Lexiscan myocardial perfusion study will be done to establish clearance for upcoming knee replacement surgery.  Lung she does not have a high risk scan, she will be granted clearance.   Overall education and awareness concerning primary/secondary risk prevention was discussed in detail: LDL less than 70, hemoglobin A1c less than 7, blood pressure target less than 130/80 mmHg, >150 minutes of moderate aerobic activity per week, avoidance of smoking, weight control (via diet and exercise), and continued surveillance/management of/for obstructive sleep apnea.    Medication Adjustments/Labs and Tests Ordered: Current medicines are reviewed at length with the patient today.  Concerns regarding medicines are outlined above.  Orders Placed This Encounter  Procedures  . MYOCARDIAL PERFUSION IMAGING  . EKG 12-Lead   No orders of the defined types were placed in this encounter.   Patient Instructions  Medication Instructions:  Your physician recommends that you continue on your current medications as directed. Please refer to the Current Medication list given to you today.  *If you need a refill on your cardiac medications before your next appointment, please call your pharmacy*   Lab Work: None If you have labs (blood work) drawn today and your tests are completely normal, you will receive your results only by: Marland Kitchen MyChart Message (if you have MyChart) OR . A paper copy in the mail If you have any lab test that is abnormal or we need to change your treatment, we will call you to review the results.   Testing/Procedures: Your physician has requested that  you have a lexiscan myoview. For further information please visit [email or web address]. Please follow instruction sheet, as given.   Follow-Up: At [facility], you and your  health needs are our priority.  As part of our continuing mission to provide you with exceptional heart care, we have created designated Provider Care Teams.  These Care Teams include your primary Cardiologist (physician) and Advanced Practice Providers (APPs -  Physician Assistants and Nurse Practitioners) who all work together to provide you with the care you need, when you need it.  We recommend signing up for the patient portal called "MyChart".  Sign up information is provided on this After Visit Summary.  MyChart is used to connect with patients for Virtual Visits (Telemedicine).  Patients are able to view lab/test results, encounter notes, upcoming appointments, etc.  Non-urgent messages can be sent to your provider as well.   To learn more about what you can do with MyChart, go to [email or web address].    Your next appointment:   12 month(s)  The format for your next appointment:   In Person  Provider:   You may see Sinclair Grooms, MD or one of the following Advanced Practice Providers on your designated Care Team:    Truitt Merle, NP  Cecilie Kicks, NP  Kathyrn Drown, NP    Other Instructions      Signed, Sinclair Grooms, MD  01/03/2020 1:53 PM    [place]

## 2020-01-10 ENCOUNTER — Telehealth (HOSPITAL_COMMUNITY): Payer: Self-pay | Admitting: *Deleted

## 2020-01-10 NOTE — Telephone Encounter (Signed)
Left message on voicemail per DPR in reference to upcoming appointment scheduled on 01/14/20 at 7:45 with detailed instructions given per Myocardial Perfusion Study Information Sheet for the test. LM to arrive 15 minutes early, and that it is imperative to arrive on time for appointment to keep from having the test rescheduled. If you need to cancel or reschedule your appointment, please call the office within 24 hours of your appointment. Failure to do so may result in a cancellation of your appointment, and a $50 no show fee. Phone number given for call back for any questions.

## 2020-01-14 ENCOUNTER — Ambulatory Visit (HOSPITAL_COMMUNITY): Payer: Medicare Other | Attending: Cardiovascular Disease

## 2020-01-14 ENCOUNTER — Other Ambulatory Visit: Payer: Self-pay

## 2020-01-14 DIAGNOSIS — I2510 Atherosclerotic heart disease of native coronary artery without angina pectoris: Secondary | ICD-10-CM | POA: Diagnosis not present

## 2020-01-14 DIAGNOSIS — Z01818 Encounter for other preprocedural examination: Secondary | ICD-10-CM | POA: Diagnosis not present

## 2020-01-14 LAB — MYOCARDIAL PERFUSION IMAGING
LV dias vol: 49 mL (ref 46–106)
LV sys vol: 10 mL
Peak HR: 83 {beats}/min
Rest HR: 66 {beats}/min
SDS: 2
SRS: 0
SSS: 2
TID: 0.85

## 2020-01-14 MED ORDER — TECHNETIUM TC 99M TETROFOSMIN IV KIT
31.9000 | PACK | Freq: Once | INTRAVENOUS | Status: AC | PRN
  Administered 2020-01-14: 31.9 via INTRAVENOUS
  Filled 2020-01-14: qty 32

## 2020-01-14 MED ORDER — TECHNETIUM TC 99M TETROFOSMIN IV KIT
10.7000 | PACK | Freq: Once | INTRAVENOUS | Status: AC | PRN
  Administered 2020-01-14: 10.7 via INTRAVENOUS
  Filled 2020-01-14: qty 11

## 2020-01-14 MED ORDER — REGADENOSON 0.4 MG/5ML IV SOLN
0.4000 mg | Freq: Once | INTRAVENOUS | Status: AC
  Administered 2020-01-14: 0.4 mg via INTRAVENOUS

## 2020-01-26 DIAGNOSIS — E1142 Type 2 diabetes mellitus with diabetic polyneuropathy: Secondary | ICD-10-CM | POA: Diagnosis not present

## 2020-01-26 DIAGNOSIS — E1165 Type 2 diabetes mellitus with hyperglycemia: Secondary | ICD-10-CM | POA: Diagnosis not present

## 2020-01-26 DIAGNOSIS — M1712 Unilateral primary osteoarthritis, left knee: Secondary | ICD-10-CM | POA: Diagnosis not present

## 2020-01-26 DIAGNOSIS — I10 Essential (primary) hypertension: Secondary | ICD-10-CM | POA: Diagnosis not present

## 2020-01-26 DIAGNOSIS — E782 Mixed hyperlipidemia: Secondary | ICD-10-CM | POA: Diagnosis not present

## 2020-01-26 DIAGNOSIS — I2510 Atherosclerotic heart disease of native coronary artery without angina pectoris: Secondary | ICD-10-CM | POA: Diagnosis not present

## 2020-01-26 DIAGNOSIS — E119 Type 2 diabetes mellitus without complications: Secondary | ICD-10-CM | POA: Diagnosis not present

## 2020-01-31 NOTE — Telephone Encounter (Signed)
Ivin Booty with [facility] is calling to follow up on status of clearance.

## 2020-02-01 DIAGNOSIS — H401131 Primary open-angle glaucoma, bilateral, mild stage: Secondary | ICD-10-CM | POA: Diagnosis not present

## 2020-02-01 NOTE — Telephone Encounter (Signed)
   Primary Cardiologist: Sinclair Grooms, MD  Chart reviewed as part of pre-operative protocol coverage. Given past medical history and time since last visit, based on ACC/AHA guidelines, ICEIS KNAB would be at acceptable risk for the planned procedure without further cardiovascular testing.   Her aspirin may be held for 5 days prior to her surgery.  Please resume as soon as hemostasis is achieved.  I will route this recommendation to the requesting party via Epic fax function and remove from pre-op pool.  Please call with questions.  Jossie Ng. Obrien Huskins NP-C    02/01/2020, 6:44 AM [place] [address] Office [phone] Fax [phone]

## 2020-02-07 DIAGNOSIS — Z79899 Other long term (current) drug therapy: Secondary | ICD-10-CM | POA: Diagnosis not present

## 2020-02-07 DIAGNOSIS — R52 Pain, unspecified: Secondary | ICD-10-CM | POA: Diagnosis not present

## 2020-02-07 DIAGNOSIS — M79609 Pain in unspecified limb: Secondary | ICD-10-CM | POA: Diagnosis not present

## 2020-02-07 DIAGNOSIS — Z1159 Encounter for screening for other viral diseases: Secondary | ICD-10-CM | POA: Diagnosis not present

## 2020-02-07 DIAGNOSIS — Z1152 Encounter for screening for COVID-19: Secondary | ICD-10-CM | POA: Diagnosis not present

## 2020-02-09 DIAGNOSIS — I10 Essential (primary) hypertension: Secondary | ICD-10-CM | POA: Diagnosis not present

## 2020-02-09 DIAGNOSIS — E1140 Type 2 diabetes mellitus with diabetic neuropathy, unspecified: Secondary | ICD-10-CM | POA: Diagnosis not present

## 2020-02-09 DIAGNOSIS — E7800 Pure hypercholesterolemia, unspecified: Secondary | ICD-10-CM | POA: Diagnosis not present

## 2020-02-09 DIAGNOSIS — I2510 Atherosclerotic heart disease of native coronary artery without angina pectoris: Secondary | ICD-10-CM | POA: Diagnosis not present

## 2020-02-09 DIAGNOSIS — E1165 Type 2 diabetes mellitus with hyperglycemia: Secondary | ICD-10-CM | POA: Diagnosis not present

## 2020-02-10 DIAGNOSIS — H401131 Primary open-angle glaucoma, bilateral, mild stage: Secondary | ICD-10-CM | POA: Diagnosis not present

## 2020-02-14 DIAGNOSIS — Z7982 Long term (current) use of aspirin: Secondary | ICD-10-CM | POA: Diagnosis not present

## 2020-02-14 DIAGNOSIS — J45909 Unspecified asthma, uncomplicated: Secondary | ICD-10-CM | POA: Diagnosis not present

## 2020-02-14 DIAGNOSIS — Z471 Aftercare following joint replacement surgery: Secondary | ICD-10-CM | POA: Diagnosis not present

## 2020-02-14 DIAGNOSIS — Z96642 Presence of left artificial hip joint: Secondary | ICD-10-CM | POA: Diagnosis not present

## 2020-02-14 DIAGNOSIS — Z9889 Other specified postprocedural states: Secondary | ICD-10-CM | POA: Diagnosis not present

## 2020-02-14 DIAGNOSIS — Z794 Long term (current) use of insulin: Secondary | ICD-10-CM | POA: Diagnosis not present

## 2020-02-14 DIAGNOSIS — G8918 Other acute postprocedural pain: Secondary | ICD-10-CM | POA: Diagnosis not present

## 2020-02-14 DIAGNOSIS — Z96652 Presence of left artificial knee joint: Secondary | ICD-10-CM | POA: Diagnosis not present

## 2020-02-14 DIAGNOSIS — M1712 Unilateral primary osteoarthritis, left knee: Secondary | ICD-10-CM | POA: Diagnosis not present

## 2020-02-14 DIAGNOSIS — E669 Obesity, unspecified: Secondary | ICD-10-CM | POA: Diagnosis not present

## 2020-02-14 DIAGNOSIS — Z9989 Dependence on other enabling machines and devices: Secondary | ICD-10-CM | POA: Diagnosis not present

## 2020-02-14 DIAGNOSIS — G4733 Obstructive sleep apnea (adult) (pediatric): Secondary | ICD-10-CM | POA: Diagnosis not present

## 2020-02-14 DIAGNOSIS — I252 Old myocardial infarction: Secondary | ICD-10-CM | POA: Diagnosis not present

## 2020-02-14 DIAGNOSIS — Z87891 Personal history of nicotine dependence: Secondary | ICD-10-CM | POA: Diagnosis not present

## 2020-02-14 DIAGNOSIS — Z79899 Other long term (current) drug therapy: Secondary | ICD-10-CM | POA: Diagnosis not present

## 2020-02-14 DIAGNOSIS — I10 Essential (primary) hypertension: Secondary | ICD-10-CM | POA: Diagnosis not present

## 2020-02-15 DIAGNOSIS — I10 Essential (primary) hypertension: Secondary | ICD-10-CM | POA: Diagnosis not present

## 2020-02-15 DIAGNOSIS — J45909 Unspecified asthma, uncomplicated: Secondary | ICD-10-CM | POA: Diagnosis not present

## 2020-02-15 DIAGNOSIS — I252 Old myocardial infarction: Secondary | ICD-10-CM | POA: Diagnosis not present

## 2020-02-15 DIAGNOSIS — Z87891 Personal history of nicotine dependence: Secondary | ICD-10-CM | POA: Diagnosis not present

## 2020-02-15 DIAGNOSIS — M1712 Unilateral primary osteoarthritis, left knee: Secondary | ICD-10-CM | POA: Diagnosis not present

## 2020-02-15 DIAGNOSIS — Z9989 Dependence on other enabling machines and devices: Secondary | ICD-10-CM | POA: Diagnosis not present

## 2020-02-16 DIAGNOSIS — J45909 Unspecified asthma, uncomplicated: Secondary | ICD-10-CM | POA: Diagnosis not present

## 2020-02-16 DIAGNOSIS — G4733 Obstructive sleep apnea (adult) (pediatric): Secondary | ICD-10-CM | POA: Diagnosis not present

## 2020-02-16 DIAGNOSIS — Z471 Aftercare following joint replacement surgery: Secondary | ICD-10-CM | POA: Diagnosis not present

## 2020-02-16 DIAGNOSIS — Z791 Long term (current) use of non-steroidal anti-inflammatories (NSAID): Secondary | ICD-10-CM | POA: Diagnosis not present

## 2020-02-16 DIAGNOSIS — I10 Essential (primary) hypertension: Secondary | ICD-10-CM | POA: Diagnosis not present

## 2020-02-16 DIAGNOSIS — Z96652 Presence of left artificial knee joint: Secondary | ICD-10-CM | POA: Diagnosis not present

## 2020-02-16 DIAGNOSIS — Z87891 Personal history of nicotine dependence: Secondary | ICD-10-CM | POA: Diagnosis not present

## 2020-02-16 DIAGNOSIS — Z79891 Long term (current) use of opiate analgesic: Secondary | ICD-10-CM | POA: Diagnosis not present

## 2020-02-16 DIAGNOSIS — Z79899 Other long term (current) drug therapy: Secondary | ICD-10-CM | POA: Diagnosis not present

## 2020-02-16 DIAGNOSIS — E669 Obesity, unspecified: Secondary | ICD-10-CM | POA: Diagnosis not present

## 2020-02-16 DIAGNOSIS — Z9989 Dependence on other enabling machines and devices: Secondary | ICD-10-CM | POA: Diagnosis not present

## 2020-02-16 DIAGNOSIS — Z7982 Long term (current) use of aspirin: Secondary | ICD-10-CM | POA: Diagnosis not present

## 2020-02-16 DIAGNOSIS — Z794 Long term (current) use of insulin: Secondary | ICD-10-CM | POA: Diagnosis not present

## 2020-02-16 DIAGNOSIS — I252 Old myocardial infarction: Secondary | ICD-10-CM | POA: Diagnosis not present

## 2020-02-17 DIAGNOSIS — Z96652 Presence of left artificial knee joint: Secondary | ICD-10-CM | POA: Diagnosis not present

## 2020-02-17 DIAGNOSIS — Z471 Aftercare following joint replacement surgery: Secondary | ICD-10-CM | POA: Diagnosis not present

## 2020-02-17 DIAGNOSIS — I10 Essential (primary) hypertension: Secondary | ICD-10-CM | POA: Diagnosis not present

## 2020-02-17 DIAGNOSIS — J45909 Unspecified asthma, uncomplicated: Secondary | ICD-10-CM | POA: Diagnosis not present

## 2020-02-17 DIAGNOSIS — E669 Obesity, unspecified: Secondary | ICD-10-CM | POA: Diagnosis not present

## 2020-02-17 DIAGNOSIS — G4733 Obstructive sleep apnea (adult) (pediatric): Secondary | ICD-10-CM | POA: Diagnosis not present

## 2020-02-18 ENCOUNTER — Ambulatory Visit: Payer: Medicare Other | Admitting: Sports Medicine

## 2020-02-18 DIAGNOSIS — G4733 Obstructive sleep apnea (adult) (pediatric): Secondary | ICD-10-CM | POA: Diagnosis not present

## 2020-02-18 DIAGNOSIS — Z96652 Presence of left artificial knee joint: Secondary | ICD-10-CM | POA: Diagnosis not present

## 2020-02-18 DIAGNOSIS — I10 Essential (primary) hypertension: Secondary | ICD-10-CM | POA: Diagnosis not present

## 2020-02-18 DIAGNOSIS — Z471 Aftercare following joint replacement surgery: Secondary | ICD-10-CM | POA: Diagnosis not present

## 2020-02-18 DIAGNOSIS — E669 Obesity, unspecified: Secondary | ICD-10-CM | POA: Diagnosis not present

## 2020-02-18 DIAGNOSIS — J45909 Unspecified asthma, uncomplicated: Secondary | ICD-10-CM | POA: Diagnosis not present

## 2020-02-21 DIAGNOSIS — G4733 Obstructive sleep apnea (adult) (pediatric): Secondary | ICD-10-CM | POA: Diagnosis not present

## 2020-02-21 DIAGNOSIS — J45909 Unspecified asthma, uncomplicated: Secondary | ICD-10-CM | POA: Diagnosis not present

## 2020-02-21 DIAGNOSIS — I10 Essential (primary) hypertension: Secondary | ICD-10-CM | POA: Diagnosis not present

## 2020-02-21 DIAGNOSIS — E669 Obesity, unspecified: Secondary | ICD-10-CM | POA: Diagnosis not present

## 2020-02-21 DIAGNOSIS — Z96652 Presence of left artificial knee joint: Secondary | ICD-10-CM | POA: Diagnosis not present

## 2020-02-21 DIAGNOSIS — Z471 Aftercare following joint replacement surgery: Secondary | ICD-10-CM | POA: Diagnosis not present

## 2020-02-23 DIAGNOSIS — Z471 Aftercare following joint replacement surgery: Secondary | ICD-10-CM | POA: Diagnosis not present

## 2020-02-23 DIAGNOSIS — J45909 Unspecified asthma, uncomplicated: Secondary | ICD-10-CM | POA: Diagnosis not present

## 2020-02-23 DIAGNOSIS — G4733 Obstructive sleep apnea (adult) (pediatric): Secondary | ICD-10-CM | POA: Diagnosis not present

## 2020-02-23 DIAGNOSIS — Z96652 Presence of left artificial knee joint: Secondary | ICD-10-CM | POA: Diagnosis not present

## 2020-02-23 DIAGNOSIS — E669 Obesity, unspecified: Secondary | ICD-10-CM | POA: Diagnosis not present

## 2020-02-23 DIAGNOSIS — I10 Essential (primary) hypertension: Secondary | ICD-10-CM | POA: Diagnosis not present

## 2020-02-25 DIAGNOSIS — J45909 Unspecified asthma, uncomplicated: Secondary | ICD-10-CM | POA: Diagnosis not present

## 2020-02-25 DIAGNOSIS — Z471 Aftercare following joint replacement surgery: Secondary | ICD-10-CM | POA: Diagnosis not present

## 2020-02-25 DIAGNOSIS — E669 Obesity, unspecified: Secondary | ICD-10-CM | POA: Diagnosis not present

## 2020-02-25 DIAGNOSIS — I10 Essential (primary) hypertension: Secondary | ICD-10-CM | POA: Diagnosis not present

## 2020-02-25 DIAGNOSIS — G4733 Obstructive sleep apnea (adult) (pediatric): Secondary | ICD-10-CM | POA: Diagnosis not present

## 2020-02-25 DIAGNOSIS — Z96652 Presence of left artificial knee joint: Secondary | ICD-10-CM | POA: Diagnosis not present

## 2020-02-29 DIAGNOSIS — I10 Essential (primary) hypertension: Secondary | ICD-10-CM | POA: Diagnosis not present

## 2020-02-29 DIAGNOSIS — G4733 Obstructive sleep apnea (adult) (pediatric): Secondary | ICD-10-CM | POA: Diagnosis not present

## 2020-02-29 DIAGNOSIS — Z96652 Presence of left artificial knee joint: Secondary | ICD-10-CM | POA: Diagnosis not present

## 2020-02-29 DIAGNOSIS — E669 Obesity, unspecified: Secondary | ICD-10-CM | POA: Diagnosis not present

## 2020-02-29 DIAGNOSIS — J45909 Unspecified asthma, uncomplicated: Secondary | ICD-10-CM | POA: Diagnosis not present

## 2020-02-29 DIAGNOSIS — Z471 Aftercare following joint replacement surgery: Secondary | ICD-10-CM | POA: Diagnosis not present

## 2020-03-01 DIAGNOSIS — M25662 Stiffness of left knee, not elsewhere classified: Secondary | ICD-10-CM | POA: Diagnosis not present

## 2020-03-01 DIAGNOSIS — M25562 Pain in left knee: Secondary | ICD-10-CM | POA: Diagnosis not present

## 2020-03-01 DIAGNOSIS — M6281 Muscle weakness (generalized): Secondary | ICD-10-CM | POA: Diagnosis not present

## 2020-03-03 DIAGNOSIS — M6281 Muscle weakness (generalized): Secondary | ICD-10-CM | POA: Diagnosis not present

## 2020-03-03 DIAGNOSIS — M25562 Pain in left knee: Secondary | ICD-10-CM | POA: Diagnosis not present

## 2020-03-03 DIAGNOSIS — M25662 Stiffness of left knee, not elsewhere classified: Secondary | ICD-10-CM | POA: Diagnosis not present

## 2020-03-07 DIAGNOSIS — E782 Mixed hyperlipidemia: Secondary | ICD-10-CM | POA: Diagnosis not present

## 2020-03-07 DIAGNOSIS — E1165 Type 2 diabetes mellitus with hyperglycemia: Secondary | ICD-10-CM | POA: Diagnosis not present

## 2020-03-07 DIAGNOSIS — I2510 Atherosclerotic heart disease of native coronary artery without angina pectoris: Secondary | ICD-10-CM | POA: Diagnosis not present

## 2020-03-07 DIAGNOSIS — E1142 Type 2 diabetes mellitus with diabetic polyneuropathy: Secondary | ICD-10-CM | POA: Diagnosis not present

## 2020-03-07 DIAGNOSIS — M1712 Unilateral primary osteoarthritis, left knee: Secondary | ICD-10-CM | POA: Diagnosis not present

## 2020-03-07 DIAGNOSIS — I10 Essential (primary) hypertension: Secondary | ICD-10-CM | POA: Diagnosis not present

## 2020-03-08 DIAGNOSIS — M6281 Muscle weakness (generalized): Secondary | ICD-10-CM | POA: Diagnosis not present

## 2020-03-08 DIAGNOSIS — M25662 Stiffness of left knee, not elsewhere classified: Secondary | ICD-10-CM | POA: Diagnosis not present

## 2020-03-08 DIAGNOSIS — M25562 Pain in left knee: Secondary | ICD-10-CM | POA: Diagnosis not present

## 2020-03-10 DIAGNOSIS — M25662 Stiffness of left knee, not elsewhere classified: Secondary | ICD-10-CM | POA: Diagnosis not present

## 2020-03-10 DIAGNOSIS — M25562 Pain in left knee: Secondary | ICD-10-CM | POA: Diagnosis not present

## 2020-03-10 DIAGNOSIS — M6281 Muscle weakness (generalized): Secondary | ICD-10-CM | POA: Diagnosis not present

## 2020-03-15 DIAGNOSIS — M25562 Pain in left knee: Secondary | ICD-10-CM | POA: Diagnosis not present

## 2020-03-15 DIAGNOSIS — M25662 Stiffness of left knee, not elsewhere classified: Secondary | ICD-10-CM | POA: Diagnosis not present

## 2020-03-15 DIAGNOSIS — M6281 Muscle weakness (generalized): Secondary | ICD-10-CM | POA: Diagnosis not present

## 2020-03-17 DIAGNOSIS — M25662 Stiffness of left knee, not elsewhere classified: Secondary | ICD-10-CM | POA: Diagnosis not present

## 2020-03-17 DIAGNOSIS — M6281 Muscle weakness (generalized): Secondary | ICD-10-CM | POA: Diagnosis not present

## 2020-03-17 DIAGNOSIS — M25562 Pain in left knee: Secondary | ICD-10-CM | POA: Diagnosis not present

## 2020-03-22 DIAGNOSIS — M6281 Muscle weakness (generalized): Secondary | ICD-10-CM | POA: Diagnosis not present

## 2020-03-22 DIAGNOSIS — M25662 Stiffness of left knee, not elsewhere classified: Secondary | ICD-10-CM | POA: Diagnosis not present

## 2020-03-22 DIAGNOSIS — M25562 Pain in left knee: Secondary | ICD-10-CM | POA: Diagnosis not present

## 2020-03-24 DIAGNOSIS — M25662 Stiffness of left knee, not elsewhere classified: Secondary | ICD-10-CM | POA: Diagnosis not present

## 2020-03-24 DIAGNOSIS — M25562 Pain in left knee: Secondary | ICD-10-CM | POA: Diagnosis not present

## 2020-03-24 DIAGNOSIS — M6281 Muscle weakness (generalized): Secondary | ICD-10-CM | POA: Diagnosis not present

## 2020-03-29 DIAGNOSIS — E1142 Type 2 diabetes mellitus with diabetic polyneuropathy: Secondary | ICD-10-CM | POA: Diagnosis not present

## 2020-03-29 DIAGNOSIS — M25662 Stiffness of left knee, not elsewhere classified: Secondary | ICD-10-CM | POA: Diagnosis not present

## 2020-03-29 DIAGNOSIS — E1165 Type 2 diabetes mellitus with hyperglycemia: Secondary | ICD-10-CM | POA: Diagnosis not present

## 2020-03-29 DIAGNOSIS — M6281 Muscle weakness (generalized): Secondary | ICD-10-CM | POA: Diagnosis not present

## 2020-03-29 DIAGNOSIS — I10 Essential (primary) hypertension: Secondary | ICD-10-CM | POA: Diagnosis not present

## 2020-03-29 DIAGNOSIS — M25562 Pain in left knee: Secondary | ICD-10-CM | POA: Diagnosis not present

## 2020-03-29 DIAGNOSIS — M1712 Unilateral primary osteoarthritis, left knee: Secondary | ICD-10-CM | POA: Diagnosis not present

## 2020-03-29 DIAGNOSIS — I2510 Atherosclerotic heart disease of native coronary artery without angina pectoris: Secondary | ICD-10-CM | POA: Diagnosis not present

## 2020-03-29 DIAGNOSIS — E782 Mixed hyperlipidemia: Secondary | ICD-10-CM | POA: Diagnosis not present

## 2020-03-30 ENCOUNTER — Encounter: Payer: Self-pay | Admitting: Podiatry

## 2020-03-30 ENCOUNTER — Ambulatory Visit (INDEPENDENT_AMBULATORY_CARE_PROVIDER_SITE_OTHER): Payer: Medicare Other | Admitting: Podiatry

## 2020-03-30 ENCOUNTER — Other Ambulatory Visit: Payer: Self-pay

## 2020-03-30 DIAGNOSIS — E1142 Type 2 diabetes mellitus with diabetic polyneuropathy: Secondary | ICD-10-CM

## 2020-03-30 DIAGNOSIS — M2142 Flat foot [pes planus] (acquired), left foot: Secondary | ICD-10-CM | POA: Diagnosis not present

## 2020-03-30 DIAGNOSIS — E7800 Pure hypercholesterolemia, unspecified: Secondary | ICD-10-CM | POA: Insufficient documentation

## 2020-03-30 DIAGNOSIS — L84 Corns and callosities: Secondary | ICD-10-CM

## 2020-03-30 DIAGNOSIS — M2041 Other hammer toe(s) (acquired), right foot: Secondary | ICD-10-CM | POA: Diagnosis not present

## 2020-03-30 DIAGNOSIS — M2042 Other hammer toe(s) (acquired), left foot: Secondary | ICD-10-CM

## 2020-03-30 DIAGNOSIS — M2141 Flat foot [pes planus] (acquired), right foot: Secondary | ICD-10-CM | POA: Diagnosis not present

## 2020-03-30 DIAGNOSIS — E119 Type 2 diabetes mellitus without complications: Secondary | ICD-10-CM

## 2020-03-30 DIAGNOSIS — M79674 Pain in right toe(s): Secondary | ICD-10-CM | POA: Diagnosis not present

## 2020-03-30 DIAGNOSIS — M79675 Pain in left toe(s): Secondary | ICD-10-CM

## 2020-03-30 DIAGNOSIS — E1140 Type 2 diabetes mellitus with diabetic neuropathy, unspecified: Secondary | ICD-10-CM | POA: Insufficient documentation

## 2020-03-30 DIAGNOSIS — B351 Tinea unguium: Secondary | ICD-10-CM | POA: Diagnosis not present

## 2020-03-30 DIAGNOSIS — E1165 Type 2 diabetes mellitus with hyperglycemia: Secondary | ICD-10-CM | POA: Insufficient documentation

## 2020-03-30 NOTE — Progress Notes (Signed)
ANNUAL DIABETIC FOOT EXAM  Subjective: Brittany Rojas presents today for for annual diabetic foot examination and callus(es) b/l feet and painful thick toenails that are difficult to trim. Painful toenails interfere with ambulation. Aggravating factors include wearing enclosed shoe gear. Pain is relieved with periodic professional debridement. Painful calluses are aggravated when weightbearing with and without shoegear. Pain is relieved with periodic professional debridement.  Patient relates 25 year h/o diabetes.  Patient denies any h/o foot wounds.  Patient denies any symptoms of foot numbness.  Patient admits some symptoms of foot tingling.  Patient admits some symptoms of burning in feet.  Patient's blood sugar was 114 mg/dl this morning; 124 mg/dl at lunchtime.  Brittany Shanks, MD is patient's PCP. Last visit was December, 2021.  She had left knee surgery on 02/14/2020.  Past Medical History:  Diagnosis Date  . Bronchitis    "touch q now and then" (02/17/2012)  . Depression   . Diverticulitis   . History of blood transfusion 1945   "when I was 64 days old" (02/17/2012)  . History of transient ischemic attack (TIA) 2001   "possibly; never really pinned down what it was" (02/17/2012)  . Hyperlipidemia   . Hypertension   . Migraines    "outgrew them I guess" (02/17/2012)  . OSA on CPAP   . Pneumonia 12/2010  . Shortness of breath    "occasionally; at any time" (02/17/2012)  . Type II diabetes mellitus [facility])    Patient Active Problem List   Diagnosis Date Noted  . Diabetic neuropathy ([place]) 03/30/2020  . Hyperglycemia due to type 2 diabetes mellitus ([place]) 03/30/2020  . Pure hypercholesterolemia 03/30/2020  . Coronary atherosclerosis of native coronary artery 12/08/2012  . DM (diabetes mellitus) ([place]) 02/28/2012  . Sleep apnea 10/29/2011  . NEUROPATHY 06/14/2008  . Hyperlipidemia 04/29/2007  . EXOGENOUS OBESITY 04/29/2007  . DEPRESSION 04/29/2007  . Essential hypertension  04/29/2007  . PERIPHERAL EDEMA 04/29/2007  . History of cardiovascular disorder 04/29/2007  . DIVERTICULITIS, HX OF 04/29/2007   Past Surgical History:  Procedure Laterality Date  . ABDOMINAL HYSTERECTOMY  2000's  . APPENDECTOMY  04/1988  . CARDIAC CATHETERIZATION  02/13/2012  . COLECTOMY  01/18/1988   "took 3 feet out; result of lots of burst diverticula" (02/17/2012)  . COLECTOMY  04/1988   "9 more inches of colon" (02/17/2012)  . COLOSTOMY  01/18/1988  . COLOSTOMY REVERSAL  04/1988  . CORONARY ANGIOPLASTY WITH STENT PLACEMENT  02/17/2012   "1; first one ever" (02/17/2012)  . LEFT OOPHORECTOMY  1989  . PERCUTANEOUS CORONARY STENT INTERVENTION (PCI-S) N/A 02/17/2012   Procedure: PERCUTANEOUS CORONARY STENT INTERVENTION (PCI-S);  Surgeon: Sinclair Grooms, MD;  Location[facility] C[facility] LAB;  Service: Cardiovascular;  Laterality: N/A;   Current Outpatient Medications on File Prior to Visit  Medication Sig Dispense Refill  . albuterol (PROAIR HFA) 108 (90 Base) MCG/ACT inhaler 2 puffs as needed    . amLODipine (NORVASC) 5 MG tablet Take 5 mg by mouth daily.    Marland Kitchen aspirin 81 MG tablet Take 81 mg by mouth daily.    Marland Kitchen atorvastatin (LIPITOR) 80 MG tablet Take 80 mg by mouth daily.     . Bimatoprost (LUMIGAN OP) Place 1 drop into both eyes at bedtime.    . COMBIGAN 0.2-0.5 % ophthalmic solution Place 1 drop into both eyes 2 (two) times daily.  2  . Continuous Blood Gluc Sensor (FREESTYLE LIBRE 14 DAY SENSOR) MISC 1 each by Does not apply  route every 14 (fourteen) days. 2 each 5  . diclofenac sodium (VOLTAREN) 1 % GEL APPLY 2 GRAMS TO AREA UP TO 4 TIMES DAILY    . gabapentin (NEURONTIN) 300 MG capsule TAKE 3 CAPSULES BY MOUTH AT BEDTIME 270 capsule 3  . hydrochlorothiazide (HYDRODIURIL) 25 MG tablet Take 25 mg by mouth daily.    . insulin NPH Human (HUMULIN N,NOVOLIN N) 100 UNIT/ML injection Inject 10 units into the skin each morning and 65 units into the skin each evening.    . insulin regular (NOVOLIN R,HUMULIN  R) 100 units/mL injection Use as directed per scale three (3) times daily.    Marland Kitchen latanoprost (XALATAN) 0.005 % ophthalmic solution INSTILL 1 DROP INTO EACH EYE AT NIGHT OR IN THE EVENING AS DIRECTED    . losartan (COZAAR) 100 MG tablet Take 100 mg by mouth daily.    . Multiple Vitamins-Minerals (MULTI FOR HER 50+) TABS See admin instructions.    . Multiple Vitamins-Minerals (MULTIVITAMIN PO) Take 1 tablet by mouth daily.    . nitroGLYCERIN (NITROSTAT) 0.4 MG SL tablet Place 1 tablet (0.4 mg total) under the tongue every 5 (five) minutes as needed for chest pain. 25 tablet 3  . oxyCODONE (OXY IR/ROXICODONE) 5 MG immediate release tablet TAKE 1 TABLET BY MOUTH EVERY 4 TO 6 HOURS AS NEEDED FOR MODERATE TO SEVERE PAIN    . traMADol (ULTRAM) 50 MG tablet Take one (1) tablet (50 mg) by mouth every eight (8) to twelve (12) hours as needed for pain.  5  . [DISCONTINUED] telmisartan-hydrochlorothiazide (MICARDIS HCT) 80-12.5 MG per tablet Take 1 tablet by mouth daily.       No current facility-administered medications on file prior to visit.    Allergies  Allergen Reactions  . Erythromycin Other (See Comments)    "upset stomach" (02/17/2012)  . Penicillins Other (See Comments)    ">50 yr ago, I had strept throat; had gotten lots and lots of PCN; last dose caused me to stop breathing" (02/17/2012)  . Tetracycline Other (See Comments)    "upset stomach" (02/17/2012)  . Sulfonamide Derivatives Rash and Other (See Comments)    "upset stomach" (02/17/2012)   Social History   Occupational History  . Not on file  Tobacco Use  . Smoking status: Former Smoker    Years: 2.00    Types: Cigarettes    Quit date: 11/11/1969    Years since quitting: 50.4  . Smokeless tobacco: Never Used  Vaping Use  . Vaping Use: Never used  Substance and Sexual Activity  . Alcohol use: No  . Drug use: No  . Sexual activity: Not Currently   Family History  Problem Relation Age of Onset  . Diabetes Mother   . Angina Mother    . Lung cancer Father   . Other Sister        BRAIN TUMOR   Immunization History  Administered Date(s) Administered  . Influenza Whole 12/16/2007, 12/15/2008, 11/14/2009  . Influenza-Unspecified 12/13/2010, 12/13/2011, 11/10/2012, 10/12/2013  . Pneumococcal Polysaccharide-23 02/11/2005  . Td 02/11/2005     Review of Systems: Negative except as noted in the HPI.  Objective: There were no vitals filed for this visit.  SHELBYLYNN WALCZYK is a pleasant 75 y.o. female in NAD. AAO X 3.  Vascular Examination: Capillary fill time to digits <3 seconds b/l lower extremities. Faintly palpable pedal pulses b/l. Pedal hair sparse. Lower extremity skin temperature gradient within normal limits. Varicosities present b/l.  Dermatological Examination: Pedal skin  with normal turgor, texture and tone bilaterally. No open wounds bilaterally. No interdigital macerations bilaterally. Toenails 1-5 b/l elongated, discolored, dystrophic, thickened, crumbly with subungual debris and tenderness to dorsal palpation. Hyperkeratotic lesion(s) submet head 1 right foot, submet head 2 right foot and 1st metatarsal head left lower extremity.  No erythema, no edema, no drainage, no fluctuance.  Musculoskeletal Examination: Normal muscle strength 5/5 to all lower extremity muscle groups bilaterally. No pain crepitus or joint limitation noted with ROM b/l. Hallux valgus with bunion deformity noted b/l lower extremities. Hammertoes noted to the b/l lower extremities. Pes planus deformity noted b/l.   Footwear Assessment: Does the patient wear appropriate shoes? Yes. Does the patient need inserts/orthotics? Yes..  Neurological Examination: Protective sensation intact 5/5 intact bilaterally with 10g monofilament b/l. Vibratory sensation diminished b/l.  Assessment: 1. Pain due to onychomycosis of toenails of both feet   2. Callus of foot   3. Diabetic polyneuropathy associated with type 2 diabetes mellitus ([place])   4. Pes  planus of both feet   5. Acquired hammertoes of both feet   6. Encounter for diabetic foot exam ([place])     ADA Risk Categorization: Low Risk :  Patient has all of the following: Intact protective sensation No prior foot ulcer  No severe deformity Pedal pulses present Plan: -Examined patient. -Diabetic foot examination performed on today's visit. -Patient to continue soft, supportive shoe gear daily. -Toenails 1-5 b/l were debrided in length and girth with sterile nail nippers and dremel without iatrogenic bleeding.  -Callus(es) submet head 1 right foot, submet head 2 right foot and 1st metatarsal head left lower extremity pared utilizing sterile scalpel blade without complication or incident. Total number debrided =3. -Patient to report any pedal injuries to medical professional immediately. -Patient/POA to call should there be question/concern in the interim.  Return in about 3 months (around 06/27/2020).  Marzetta Board, DPM

## 2020-03-31 DIAGNOSIS — M6281 Muscle weakness (generalized): Secondary | ICD-10-CM | POA: Diagnosis not present

## 2020-03-31 DIAGNOSIS — M25662 Stiffness of left knee, not elsewhere classified: Secondary | ICD-10-CM | POA: Diagnosis not present

## 2020-03-31 DIAGNOSIS — M25562 Pain in left knee: Secondary | ICD-10-CM | POA: Diagnosis not present

## 2020-04-05 DIAGNOSIS — M6281 Muscle weakness (generalized): Secondary | ICD-10-CM | POA: Diagnosis not present

## 2020-04-05 DIAGNOSIS — M25662 Stiffness of left knee, not elsewhere classified: Secondary | ICD-10-CM | POA: Diagnosis not present

## 2020-04-05 DIAGNOSIS — M25562 Pain in left knee: Secondary | ICD-10-CM | POA: Diagnosis not present

## 2020-04-07 ENCOUNTER — Ambulatory Visit: Payer: Medicare Other | Admitting: Sports Medicine

## 2020-04-07 DIAGNOSIS — M25662 Stiffness of left knee, not elsewhere classified: Secondary | ICD-10-CM | POA: Diagnosis not present

## 2020-04-07 DIAGNOSIS — M25562 Pain in left knee: Secondary | ICD-10-CM | POA: Diagnosis not present

## 2020-04-07 DIAGNOSIS — M6281 Muscle weakness (generalized): Secondary | ICD-10-CM | POA: Diagnosis not present

## 2020-04-11 DIAGNOSIS — Z96652 Presence of left artificial knee joint: Secondary | ICD-10-CM | POA: Diagnosis not present

## 2020-04-11 DIAGNOSIS — M1712 Unilateral primary osteoarthritis, left knee: Secondary | ICD-10-CM | POA: Diagnosis not present

## 2020-04-12 DIAGNOSIS — M25662 Stiffness of left knee, not elsewhere classified: Secondary | ICD-10-CM | POA: Diagnosis not present

## 2020-04-12 DIAGNOSIS — M25562 Pain in left knee: Secondary | ICD-10-CM | POA: Diagnosis not present

## 2020-04-12 DIAGNOSIS — M6281 Muscle weakness (generalized): Secondary | ICD-10-CM | POA: Diagnosis not present

## 2020-04-14 DIAGNOSIS — M6281 Muscle weakness (generalized): Secondary | ICD-10-CM | POA: Diagnosis not present

## 2020-04-14 DIAGNOSIS — M25562 Pain in left knee: Secondary | ICD-10-CM | POA: Diagnosis not present

## 2020-04-14 DIAGNOSIS — M25662 Stiffness of left knee, not elsewhere classified: Secondary | ICD-10-CM | POA: Diagnosis not present

## 2020-04-19 ENCOUNTER — Other Ambulatory Visit: Payer: Self-pay

## 2020-04-19 ENCOUNTER — Ambulatory Visit (INDEPENDENT_AMBULATORY_CARE_PROVIDER_SITE_OTHER): Payer: Medicare Other | Admitting: Sports Medicine

## 2020-04-19 DIAGNOSIS — M25662 Stiffness of left knee, not elsewhere classified: Secondary | ICD-10-CM | POA: Diagnosis not present

## 2020-04-19 DIAGNOSIS — M2041 Other hammer toe(s) (acquired), right foot: Secondary | ICD-10-CM

## 2020-04-19 DIAGNOSIS — M6281 Muscle weakness (generalized): Secondary | ICD-10-CM | POA: Diagnosis not present

## 2020-04-19 DIAGNOSIS — M25562 Pain in left knee: Secondary | ICD-10-CM | POA: Diagnosis not present

## 2020-04-19 DIAGNOSIS — L84 Corns and callosities: Secondary | ICD-10-CM

## 2020-04-19 DIAGNOSIS — E1142 Type 2 diabetes mellitus with diabetic polyneuropathy: Secondary | ICD-10-CM

## 2020-04-19 DIAGNOSIS — M2042 Other hammer toe(s) (acquired), left foot: Secondary | ICD-10-CM

## 2020-04-21 DIAGNOSIS — M6281 Muscle weakness (generalized): Secondary | ICD-10-CM | POA: Diagnosis not present

## 2020-04-21 DIAGNOSIS — M25562 Pain in left knee: Secondary | ICD-10-CM | POA: Diagnosis not present

## 2020-04-21 DIAGNOSIS — M25662 Stiffness of left knee, not elsewhere classified: Secondary | ICD-10-CM | POA: Diagnosis not present

## 2020-04-21 NOTE — Progress Notes (Signed)
Patient presented for foam casting for 3 pair custom diabetic shoe inserts. Patient is measured with a brannok device to be a size 8 medium.  Diabetic shoes are chosen from the [employer] catalog. The shoes chosen are [id number].  The patient will be contacted when the shoes and inserts are ready to picked up.

## 2020-04-24 DIAGNOSIS — I10 Essential (primary) hypertension: Secondary | ICD-10-CM | POA: Diagnosis not present

## 2020-04-24 DIAGNOSIS — E1142 Type 2 diabetes mellitus with diabetic polyneuropathy: Secondary | ICD-10-CM | POA: Diagnosis not present

## 2020-04-24 DIAGNOSIS — E1165 Type 2 diabetes mellitus with hyperglycemia: Secondary | ICD-10-CM | POA: Diagnosis not present

## 2020-04-24 DIAGNOSIS — E119 Type 2 diabetes mellitus without complications: Secondary | ICD-10-CM | POA: Diagnosis not present

## 2020-04-24 DIAGNOSIS — I2510 Atherosclerotic heart disease of native coronary artery without angina pectoris: Secondary | ICD-10-CM | POA: Diagnosis not present

## 2020-04-24 DIAGNOSIS — E782 Mixed hyperlipidemia: Secondary | ICD-10-CM | POA: Diagnosis not present

## 2020-05-01 DIAGNOSIS — E782 Mixed hyperlipidemia: Secondary | ICD-10-CM | POA: Diagnosis not present

## 2020-05-01 DIAGNOSIS — I10 Essential (primary) hypertension: Secondary | ICD-10-CM | POA: Diagnosis not present

## 2020-05-01 DIAGNOSIS — Z23 Encounter for immunization: Secondary | ICD-10-CM | POA: Diagnosis not present

## 2020-05-01 DIAGNOSIS — L989 Disorder of the skin and subcutaneous tissue, unspecified: Secondary | ICD-10-CM | POA: Diagnosis not present

## 2020-05-01 DIAGNOSIS — E1142 Type 2 diabetes mellitus with diabetic polyneuropathy: Secondary | ICD-10-CM | POA: Diagnosis not present

## 2020-05-01 DIAGNOSIS — Z96652 Presence of left artificial knee joint: Secondary | ICD-10-CM | POA: Diagnosis not present

## 2020-05-01 DIAGNOSIS — M1712 Unilateral primary osteoarthritis, left knee: Secondary | ICD-10-CM | POA: Diagnosis not present

## 2020-05-01 DIAGNOSIS — E1165 Type 2 diabetes mellitus with hyperglycemia: Secondary | ICD-10-CM | POA: Diagnosis not present

## 2020-05-01 DIAGNOSIS — E119 Type 2 diabetes mellitus without complications: Secondary | ICD-10-CM | POA: Diagnosis not present

## 2020-05-01 DIAGNOSIS — M1711 Unilateral primary osteoarthritis, right knee: Secondary | ICD-10-CM | POA: Diagnosis not present

## 2020-05-01 DIAGNOSIS — I2510 Atherosclerotic heart disease of native coronary artery without angina pectoris: Secondary | ICD-10-CM | POA: Diagnosis not present

## 2020-05-08 DIAGNOSIS — C4441 Basal cell carcinoma of skin of scalp and neck: Secondary | ICD-10-CM | POA: Diagnosis not present

## 2020-05-09 DIAGNOSIS — M6530 Trigger finger, unspecified finger: Secondary | ICD-10-CM | POA: Diagnosis not present

## 2020-05-11 DIAGNOSIS — H401131 Primary open-angle glaucoma, bilateral, mild stage: Secondary | ICD-10-CM | POA: Diagnosis not present

## 2020-05-11 DIAGNOSIS — M1711 Unilateral primary osteoarthritis, right knee: Secondary | ICD-10-CM | POA: Diagnosis not present

## 2020-05-11 DIAGNOSIS — I2510 Atherosclerotic heart disease of native coronary artery without angina pectoris: Secondary | ICD-10-CM | POA: Diagnosis not present

## 2020-05-11 DIAGNOSIS — I10 Essential (primary) hypertension: Secondary | ICD-10-CM | POA: Diagnosis not present

## 2020-05-11 DIAGNOSIS — L989 Disorder of the skin and subcutaneous tissue, unspecified: Secondary | ICD-10-CM | POA: Diagnosis not present

## 2020-05-11 DIAGNOSIS — Z96652 Presence of left artificial knee joint: Secondary | ICD-10-CM | POA: Diagnosis not present

## 2020-05-11 DIAGNOSIS — E1142 Type 2 diabetes mellitus with diabetic polyneuropathy: Secondary | ICD-10-CM | POA: Diagnosis not present

## 2020-05-11 DIAGNOSIS — E1165 Type 2 diabetes mellitus with hyperglycemia: Secondary | ICD-10-CM | POA: Diagnosis not present

## 2020-05-11 DIAGNOSIS — E782 Mixed hyperlipidemia: Secondary | ICD-10-CM | POA: Diagnosis not present

## 2020-05-11 DIAGNOSIS — M1712 Unilateral primary osteoarthritis, left knee: Secondary | ICD-10-CM | POA: Diagnosis not present

## 2020-05-11 DIAGNOSIS — E119 Type 2 diabetes mellitus without complications: Secondary | ICD-10-CM | POA: Diagnosis not present

## 2020-05-13 DIAGNOSIS — Z23 Encounter for immunization: Secondary | ICD-10-CM | POA: Diagnosis not present

## 2020-05-15 DIAGNOSIS — C4441 Basal cell carcinoma of skin of scalp and neck: Secondary | ICD-10-CM | POA: Diagnosis not present

## 2020-05-30 DIAGNOSIS — M65341 Trigger finger, right ring finger: Secondary | ICD-10-CM | POA: Diagnosis not present

## 2020-06-02 ENCOUNTER — Encounter: Payer: Self-pay | Admitting: Sports Medicine

## 2020-06-02 ENCOUNTER — Other Ambulatory Visit: Payer: Self-pay

## 2020-06-02 ENCOUNTER — Ambulatory Visit (INDEPENDENT_AMBULATORY_CARE_PROVIDER_SITE_OTHER): Payer: Medicare Other | Admitting: Sports Medicine

## 2020-06-02 DIAGNOSIS — M79675 Pain in left toe(s): Secondary | ICD-10-CM

## 2020-06-02 DIAGNOSIS — L84 Corns and callosities: Secondary | ICD-10-CM

## 2020-06-02 DIAGNOSIS — E1142 Type 2 diabetes mellitus with diabetic polyneuropathy: Secondary | ICD-10-CM | POA: Diagnosis not present

## 2020-06-02 DIAGNOSIS — M79674 Pain in right toe(s): Secondary | ICD-10-CM

## 2020-06-02 DIAGNOSIS — M2042 Other hammer toe(s) (acquired), left foot: Secondary | ICD-10-CM

## 2020-06-02 DIAGNOSIS — M2141 Flat foot [pes planus] (acquired), right foot: Secondary | ICD-10-CM

## 2020-06-02 DIAGNOSIS — M2142 Flat foot [pes planus] (acquired), left foot: Secondary | ICD-10-CM

## 2020-06-02 DIAGNOSIS — M2041 Other hammer toe(s) (acquired), right foot: Secondary | ICD-10-CM

## 2020-06-02 DIAGNOSIS — B351 Tinea unguium: Secondary | ICD-10-CM

## 2020-06-02 NOTE — Progress Notes (Addendum)
Subjective: Brittany Rojas is a 75 y.o. female patient with history of diabetes who presents to office today complaining of painful callus and nails; unable to trim. Reports callus on bottom of right foot is also sore today. Fasting blood sugar 105 A1c unknown PCP Dr. Jacelyn Grip x few weeks ago  Patient Active Problem List   Diagnosis Date Noted  . Diabetic neuropathy ([place]) 03/30/2020  . Hyperglycemia due to type 2 diabetes mellitus ([place]) 03/30/2020  . Pure hypercholesterolemia 03/30/2020  . Coronary atherosclerosis of native coronary artery 12/08/2012  . DM (diabetes mellitus) ([place]) 02/28/2012  . Sleep apnea 10/29/2011  . NEUROPATHY 06/14/2008  . Hyperlipidemia 04/29/2007  . EXOGENOUS OBESITY 04/29/2007  . DEPRESSION 04/29/2007  . Essential hypertension 04/29/2007  . PERIPHERAL EDEMA 04/29/2007  . History of cardiovascular disorder 04/29/2007  . DIVERTICULITIS, HX OF 04/29/2007   Current Outpatient Medications on File Prior to Visit  Medication Sig Dispense Refill  . albuterol (PROAIR HFA) 108 (90 Base) MCG/ACT inhaler 2 puffs as needed    . amLODipine (NORVASC) 5 MG tablet Take 5 mg by mouth daily.    Marland Kitchen aspirin 81 MG tablet Take 81 mg by mouth daily.    Marland Kitchen atorvastatin (LIPITOR) 80 MG tablet Take 80 mg by mouth daily.     . Bimatoprost (LUMIGAN OP) Place 1 drop into both eyes at bedtime.    . COMBIGAN 0.2-0.5 % ophthalmic solution Place 1 drop into both eyes 2 (two) times daily.  2  . Continuous Blood Gluc Sensor (FREESTYLE LIBRE 14 DAY SENSOR) MISC 1 each by Does not apply route every 14 (fourteen) days. 2 each 5  . diclofenac sodium (VOLTAREN) 1 % GEL APPLY 2 GRAMS TO AREA UP TO 4 TIMES DAILY    . gabapentin (NEURONTIN) 300 MG capsule TAKE 3 CAPSULES BY MOUTH AT BEDTIME 270 capsule 3  . hydrochlorothiazide (HYDRODIURIL) 25 MG tablet Take 25 mg by mouth daily.    . insulin NPH Human (HUMULIN N,NOVOLIN N) 100 UNIT/ML injection Inject 10 units into the skin each morning and 65 units  into the skin each evening.    . insulin regular (NOVOLIN R,HUMULIN R) 100 units/mL injection Use as directed per scale three (3) times daily.    Marland Kitchen latanoprost (XALATAN) 0.005 % ophthalmic solution INSTILL 1 DROP INTO EACH EYE AT NIGHT OR IN THE EVENING AS DIRECTED    . losartan (COZAAR) 100 MG tablet Take 100 mg by mouth daily.    . Multiple Vitamins-Minerals (MULTI FOR HER 50+) TABS See admin instructions.    . Multiple Vitamins-Minerals (MULTIVITAMIN PO) Take 1 tablet by mouth daily.    . nitroGLYCERIN (NITROSTAT) 0.4 MG SL tablet Place 1 tablet (0.4 mg total) under the tongue every 5 (five) minutes as needed for chest pain. 25 tablet 3  . oxyCODONE (OXY IR/ROXICODONE) 5 MG immediate release tablet TAKE 1 TABLET BY MOUTH EVERY 4 TO 6 HOURS AS NEEDED FOR MODERATE TO SEVERE PAIN    . traMADol (ULTRAM) 50 MG tablet Take one (1) tablet (50 mg) by mouth every eight (8) to twelve (12) hours as needed for pain.  5  . [DISCONTINUED] telmisartan-hydrochlorothiazide (MICARDIS HCT) 80-12.5 MG per tablet Take 1 tablet by mouth daily.       No current facility-administered medications on file prior to visit.   Allergies  Allergen Reactions  . Erythromycin Other (See Comments)    "upset stomach" (02/17/2012)  . Penicillins Other (See Comments)    ">50 yr ago, I had  strept throat; had gotten lots and lots of PCN; last dose caused me to stop breathing" (02/17/2012)  . Tetracycline Other (See Comments)    "upset stomach" (02/17/2012)  . Sulfonamide Derivatives Rash and Other (See Comments)    "upset stomach" (02/17/2012)  . Guaifenesin Er Other (See Comments)  . Morphine Nausea And Vomiting  . Sulfa Antibiotics Other (See Comments)  . Sulfur     No results found for this or any previous visit (from the past 2160 hour(s)).  Objective: General: Patient is awake, alert, and oriented x 3 and in no acute distress.  Integument: Skin is warm, dry and supple bilateral. Nails long and, thickened and dystrophic  with subungual debris, consistent with onychomycosis, 1-5 bilateral. No signs of infection.  Callus right sub-met 2 and left medial bunion.  Remaining integument unremarkable.  Vasculature:  Dorsalis Pedis pulse 1/4 bilateral. Posterior Tibial pulse  1/4 bilateral. Capillary fill time <3 sec 1-5 bilateral.  Scant hair growth to the level of the digits.Temperature gradient within normal limits.  Moderate varicosities present bilateral. No edema present bilateral.   Neurology: The patient has intact sensation measured with a 5.07/10g Semmes Weinstein Monofilament at all pedal sites bilateral . Vibratory sensation diminished bilateral with tuning fork. No Babinski sign present bilateral.   Musculoskeletal: Bunion and hammertoe with pes planus pedal deformities noted bilateral. Muscular strength 5/5 in all lower extremity muscular groups bilateral without pain on range of motion . No tenderness with calf compression bilateral.  Assessment and Plan: Problem List Items Addressed This Visit      Endocrine   Diabetic neuropathy ([place])    Other Visit Diagnoses    Pain due to onychomycosis of toenails of both feet    -  Primary   Acquired hammertoes of both feet       Callus of foot       Pes planus of both feet         -Examined patient. -Re-discussed the importance of daily foot inspection setting diabetes -Mechanically debrided all nails using a sterile nail nipper and debrided callus x2 at right sub-met 2 and left medial bunion using sterile chisel blade and filed with dremel without incident -Advised good supportive shoes daily for foot type like previous; patient is awaiting diabetic shoes -Patient to return in 62 days for nail and callus care  -Patient advised to call the office if any problems or questions arise in the meantime.  Landis Martins, DPM

## 2020-06-05 DIAGNOSIS — C4441 Basal cell carcinoma of skin of scalp and neck: Secondary | ICD-10-CM | POA: Diagnosis not present

## 2020-06-05 DIAGNOSIS — D485 Neoplasm of uncertain behavior of skin: Secondary | ICD-10-CM | POA: Diagnosis not present

## 2020-06-14 DIAGNOSIS — M25562 Pain in left knee: Secondary | ICD-10-CM | POA: Insufficient documentation

## 2020-06-14 DIAGNOSIS — M5126 Other intervertebral disc displacement, lumbar region: Secondary | ICD-10-CM | POA: Diagnosis not present

## 2020-06-14 DIAGNOSIS — M5416 Radiculopathy, lumbar region: Secondary | ICD-10-CM | POA: Diagnosis not present

## 2020-06-28 DIAGNOSIS — E1142 Type 2 diabetes mellitus with diabetic polyneuropathy: Secondary | ICD-10-CM | POA: Diagnosis not present

## 2020-06-28 DIAGNOSIS — E782 Mixed hyperlipidemia: Secondary | ICD-10-CM | POA: Diagnosis not present

## 2020-06-28 DIAGNOSIS — M1711 Unilateral primary osteoarthritis, right knee: Secondary | ICD-10-CM | POA: Diagnosis not present

## 2020-06-28 DIAGNOSIS — I10 Essential (primary) hypertension: Secondary | ICD-10-CM | POA: Diagnosis not present

## 2020-06-28 DIAGNOSIS — E1165 Type 2 diabetes mellitus with hyperglycemia: Secondary | ICD-10-CM | POA: Diagnosis not present

## 2020-06-28 DIAGNOSIS — M1712 Unilateral primary osteoarthritis, left knee: Secondary | ICD-10-CM | POA: Diagnosis not present

## 2020-06-28 DIAGNOSIS — E119 Type 2 diabetes mellitus without complications: Secondary | ICD-10-CM | POA: Diagnosis not present

## 2020-06-28 DIAGNOSIS — I2510 Atherosclerotic heart disease of native coronary artery without angina pectoris: Secondary | ICD-10-CM | POA: Diagnosis not present

## 2020-07-04 ENCOUNTER — Other Ambulatory Visit: Payer: Self-pay

## 2020-07-04 ENCOUNTER — Other Ambulatory Visit: Payer: Medicare Other

## 2020-07-04 DIAGNOSIS — M2142 Flat foot [pes planus] (acquired), left foot: Secondary | ICD-10-CM

## 2020-07-04 DIAGNOSIS — M2042 Other hammer toe(s) (acquired), left foot: Secondary | ICD-10-CM

## 2020-07-04 DIAGNOSIS — M2141 Flat foot [pes planus] (acquired), right foot: Secondary | ICD-10-CM

## 2020-07-04 DIAGNOSIS — M2041 Other hammer toe(s) (acquired), right foot: Secondary | ICD-10-CM

## 2020-07-04 DIAGNOSIS — E1142 Type 2 diabetes mellitus with diabetic polyneuropathy: Secondary | ICD-10-CM

## 2020-07-13 DIAGNOSIS — E1165 Type 2 diabetes mellitus with hyperglycemia: Secondary | ICD-10-CM | POA: Diagnosis not present

## 2020-07-13 DIAGNOSIS — I2510 Atherosclerotic heart disease of native coronary artery without angina pectoris: Secondary | ICD-10-CM | POA: Diagnosis not present

## 2020-07-13 DIAGNOSIS — E1140 Type 2 diabetes mellitus with diabetic neuropathy, unspecified: Secondary | ICD-10-CM | POA: Diagnosis not present

## 2020-07-13 DIAGNOSIS — E7800 Pure hypercholesterolemia, unspecified: Secondary | ICD-10-CM | POA: Diagnosis not present

## 2020-07-13 DIAGNOSIS — I10 Essential (primary) hypertension: Secondary | ICD-10-CM | POA: Diagnosis not present

## 2020-07-25 DIAGNOSIS — G4733 Obstructive sleep apnea (adult) (pediatric): Secondary | ICD-10-CM | POA: Diagnosis not present

## 2020-08-01 DIAGNOSIS — E119 Type 2 diabetes mellitus without complications: Secondary | ICD-10-CM | POA: Diagnosis not present

## 2020-08-01 DIAGNOSIS — M1711 Unilateral primary osteoarthritis, right knee: Secondary | ICD-10-CM | POA: Diagnosis not present

## 2020-08-01 DIAGNOSIS — H04563 Stenosis of bilateral lacrimal punctum: Secondary | ICD-10-CM | POA: Diagnosis not present

## 2020-08-01 DIAGNOSIS — I2510 Atherosclerotic heart disease of native coronary artery without angina pectoris: Secondary | ICD-10-CM | POA: Diagnosis not present

## 2020-08-01 DIAGNOSIS — E1142 Type 2 diabetes mellitus with diabetic polyneuropathy: Secondary | ICD-10-CM | POA: Diagnosis not present

## 2020-08-01 DIAGNOSIS — I10 Essential (primary) hypertension: Secondary | ICD-10-CM | POA: Diagnosis not present

## 2020-08-01 DIAGNOSIS — M1712 Unilateral primary osteoarthritis, left knee: Secondary | ICD-10-CM | POA: Diagnosis not present

## 2020-08-01 DIAGNOSIS — H401131 Primary open-angle glaucoma, bilateral, mild stage: Secondary | ICD-10-CM | POA: Diagnosis not present

## 2020-08-01 DIAGNOSIS — E782 Mixed hyperlipidemia: Secondary | ICD-10-CM | POA: Diagnosis not present

## 2020-08-01 DIAGNOSIS — E1165 Type 2 diabetes mellitus with hyperglycemia: Secondary | ICD-10-CM | POA: Diagnosis not present

## 2020-08-04 ENCOUNTER — Other Ambulatory Visit: Payer: Self-pay

## 2020-08-04 ENCOUNTER — Ambulatory Visit (INDEPENDENT_AMBULATORY_CARE_PROVIDER_SITE_OTHER): Payer: Medicare Other | Admitting: Sports Medicine

## 2020-08-04 ENCOUNTER — Encounter: Payer: Self-pay | Admitting: Sports Medicine

## 2020-08-04 DIAGNOSIS — J9801 Acute bronchospasm: Secondary | ICD-10-CM | POA: Insufficient documentation

## 2020-08-04 DIAGNOSIS — M79674 Pain in right toe(s): Secondary | ICD-10-CM

## 2020-08-04 DIAGNOSIS — E559 Vitamin D deficiency, unspecified: Secondary | ICD-10-CM | POA: Insufficient documentation

## 2020-08-04 DIAGNOSIS — L84 Corns and callosities: Secondary | ICD-10-CM | POA: Insufficient documentation

## 2020-08-04 DIAGNOSIS — K529 Noninfective gastroenteritis and colitis, unspecified: Secondary | ICD-10-CM | POA: Insufficient documentation

## 2020-08-04 DIAGNOSIS — B351 Tinea unguium: Secondary | ICD-10-CM

## 2020-08-04 DIAGNOSIS — M2042 Other hammer toe(s) (acquired), left foot: Secondary | ICD-10-CM | POA: Insufficient documentation

## 2020-08-04 DIAGNOSIS — M1712 Unilateral primary osteoarthritis, left knee: Secondary | ICD-10-CM | POA: Insufficient documentation

## 2020-08-04 DIAGNOSIS — M6530 Trigger finger, unspecified finger: Secondary | ICD-10-CM | POA: Insufficient documentation

## 2020-08-04 DIAGNOSIS — M21611 Bunion of right foot: Secondary | ICD-10-CM | POA: Insufficient documentation

## 2020-08-04 DIAGNOSIS — R42 Dizziness and giddiness: Secondary | ICD-10-CM | POA: Insufficient documentation

## 2020-08-04 DIAGNOSIS — M2041 Other hammer toe(s) (acquired), right foot: Secondary | ICD-10-CM

## 2020-08-04 DIAGNOSIS — E1142 Type 2 diabetes mellitus with diabetic polyneuropathy: Secondary | ICD-10-CM | POA: Diagnosis not present

## 2020-08-04 DIAGNOSIS — M2141 Flat foot [pes planus] (acquired), right foot: Secondary | ICD-10-CM

## 2020-08-04 DIAGNOSIS — M79675 Pain in left toe(s): Secondary | ICD-10-CM

## 2020-08-04 DIAGNOSIS — J302 Other seasonal allergic rhinitis: Secondary | ICD-10-CM | POA: Insufficient documentation

## 2020-08-04 DIAGNOSIS — H6120 Impacted cerumen, unspecified ear: Secondary | ICD-10-CM | POA: Insufficient documentation

## 2020-08-04 DIAGNOSIS — G629 Polyneuropathy, unspecified: Secondary | ICD-10-CM | POA: Insufficient documentation

## 2020-08-04 DIAGNOSIS — Z96659 Presence of unspecified artificial knee joint: Secondary | ICD-10-CM | POA: Insufficient documentation

## 2020-08-04 DIAGNOSIS — E119 Type 2 diabetes mellitus without complications: Secondary | ICD-10-CM | POA: Insufficient documentation

## 2020-08-04 DIAGNOSIS — M2142 Flat foot [pes planus] (acquired), left foot: Secondary | ICD-10-CM

## 2020-08-04 NOTE — Progress Notes (Signed)
Subjective: Brittany Rojas is a 75 y.o. female patient with history of diabetes who presents to office today complaining of painful callus and nails; unable to trim. Reports callus on bottom of right foot is also sore today but has been doing better since she got her diabetic shoes.  Fasting blood sugar not recorded.   Patient Active Problem List   Diagnosis Date Noted   Acquired hammer toe of left foot 08/04/2020   Acquired trigger finger 08/04/2020   Arthritis of left knee 08/04/2020   Artificial knee joint present 08/04/2020   Bronchospasm 08/04/2020   Bunion of right foot 08/04/2020   Foot callus 08/04/2020   Gastroenteritis 08/04/2020   Impacted cerumen 08/04/2020   Neuropathy 08/04/2020   Other seasonal allergic rhinitis 08/04/2020   Polyneuropathy due to type 2 diabetes mellitus ([place]) 08/04/2020   Type 2 diabetes mellitus without complications ([place]) 78/29/5621   Vertigo 08/04/2020   Vitamin D deficiency 08/04/2020   Displacement of lumbar intervertebral disc without myelopathy 06/14/2020   Knee pain, left 06/14/2020   Diabetic neuropathy ([place]) 03/30/2020   Hyperglycemia due to type 2 diabetes mellitus ([place]) 03/30/2020   Pure hypercholesterolemia 03/30/2020   Shoulder pain 08/01/2014   Chronic low back pain 12/21/2013   Lumbosacral spondylosis with radiculopathy 12/21/2013   Lumbar radiculopathy 10/11/2013   Coronary atherosclerosis of native coronary artery 12/08/2012   DM (diabetes mellitus) ([place]) 02/28/2012   Sleep apnea 10/29/2011   NEUROPATHY 06/14/2008   Hyperlipidemia 04/29/2007   EXOGENOUS OBESITY 04/29/2007   DEPRESSION 04/29/2007   Essential hypertension 04/29/2007   PERIPHERAL EDEMA 04/29/2007   History of cardiovascular disorder 04/29/2007   DIVERTICULITIS, HX OF 04/29/2007   Current Outpatient Medications on File Prior to Visit  Medication Sig Dispense Refill   albuterol (PROAIR HFA) 108 (90 Base) MCG/ACT inhaler 2 puffs as needed     amLODipine  (NORVASC) 5 MG tablet Take 5 mg by mouth daily.     aspirin 81 MG tablet Take 81 mg by mouth daily.     atorvastatin (LIPITOR) 80 MG tablet Take 80 mg by mouth daily.      Bimatoprost (LUMIGAN OP) Place 1 drop into both eyes at bedtime.     clindamycin (CLEOCIN) 150 MG capsule SMARTSIG:4 Capsule(s) By Mouth Once     COMBIGAN 0.2-0.5 % ophthalmic solution Place 1 drop into both eyes 2 (two) times daily.  2   Continuous Blood Gluc Sensor (FREESTYLE LIBRE 14 DAY SENSOR) MISC 1 each by Does not apply route every 14 (fourteen) days. 2 each 5   diclofenac sodium (VOLTAREN) 1 % GEL APPLY 2 GRAMS TO AREA UP TO 4 TIMES DAILY     gabapentin (NEURONTIN) 300 MG capsule TAKE 3 CAPSULES BY MOUTH AT BEDTIME 270 capsule 3   hydrochlorothiazide (HYDRODIURIL) 25 MG tablet Take 25 mg by mouth daily.     insulin NPH Human (HUMULIN N,NOVOLIN N) 100 UNIT/ML injection Inject 10 units into the skin each morning and 65 units into the skin each evening.     insulin regular (NOVOLIN R,HUMULIN R) 100 units/mL injection Use as directed per scale three (3) times daily.     latanoprost (XALATAN) 0.005 % ophthalmic solution INSTILL 1 DROP INTO EACH EYE AT NIGHT OR IN THE EVENING AS DIRECTED     losartan (COZAAR) 100 MG tablet Take 100 mg by mouth daily.     Multiple Vitamins-Minerals (MULTI FOR HER 50+) TABS See admin instructions.     Multiple Vitamins-Minerals (MULTIVITAMIN PO) Take 1  tablet by mouth daily.     nitroGLYCERIN (NITROSTAT) 0.4 MG SL tablet Place 1 tablet (0.4 mg total) under the tongue every 5 (five) minutes as needed for chest pain. 25 tablet 3   oxyCODONE (OXY IR/ROXICODONE) 5 MG immediate release tablet TAKE 1 TABLET BY MOUTH EVERY 4 TO 6 HOURS AS NEEDED FOR MODERATE TO SEVERE PAIN     traMADol (ULTRAM) 50 MG tablet Take one (1) tablet (50 mg) by mouth every eight (8) to twelve (12) hours as needed for pain.  5   traMADol (ULTRAM) 50 MG tablet Take by mouth.     [DISCONTINUED] telmisartan-hydrochlorothiazide  (MICARDIS HCT) 80-12.5 MG per tablet Take 1 tablet by mouth daily.       No current facility-administered medications on file prior to visit.   Allergies  Allergen Reactions   Erythromycin Other (See Comments)    "upset stomach" (02/17/2012)   Penicillins Other (See Comments)    ">50 yr ago, I had strept throat; had gotten lots and lots of PCN; last dose caused me to stop breathing" (02/17/2012)   Tetracycline Other (See Comments)    "upset stomach" (02/17/2012)   Sulfonamide Derivatives Rash and Other (See Comments)    "upset stomach" (02/17/2012)   Guaifenesin Er Other (See Comments)   Morphine Nausea And Vomiting   Sulfa Antibiotics Other (See Comments)   Sulfur     No results found for this or any previous visit (from the past 2160 hour(s)).  Objective: General: Patient is awake, alert, and oriented x 3 and in no acute distress.  Integument: Skin is warm, dry and supple bilateral. Nails long and, thickened and dystrophic with subungual debris, consistent with onychomycosis, 1-5 bilateral. No signs of infection.  Callus right sub-met 2 and left medial bunion.  Remaining integument unremarkable.  Vasculature:  Dorsalis Pedis pulse 1/4 bilateral. Posterior Tibial pulse  1/4 bilateral. Capillary fill time <3 sec 1-5 bilateral.  Scant hair growth to the level of the digits.Temperature gradient within normal limits.  Moderate varicosities present bilateral. No edema present bilateral.   Neurology: Unchanged from prior.   Musculoskeletal: Bunion and hammertoe with pes planus pedal deformities noted bilateral. Muscular strength 5/5 in all lower extremity muscular groups bilateral without pain on range of motion . No tenderness with calf compression bilateral.  Assessment and Plan: Problem List Items Addressed This Visit       Endocrine   Diabetic neuropathy ([place])   Other Visit Diagnoses     Pain due to onychomycosis of toenails of both feet    -  Primary   Relevant Medications    clindamycin (CLEOCIN) 150 MG capsule   Acquired hammertoes of both feet       Callus of foot       Pes planus of both feet          -Examined patient. -Re-discussed the importance of daily foot inspection setting diabetes -Mechanically debrided all nails using a sterile nail nipper and debrided callus x2 at right sub-met 2 and left medial bunion using sterile chisel blade and filed with dremel without incident -Continue with diabetic shoes -Patient to return in 62 days for nail and callus care  -Patient advised to call the office if any problems or questions arise in the meantime.  Landis Martins, DPM

## 2020-08-07 DIAGNOSIS — H16143 Punctate keratitis, bilateral: Secondary | ICD-10-CM | POA: Diagnosis not present

## 2020-08-07 DIAGNOSIS — H10413 Chronic giant papillary conjunctivitis, bilateral: Secondary | ICD-10-CM | POA: Diagnosis not present

## 2020-08-07 DIAGNOSIS — H02132 Senile ectropion of right lower eyelid: Secondary | ICD-10-CM | POA: Diagnosis not present

## 2020-08-07 DIAGNOSIS — H02135 Senile ectropion of left lower eyelid: Secondary | ICD-10-CM | POA: Diagnosis not present

## 2020-08-08 DIAGNOSIS — M5417 Radiculopathy, lumbosacral region: Secondary | ICD-10-CM | POA: Diagnosis not present

## 2020-08-21 DIAGNOSIS — H16143 Punctate keratitis, bilateral: Secondary | ICD-10-CM | POA: Diagnosis not present

## 2020-08-23 DIAGNOSIS — M1711 Unilateral primary osteoarthritis, right knee: Secondary | ICD-10-CM | POA: Diagnosis not present

## 2020-08-23 DIAGNOSIS — E782 Mixed hyperlipidemia: Secondary | ICD-10-CM | POA: Diagnosis not present

## 2020-08-23 DIAGNOSIS — E1142 Type 2 diabetes mellitus with diabetic polyneuropathy: Secondary | ICD-10-CM | POA: Diagnosis not present

## 2020-08-23 DIAGNOSIS — I2510 Atherosclerotic heart disease of native coronary artery without angina pectoris: Secondary | ICD-10-CM | POA: Diagnosis not present

## 2020-08-23 DIAGNOSIS — E1165 Type 2 diabetes mellitus with hyperglycemia: Secondary | ICD-10-CM | POA: Diagnosis not present

## 2020-08-23 DIAGNOSIS — M1712 Unilateral primary osteoarthritis, left knee: Secondary | ICD-10-CM | POA: Diagnosis not present

## 2020-08-23 DIAGNOSIS — I10 Essential (primary) hypertension: Secondary | ICD-10-CM | POA: Diagnosis not present

## 2020-09-04 DIAGNOSIS — M4727 Other spondylosis with radiculopathy, lumbosacral region: Secondary | ICD-10-CM | POA: Diagnosis not present

## 2020-09-04 DIAGNOSIS — M5416 Radiculopathy, lumbar region: Secondary | ICD-10-CM | POA: Diagnosis not present

## 2020-09-18 DIAGNOSIS — Z23 Encounter for immunization: Secondary | ICD-10-CM | POA: Diagnosis not present

## 2020-09-18 DIAGNOSIS — Z0000 Encounter for general adult medical examination without abnormal findings: Secondary | ICD-10-CM | POA: Diagnosis not present

## 2020-09-29 ENCOUNTER — Other Ambulatory Visit: Payer: Self-pay

## 2020-09-29 ENCOUNTER — Ambulatory Visit (INDEPENDENT_AMBULATORY_CARE_PROVIDER_SITE_OTHER): Payer: Medicare Other | Admitting: Sports Medicine

## 2020-09-29 ENCOUNTER — Encounter: Payer: Self-pay | Admitting: Sports Medicine

## 2020-09-29 DIAGNOSIS — M2041 Other hammer toe(s) (acquired), right foot: Secondary | ICD-10-CM

## 2020-09-29 DIAGNOSIS — M79674 Pain in right toe(s): Secondary | ICD-10-CM

## 2020-09-29 DIAGNOSIS — M79675 Pain in left toe(s): Secondary | ICD-10-CM

## 2020-09-29 DIAGNOSIS — B351 Tinea unguium: Secondary | ICD-10-CM | POA: Diagnosis not present

## 2020-09-29 DIAGNOSIS — L84 Corns and callosities: Secondary | ICD-10-CM

## 2020-09-29 DIAGNOSIS — M2042 Other hammer toe(s) (acquired), left foot: Secondary | ICD-10-CM

## 2020-09-29 DIAGNOSIS — E1142 Type 2 diabetes mellitus with diabetic polyneuropathy: Secondary | ICD-10-CM

## 2020-09-29 NOTE — Progress Notes (Signed)
Subjective: Brittany Rojas is a 75 y.o. female patient with history of diabetes who presents to office today complaining of painful callus and nails; unable to trim. No other pedal complaints.   Fasting blood sugar not recorded.   Patient Active Problem List   Diagnosis Date Noted   Acquired hammer toe of left foot 08/04/2020   Acquired trigger finger 08/04/2020   Arthritis of left knee 08/04/2020   Artificial knee joint present 08/04/2020   Bronchospasm 08/04/2020   Bunion of right foot 08/04/2020   Foot callus 08/04/2020   Gastroenteritis 08/04/2020   Impacted cerumen 08/04/2020   Neuropathy 08/04/2020   Other seasonal allergic rhinitis 08/04/2020   Polyneuropathy due to type 2 diabetes mellitus ([place]) 08/04/2020   Type 2 diabetes mellitus without complications ([place]) 83/41/9622   Vertigo 08/04/2020   Vitamin D deficiency 08/04/2020   Displacement of lumbar intervertebral disc without myelopathy 06/14/2020   Knee pain, left 06/14/2020   Diabetic neuropathy ([place]) 03/30/2020   Hyperglycemia due to type 2 diabetes mellitus ([place]) 03/30/2020   Pure hypercholesterolemia 03/30/2020   Shoulder pain 08/01/2014   Chronic low back pain 12/21/2013   Lumbosacral spondylosis with radiculopathy 12/21/2013   Lumbar radiculopathy 10/11/2013   Coronary atherosclerosis of native coronary artery 12/08/2012   DM (diabetes mellitus) ([place]) 02/28/2012   Sleep apnea 10/29/2011   NEUROPATHY 06/14/2008   Hyperlipidemia 04/29/2007   EXOGENOUS OBESITY 04/29/2007   DEPRESSION 04/29/2007   Essential hypertension 04/29/2007   PERIPHERAL EDEMA 04/29/2007   History of cardiovascular disorder 04/29/2007   DIVERTICULITIS, HX OF 04/29/2007   Current Outpatient Medications on File Prior to Visit  Medication Sig Dispense Refill   albuterol (PROAIR HFA) 108 (90 Base) MCG/ACT inhaler 2 puffs as needed     amLODipine (NORVASC) 5 MG tablet Take 5 mg by mouth daily.     aspirin 81 MG tablet Take 81 mg by mouth  daily.     atorvastatin (LIPITOR) 80 MG tablet Take 80 mg by mouth daily.      Bimatoprost (LUMIGAN OP) Place 1 drop into both eyes at bedtime.     clindamycin (CLEOCIN) 150 MG capsule SMARTSIG:4 Capsule(s) By Mouth Once     COMBIGAN 0.2-0.5 % ophthalmic solution Place 1 drop into both eyes 2 (two) times daily.  2   Continuous Blood Gluc Sensor (FREESTYLE LIBRE 14 DAY SENSOR) MISC 1 each by Does not apply route every 14 (fourteen) days. 2 each 5   diclofenac sodium (VOLTAREN) 1 % GEL APPLY 2 GRAMS TO AREA UP TO 4 TIMES DAILY     gabapentin (NEURONTIN) 300 MG capsule TAKE 3 CAPSULES BY MOUTH AT BEDTIME 270 capsule 3   hydrochlorothiazide (HYDRODIURIL) 25 MG tablet Take 25 mg by mouth daily.     insulin NPH Human (HUMULIN N,NOVOLIN N) 100 UNIT/ML injection Inject 10 units into the skin each morning and 65 units into the skin each evening.     insulin regular (NOVOLIN R,HUMULIN R) 100 units/mL injection Use as directed per scale three (3) times daily.     latanoprost (XALATAN) 0.005 % ophthalmic solution INSTILL 1 DROP INTO EACH EYE AT NIGHT OR IN THE EVENING AS DIRECTED     losartan (COZAAR) 100 MG tablet Take 100 mg by mouth daily.     Multiple Vitamins-Minerals (MULTI FOR HER 50+) TABS See admin instructions.     Multiple Vitamins-Minerals (MULTIVITAMIN PO) Take 1 tablet by mouth daily.     nitroGLYCERIN (NITROSTAT) 0.4 MG SL tablet Place 1 tablet (  0.4 mg total) under the tongue every 5 (five) minutes as needed for chest pain. 25 tablet 3   oxyCODONE (OXY IR/ROXICODONE) 5 MG immediate release tablet TAKE 1 TABLET BY MOUTH EVERY 4 TO 6 HOURS AS NEEDED FOR MODERATE TO SEVERE PAIN     traMADol (ULTRAM) 50 MG tablet Take one (1) tablet (50 mg) by mouth every eight (8) to twelve (12) hours as needed for pain.  5   traMADol (ULTRAM) 50 MG tablet Take by mouth.     [DISCONTINUED] telmisartan-hydrochlorothiazide (MICARDIS HCT) 80-12.5 MG per tablet Take 1 tablet by mouth daily.       No current  facility-administered medications on file prior to visit.   Allergies  Allergen Reactions   Erythromycin Other (See Comments)    "upset stomach" (02/17/2012)   Penicillins Other (See Comments)    ">50 yr ago, I had strept throat; had gotten lots and lots of PCN; last dose caused me to stop breathing" (02/17/2012)   Tetracycline Other (See Comments)    "upset stomach" (02/17/2012)   Sulfonamide Derivatives Rash and Other (See Comments)    "upset stomach" (02/17/2012)   Guaifenesin Er Other (See Comments)   Morphine Nausea And Vomiting   Sulfa Antibiotics Other (See Comments)   Sulfur     No results found for this or any previous visit (from the past 2160 hour(s)).  Objective: General: Patient is awake, alert, and oriented x 3 and in no acute distress.  Integument: Skin is warm, dry and supple bilateral. Nails long and, thickened and dystrophic with subungual debris, consistent with onychomycosis, 1-5 bilateral. No signs of infection.  Callus right sub-met 2 and left medial bunion.  Remaining integument unremarkable.  Vasculature:  Dorsalis Pedis pulse 1/4 bilateral. Posterior Tibial pulse  1/4 bilateral. Capillary fill time <3 sec 1-5 bilateral.  Scant hair growth to the level of the digits.Temperature gradient within normal limits.  Moderate varicosities present bilateral. No edema present bilateral.   Neurology: Unchanged from prior.   Musculoskeletal: Bunion and hammertoe with pes planus pedal deformities noted bilateral. Muscular strength 5/5 in all lower extremity muscular groups bilateral without pain on range of motion . No tenderness with calf compression bilateral.  Assessment and Plan: Problem List Items Addressed This Visit       Endocrine   Diabetic neuropathy ([place])   Other Visit Diagnoses     Pain due to onychomycosis of toenails of both feet    -  Primary   Callus of foot       Acquired hammertoes of both feet          -Examined patient. -Re-discussed the  importance of daily foot inspection setting diabetes -Mechanically debrided all nails using a sterile nail nipper and debrided callus x2 at right sub-met 2 and left medial bunion using sterile chisel blade and filed with dremel without incident; applied saliocaine and bandaid to right -Continue with diabetic shoes, added additional offloading padding to area -Patient to return in 62 days for nail and callus care  -Patient advised to call the office if any problems or questions arise in the meantime.  Landis Martins, DPM

## 2020-10-11 DIAGNOSIS — E1165 Type 2 diabetes mellitus with hyperglycemia: Secondary | ICD-10-CM | POA: Diagnosis not present

## 2020-10-11 DIAGNOSIS — I10 Essential (primary) hypertension: Secondary | ICD-10-CM | POA: Diagnosis not present

## 2020-10-11 DIAGNOSIS — M1712 Unilateral primary osteoarthritis, left knee: Secondary | ICD-10-CM | POA: Diagnosis not present

## 2020-10-11 DIAGNOSIS — M1711 Unilateral primary osteoarthritis, right knee: Secondary | ICD-10-CM | POA: Diagnosis not present

## 2020-10-11 DIAGNOSIS — E119 Type 2 diabetes mellitus without complications: Secondary | ICD-10-CM | POA: Diagnosis not present

## 2020-10-11 DIAGNOSIS — I2510 Atherosclerotic heart disease of native coronary artery without angina pectoris: Secondary | ICD-10-CM | POA: Diagnosis not present

## 2020-10-11 DIAGNOSIS — E782 Mixed hyperlipidemia: Secondary | ICD-10-CM | POA: Diagnosis not present

## 2020-10-11 DIAGNOSIS — E1142 Type 2 diabetes mellitus with diabetic polyneuropathy: Secondary | ICD-10-CM | POA: Diagnosis not present

## 2020-10-13 DIAGNOSIS — M1712 Unilateral primary osteoarthritis, left knee: Secondary | ICD-10-CM | POA: Diagnosis not present

## 2020-10-13 DIAGNOSIS — Z96652 Presence of left artificial knee joint: Secondary | ICD-10-CM | POA: Diagnosis not present

## 2020-10-14 DIAGNOSIS — U071 COVID-19: Secondary | ICD-10-CM | POA: Diagnosis not present

## 2020-11-07 ENCOUNTER — Ambulatory Visit: Payer: Medicare Other | Admitting: Sports Medicine

## 2020-11-09 DIAGNOSIS — M5416 Radiculopathy, lumbar region: Secondary | ICD-10-CM | POA: Diagnosis not present

## 2020-11-09 DIAGNOSIS — Z6835 Body mass index (BMI) 35.0-35.9, adult: Secondary | ICD-10-CM | POA: Diagnosis not present

## 2020-11-09 DIAGNOSIS — Z5181 Encounter for therapeutic drug level monitoring: Secondary | ICD-10-CM | POA: Diagnosis not present

## 2020-11-09 DIAGNOSIS — I10 Essential (primary) hypertension: Secondary | ICD-10-CM | POA: Diagnosis not present

## 2020-11-09 DIAGNOSIS — M4727 Other spondylosis with radiculopathy, lumbosacral region: Secondary | ICD-10-CM | POA: Diagnosis not present

## 2020-11-10 DIAGNOSIS — L97911 Non-pressure chronic ulcer of unspecified part of right lower leg limited to breakdown of skin: Secondary | ICD-10-CM | POA: Diagnosis not present

## 2020-11-10 DIAGNOSIS — D485 Neoplasm of uncertain behavior of skin: Secondary | ICD-10-CM | POA: Diagnosis not present

## 2020-11-10 DIAGNOSIS — L821 Other seborrheic keratosis: Secondary | ICD-10-CM | POA: Diagnosis not present

## 2020-11-10 DIAGNOSIS — Z23 Encounter for immunization: Secondary | ICD-10-CM | POA: Diagnosis not present

## 2020-11-24 DIAGNOSIS — Z23 Encounter for immunization: Secondary | ICD-10-CM | POA: Diagnosis not present

## 2020-11-27 DIAGNOSIS — L97911 Non-pressure chronic ulcer of unspecified part of right lower leg limited to breakdown of skin: Secondary | ICD-10-CM | POA: Diagnosis not present

## 2020-12-08 DIAGNOSIS — E785 Hyperlipidemia, unspecified: Secondary | ICD-10-CM | POA: Diagnosis present

## 2020-12-08 DIAGNOSIS — R531 Weakness: Secondary | ICD-10-CM | POA: Diagnosis not present

## 2020-12-08 DIAGNOSIS — R059 Cough, unspecified: Secondary | ICD-10-CM | POA: Diagnosis not present

## 2020-12-08 DIAGNOSIS — R509 Fever, unspecified: Secondary | ICD-10-CM | POA: Diagnosis not present

## 2020-12-08 DIAGNOSIS — J181 Lobar pneumonia, unspecified organism: Secondary | ICD-10-CM | POA: Diagnosis not present

## 2020-12-08 DIAGNOSIS — Z87891 Personal history of nicotine dependence: Secondary | ICD-10-CM | POA: Diagnosis not present

## 2020-12-08 DIAGNOSIS — R0989 Other specified symptoms and signs involving the circulatory and respiratory systems: Secondary | ICD-10-CM | POA: Diagnosis not present

## 2020-12-08 DIAGNOSIS — Z7982 Long term (current) use of aspirin: Secondary | ICD-10-CM | POA: Diagnosis not present

## 2020-12-08 DIAGNOSIS — Z79899 Other long term (current) drug therapy: Secondary | ICD-10-CM | POA: Diagnosis not present

## 2020-12-08 DIAGNOSIS — D649 Anemia, unspecified: Secondary | ICD-10-CM | POA: Diagnosis present

## 2020-12-08 DIAGNOSIS — Z20828 Contact with and (suspected) exposure to other viral communicable diseases: Secondary | ICD-10-CM | POA: Diagnosis not present

## 2020-12-08 DIAGNOSIS — Z794 Long term (current) use of insulin: Secondary | ICD-10-CM | POA: Diagnosis not present

## 2020-12-08 DIAGNOSIS — Z888 Allergy status to other drugs, medicaments and biological substances status: Secondary | ICD-10-CM | POA: Diagnosis not present

## 2020-12-08 DIAGNOSIS — R051 Acute cough: Secondary | ICD-10-CM | POA: Diagnosis not present

## 2020-12-08 DIAGNOSIS — R0902 Hypoxemia: Secondary | ICD-10-CM | POA: Diagnosis not present

## 2020-12-08 DIAGNOSIS — Z882 Allergy status to sulfonamides status: Secondary | ICD-10-CM | POA: Diagnosis not present

## 2020-12-08 DIAGNOSIS — I252 Old myocardial infarction: Secondary | ICD-10-CM | POA: Diagnosis not present

## 2020-12-08 DIAGNOSIS — R0602 Shortness of breath: Secondary | ICD-10-CM | POA: Diagnosis not present

## 2020-12-08 DIAGNOSIS — E119 Type 2 diabetes mellitus without complications: Secondary | ICD-10-CM | POA: Diagnosis present

## 2020-12-08 DIAGNOSIS — J189 Pneumonia, unspecified organism: Secondary | ICD-10-CM | POA: Diagnosis not present

## 2020-12-08 DIAGNOSIS — G4733 Obstructive sleep apnea (adult) (pediatric): Secondary | ICD-10-CM | POA: Diagnosis not present

## 2020-12-08 DIAGNOSIS — M1990 Unspecified osteoarthritis, unspecified site: Secondary | ICD-10-CM | POA: Diagnosis present

## 2020-12-08 DIAGNOSIS — J45909 Unspecified asthma, uncomplicated: Secondary | ICD-10-CM | POA: Diagnosis not present

## 2020-12-08 DIAGNOSIS — I10 Essential (primary) hypertension: Secondary | ICD-10-CM | POA: Diagnosis not present

## 2020-12-08 DIAGNOSIS — I2510 Atherosclerotic heart disease of native coronary artery without angina pectoris: Secondary | ICD-10-CM | POA: Diagnosis present

## 2020-12-08 DIAGNOSIS — Z792 Long term (current) use of antibiotics: Secondary | ICD-10-CM | POA: Diagnosis not present

## 2020-12-09 DIAGNOSIS — I10 Essential (primary) hypertension: Secondary | ICD-10-CM | POA: Diagnosis not present

## 2020-12-09 DIAGNOSIS — E785 Hyperlipidemia, unspecified: Secondary | ICD-10-CM | POA: Diagnosis not present

## 2020-12-09 DIAGNOSIS — G4733 Obstructive sleep apnea (adult) (pediatric): Secondary | ICD-10-CM | POA: Diagnosis not present

## 2020-12-09 DIAGNOSIS — M1990 Unspecified osteoarthritis, unspecified site: Secondary | ICD-10-CM | POA: Diagnosis not present

## 2020-12-09 DIAGNOSIS — D649 Anemia, unspecified: Secondary | ICD-10-CM | POA: Diagnosis not present

## 2020-12-09 DIAGNOSIS — J189 Pneumonia, unspecified organism: Secondary | ICD-10-CM | POA: Diagnosis not present

## 2020-12-11 DIAGNOSIS — N289 Disorder of kidney and ureter, unspecified: Secondary | ICD-10-CM | POA: Diagnosis not present

## 2020-12-11 DIAGNOSIS — I10 Essential (primary) hypertension: Secondary | ICD-10-CM | POA: Diagnosis not present

## 2020-12-11 DIAGNOSIS — R4182 Altered mental status, unspecified: Secondary | ICD-10-CM | POA: Diagnosis not present

## 2020-12-11 DIAGNOSIS — R404 Transient alteration of awareness: Secondary | ICD-10-CM | POA: Diagnosis not present

## 2020-12-11 DIAGNOSIS — R001 Bradycardia, unspecified: Secondary | ICD-10-CM | POA: Diagnosis not present

## 2020-12-11 DIAGNOSIS — J189 Pneumonia, unspecified organism: Secondary | ICD-10-CM | POA: Diagnosis not present

## 2020-12-11 DIAGNOSIS — E11649 Type 2 diabetes mellitus with hypoglycemia without coma: Secondary | ICD-10-CM | POA: Diagnosis not present

## 2020-12-11 DIAGNOSIS — R0902 Hypoxemia: Secondary | ICD-10-CM | POA: Diagnosis not present

## 2020-12-11 DIAGNOSIS — R410 Disorientation, unspecified: Secondary | ICD-10-CM | POA: Diagnosis not present

## 2020-12-11 DIAGNOSIS — R531 Weakness: Secondary | ICD-10-CM | POA: Diagnosis not present

## 2020-12-12 DIAGNOSIS — G8929 Other chronic pain: Secondary | ICD-10-CM | POA: Diagnosis not present

## 2020-12-12 DIAGNOSIS — I2510 Atherosclerotic heart disease of native coronary artery without angina pectoris: Secondary | ICD-10-CM | POA: Diagnosis present

## 2020-12-12 DIAGNOSIS — E162 Hypoglycemia, unspecified: Secondary | ICD-10-CM | POA: Diagnosis not present

## 2020-12-12 DIAGNOSIS — Z955 Presence of coronary angioplasty implant and graft: Secondary | ICD-10-CM | POA: Diagnosis not present

## 2020-12-12 DIAGNOSIS — R2681 Unsteadiness on feet: Secondary | ICD-10-CM | POA: Diagnosis not present

## 2020-12-12 DIAGNOSIS — R279 Unspecified lack of coordination: Secondary | ICD-10-CM | POA: Diagnosis not present

## 2020-12-12 DIAGNOSIS — R531 Weakness: Secondary | ICD-10-CM | POA: Diagnosis not present

## 2020-12-12 DIAGNOSIS — Z880 Allergy status to penicillin: Secondary | ICD-10-CM | POA: Diagnosis not present

## 2020-12-12 DIAGNOSIS — Z6830 Body mass index (BMI) 30.0-30.9, adult: Secondary | ICD-10-CM | POA: Diagnosis not present

## 2020-12-12 DIAGNOSIS — Z882 Allergy status to sulfonamides status: Secondary | ICD-10-CM | POA: Diagnosis not present

## 2020-12-12 DIAGNOSIS — Z743 Need for continuous supervision: Secondary | ICD-10-CM | POA: Diagnosis not present

## 2020-12-12 DIAGNOSIS — Z794 Long term (current) use of insulin: Secondary | ICD-10-CM | POA: Diagnosis not present

## 2020-12-12 DIAGNOSIS — E11649 Type 2 diabetes mellitus with hypoglycemia without coma: Secondary | ICD-10-CM | POA: Diagnosis not present

## 2020-12-12 DIAGNOSIS — G4733 Obstructive sleep apnea (adult) (pediatric): Secondary | ICD-10-CM | POA: Diagnosis present

## 2020-12-12 DIAGNOSIS — E1169 Type 2 diabetes mellitus with other specified complication: Secondary | ICD-10-CM | POA: Diagnosis not present

## 2020-12-12 DIAGNOSIS — Z9989 Dependence on other enabling machines and devices: Secondary | ICD-10-CM | POA: Diagnosis not present

## 2020-12-12 DIAGNOSIS — J209 Acute bronchitis, unspecified: Secondary | ICD-10-CM | POA: Diagnosis not present

## 2020-12-12 DIAGNOSIS — R059 Cough, unspecified: Secondary | ICD-10-CM | POA: Diagnosis not present

## 2020-12-12 DIAGNOSIS — R0902 Hypoxemia: Secondary | ICD-10-CM | POA: Diagnosis not present

## 2020-12-12 DIAGNOSIS — E860 Dehydration: Secondary | ICD-10-CM | POA: Diagnosis not present

## 2020-12-12 DIAGNOSIS — R410 Disorientation, unspecified: Secondary | ICD-10-CM | POA: Diagnosis not present

## 2020-12-12 DIAGNOSIS — N289 Disorder of kidney and ureter, unspecified: Secondary | ICD-10-CM | POA: Diagnosis not present

## 2020-12-12 DIAGNOSIS — G9341 Metabolic encephalopathy: Secondary | ICD-10-CM | POA: Diagnosis not present

## 2020-12-12 DIAGNOSIS — J45909 Unspecified asthma, uncomplicated: Secondary | ICD-10-CM | POA: Diagnosis not present

## 2020-12-12 DIAGNOSIS — Z792 Long term (current) use of antibiotics: Secondary | ICD-10-CM | POA: Diagnosis not present

## 2020-12-12 DIAGNOSIS — M6281 Muscle weakness (generalized): Secondary | ICD-10-CM | POA: Diagnosis not present

## 2020-12-12 DIAGNOSIS — N179 Acute kidney failure, unspecified: Secondary | ICD-10-CM | POA: Diagnosis not present

## 2020-12-12 DIAGNOSIS — Z7901 Long term (current) use of anticoagulants: Secondary | ICD-10-CM | POA: Diagnosis not present

## 2020-12-12 DIAGNOSIS — E669 Obesity, unspecified: Secondary | ICD-10-CM | POA: Diagnosis not present

## 2020-12-12 DIAGNOSIS — R55 Syncope and collapse: Secondary | ICD-10-CM | POA: Diagnosis not present

## 2020-12-12 DIAGNOSIS — I10 Essential (primary) hypertension: Secondary | ICD-10-CM | POA: Diagnosis not present

## 2020-12-12 DIAGNOSIS — Z888 Allergy status to other drugs, medicaments and biological substances status: Secondary | ICD-10-CM | POA: Diagnosis not present

## 2020-12-12 DIAGNOSIS — J189 Pneumonia, unspecified organism: Secondary | ICD-10-CM | POA: Diagnosis not present

## 2020-12-12 DIAGNOSIS — I252 Old myocardial infarction: Secondary | ICD-10-CM | POA: Diagnosis not present

## 2020-12-12 DIAGNOSIS — M1990 Unspecified osteoarthritis, unspecified site: Secondary | ICD-10-CM | POA: Diagnosis not present

## 2020-12-12 DIAGNOSIS — Z7952 Long term (current) use of systemic steroids: Secondary | ICD-10-CM | POA: Diagnosis not present

## 2020-12-12 DIAGNOSIS — G894 Chronic pain syndrome: Secondary | ICD-10-CM | POA: Diagnosis not present

## 2020-12-12 DIAGNOSIS — M792 Neuralgia and neuritis, unspecified: Secondary | ICD-10-CM | POA: Diagnosis not present

## 2020-12-12 DIAGNOSIS — H409 Unspecified glaucoma: Secondary | ICD-10-CM | POA: Diagnosis not present

## 2020-12-12 DIAGNOSIS — R4182 Altered mental status, unspecified: Secondary | ICD-10-CM | POA: Diagnosis not present

## 2020-12-12 DIAGNOSIS — I959 Hypotension, unspecified: Secondary | ICD-10-CM | POA: Diagnosis not present

## 2020-12-12 DIAGNOSIS — J9601 Acute respiratory failure with hypoxia: Secondary | ICD-10-CM | POA: Diagnosis not present

## 2020-12-13 ENCOUNTER — Other Ambulatory Visit: Payer: Self-pay | Admitting: *Deleted

## 2020-12-13 NOTE — Patient Outreach (Signed)
[facility]) Care Management  12/13/2020  Brittany Rojas 08-16-1945 [id number]   Referral Date: 11/1 Referral Source: Data Analysis Referral Reason: Recent hospital discharge Insurance: DCE   Outreach attempt #1, unsuccessful, HIPAA compliant voice message left.    Plan: RN CM will send outreach letter and follow up within the next 3-5 business days.  Valente David, [place], MSN [place] [phone]

## 2020-12-15 DIAGNOSIS — R059 Cough, unspecified: Secondary | ICD-10-CM | POA: Diagnosis not present

## 2020-12-15 DIAGNOSIS — E162 Hypoglycemia, unspecified: Secondary | ICD-10-CM | POA: Diagnosis not present

## 2020-12-15 DIAGNOSIS — R4182 Altered mental status, unspecified: Secondary | ICD-10-CM | POA: Diagnosis not present

## 2020-12-15 DIAGNOSIS — Z7901 Long term (current) use of anticoagulants: Secondary | ICD-10-CM | POA: Diagnosis not present

## 2020-12-15 DIAGNOSIS — Z6830 Body mass index (BMI) 30.0-30.9, adult: Secondary | ICD-10-CM | POA: Diagnosis not present

## 2020-12-15 DIAGNOSIS — R279 Unspecified lack of coordination: Secondary | ICD-10-CM | POA: Diagnosis not present

## 2020-12-15 DIAGNOSIS — R2681 Unsteadiness on feet: Secondary | ICD-10-CM | POA: Diagnosis not present

## 2020-12-15 DIAGNOSIS — Z794 Long term (current) use of insulin: Secondary | ICD-10-CM | POA: Diagnosis not present

## 2020-12-15 DIAGNOSIS — E1169 Type 2 diabetes mellitus with other specified complication: Secondary | ICD-10-CM | POA: Diagnosis not present

## 2020-12-15 DIAGNOSIS — J189 Pneumonia, unspecified organism: Secondary | ICD-10-CM | POA: Diagnosis not present

## 2020-12-15 DIAGNOSIS — H409 Unspecified glaucoma: Secondary | ICD-10-CM | POA: Diagnosis not present

## 2020-12-15 DIAGNOSIS — M6281 Muscle weakness (generalized): Secondary | ICD-10-CM | POA: Diagnosis not present

## 2020-12-15 DIAGNOSIS — N179 Acute kidney failure, unspecified: Secondary | ICD-10-CM | POA: Diagnosis not present

## 2020-12-15 DIAGNOSIS — Z9989 Dependence on other enabling machines and devices: Secondary | ICD-10-CM | POA: Diagnosis not present

## 2020-12-15 DIAGNOSIS — G894 Chronic pain syndrome: Secondary | ICD-10-CM | POA: Diagnosis not present

## 2020-12-15 DIAGNOSIS — G9341 Metabolic encephalopathy: Secondary | ICD-10-CM | POA: Diagnosis not present

## 2020-12-15 DIAGNOSIS — I10 Essential (primary) hypertension: Secondary | ICD-10-CM | POA: Diagnosis not present

## 2020-12-15 DIAGNOSIS — E11649 Type 2 diabetes mellitus with hypoglycemia without coma: Secondary | ICD-10-CM | POA: Diagnosis not present

## 2020-12-15 DIAGNOSIS — E119 Type 2 diabetes mellitus without complications: Secondary | ICD-10-CM | POA: Diagnosis not present

## 2020-12-15 DIAGNOSIS — G4733 Obstructive sleep apnea (adult) (pediatric): Secondary | ICD-10-CM | POA: Diagnosis not present

## 2020-12-15 DIAGNOSIS — R55 Syncope and collapse: Secondary | ICD-10-CM | POA: Diagnosis not present

## 2020-12-15 DIAGNOSIS — Z743 Need for continuous supervision: Secondary | ICD-10-CM | POA: Diagnosis not present

## 2020-12-15 DIAGNOSIS — I959 Hypotension, unspecified: Secondary | ICD-10-CM | POA: Diagnosis not present

## 2020-12-15 DIAGNOSIS — M792 Neuralgia and neuritis, unspecified: Secondary | ICD-10-CM | POA: Diagnosis not present

## 2020-12-15 DIAGNOSIS — E669 Obesity, unspecified: Secondary | ICD-10-CM | POA: Diagnosis not present

## 2020-12-16 DIAGNOSIS — J189 Pneumonia, unspecified organism: Secondary | ICD-10-CM | POA: Diagnosis not present

## 2020-12-16 DIAGNOSIS — E119 Type 2 diabetes mellitus without complications: Secondary | ICD-10-CM | POA: Diagnosis not present

## 2020-12-16 DIAGNOSIS — I10 Essential (primary) hypertension: Secondary | ICD-10-CM | POA: Diagnosis not present

## 2020-12-16 DIAGNOSIS — G4733 Obstructive sleep apnea (adult) (pediatric): Secondary | ICD-10-CM | POA: Diagnosis not present

## 2020-12-20 ENCOUNTER — Other Ambulatory Visit: Payer: Self-pay | Admitting: *Deleted

## 2020-12-20 NOTE — Patient Outreach (Signed)
[facility]) Care Management  12/20/2020  SANDREA BOER 21-Sep-1945 [id number]   Referral Date: 11/1 Referral Source: Data Analysis Referral Reason: Recent hospital discharge Insurance: DCE     Outreach attempt #2, successful.  Member state she is at [facility] for rehab, has not discharged home yet.  Plan: RN CM will notify post acute care coordinator that member currently resides at [facility].  Will await notification that member has discharged back home.  Valente David, [place], MSN [place] [phone]

## 2020-12-21 ENCOUNTER — Other Ambulatory Visit: Payer: Self-pay | Admitting: *Deleted

## 2020-12-21 NOTE — Patient Outreach (Signed)
Made aware by [facility] RNCM that Brittany Rojas will be followed post SNF stay. Brittany Rojas currently resides in [facility].   Notification sent to [facility] SW to make aware writer is following for transition plans.  Will continue to follow while member resides in SNF. Will keep [facility] RNCM updated.   [name], MSN, RN,BSN [place] Acute Care Coordinator [phone] [facility]) [phone]  (Toll free office)

## 2021-01-02 DIAGNOSIS — Z794 Long term (current) use of insulin: Secondary | ICD-10-CM | POA: Diagnosis not present

## 2021-01-02 DIAGNOSIS — G4733 Obstructive sleep apnea (adult) (pediatric): Secondary | ICD-10-CM | POA: Diagnosis not present

## 2021-01-02 DIAGNOSIS — Z7952 Long term (current) use of systemic steroids: Secondary | ICD-10-CM | POA: Diagnosis not present

## 2021-01-02 DIAGNOSIS — G894 Chronic pain syndrome: Secondary | ICD-10-CM | POA: Diagnosis not present

## 2021-01-02 DIAGNOSIS — J188 Other pneumonia, unspecified organism: Secondary | ICD-10-CM | POA: Diagnosis not present

## 2021-01-02 DIAGNOSIS — H409 Unspecified glaucoma: Secondary | ICD-10-CM | POA: Diagnosis not present

## 2021-01-02 DIAGNOSIS — I10 Essential (primary) hypertension: Secondary | ICD-10-CM | POA: Diagnosis not present

## 2021-01-02 DIAGNOSIS — L84 Corns and callosities: Secondary | ICD-10-CM | POA: Diagnosis not present

## 2021-01-02 DIAGNOSIS — Z7982 Long term (current) use of aspirin: Secondary | ICD-10-CM | POA: Diagnosis not present

## 2021-01-02 DIAGNOSIS — E119 Type 2 diabetes mellitus without complications: Secondary | ICD-10-CM | POA: Diagnosis not present

## 2021-01-02 DIAGNOSIS — E860 Dehydration: Secondary | ICD-10-CM | POA: Diagnosis not present

## 2021-01-03 ENCOUNTER — Encounter: Payer: Self-pay | Admitting: Sports Medicine

## 2021-01-03 ENCOUNTER — Ambulatory Visit (INDEPENDENT_AMBULATORY_CARE_PROVIDER_SITE_OTHER): Payer: Medicare Other | Admitting: Sports Medicine

## 2021-01-03 DIAGNOSIS — M79674 Pain in right toe(s): Secondary | ICD-10-CM

## 2021-01-03 DIAGNOSIS — M79675 Pain in left toe(s): Secondary | ICD-10-CM

## 2021-01-03 DIAGNOSIS — M2041 Other hammer toe(s) (acquired), right foot: Secondary | ICD-10-CM

## 2021-01-03 DIAGNOSIS — M2142 Flat foot [pes planus] (acquired), left foot: Secondary | ICD-10-CM

## 2021-01-03 DIAGNOSIS — M2141 Flat foot [pes planus] (acquired), right foot: Secondary | ICD-10-CM

## 2021-01-03 DIAGNOSIS — B351 Tinea unguium: Secondary | ICD-10-CM

## 2021-01-03 DIAGNOSIS — M2042 Other hammer toe(s) (acquired), left foot: Secondary | ICD-10-CM

## 2021-01-03 DIAGNOSIS — L84 Corns and callosities: Secondary | ICD-10-CM

## 2021-01-03 DIAGNOSIS — E1142 Type 2 diabetes mellitus with diabetic polyneuropathy: Secondary | ICD-10-CM

## 2021-01-03 NOTE — Progress Notes (Signed)
Subjective: Brittany Rojas is a 75 y.o. female patient with history of diabetes who presents to office today complaining of painful callus and nails; unable to trim.  Reports that his callus is sore.  Patient states that after last visit she had an episode where she got really sick and had to go to the hospital.  1 day in the hospital and was discharged and then a few days later became unresponsive and was admitted to the hospital and then secondarily went to [facility].  Patient denies any other changes with health history since last encounter.   Fasting blood sugar 98 last A1c 6.5 and last visit to PCP was 3 months ago but has a follow-up appointment again on next week.  Patient Active Problem List   Diagnosis Date Noted   Acquired hammer toe of left foot 08/04/2020   Acquired trigger finger 08/04/2020   Arthritis of left knee 08/04/2020   Artificial knee joint present 08/04/2020   Bronchospasm 08/04/2020   Bunion of right foot 08/04/2020   Foot callus 08/04/2020   Gastroenteritis 08/04/2020   Impacted cerumen 08/04/2020   Neuropathy 08/04/2020   Other seasonal allergic rhinitis 08/04/2020   Polyneuropathy due to type 2 diabetes mellitus ([place]) 08/04/2020   Type 2 diabetes mellitus without complications ([place]) 48/25/0037   Vertigo 08/04/2020   Vitamin D deficiency 08/04/2020   Displacement of lumbar intervertebral disc without myelopathy 06/14/2020   Knee pain, left 06/14/2020   Diabetic neuropathy ([place]) 03/30/2020   Hyperglycemia due to type 2 diabetes mellitus ([place]) 03/30/2020   Pure hypercholesterolemia 03/30/2020   Shoulder pain 08/01/2014   Chronic low back pain 12/21/2013   Lumbosacral spondylosis with radiculopathy 12/21/2013   Lumbar radiculopathy 10/11/2013   Coronary atherosclerosis of native coronary artery 12/08/2012   DM (diabetes mellitus) ([place]) 02/28/2012   Sleep apnea 10/29/2011   NEUROPATHY 06/14/2008   Hyperlipidemia 04/29/2007   EXOGENOUS OBESITY 04/29/2007    DEPRESSION 04/29/2007   Essential hypertension 04/29/2007   PERIPHERAL EDEMA 04/29/2007   History of cardiovascular disorder 04/29/2007   DIVERTICULITIS, HX OF 04/29/2007   Current Outpatient Medications on File Prior to Visit  Medication Sig Dispense Refill   albuterol (PROAIR HFA) 108 (90 Base) MCG/ACT inhaler 2 puffs as needed     amLODipine (NORVASC) 5 MG tablet Take 5 mg by mouth daily.     aspirin 81 MG tablet Take 81 mg by mouth daily.     atorvastatin (LIPITOR) 80 MG tablet Take 80 mg by mouth daily.      Bimatoprost (LUMIGAN OP) Place 1 drop into both eyes at bedtime.     clindamycin (CLEOCIN) 150 MG capsule SMARTSIG:4 Capsule(s) By Mouth Once     COMBIGAN 0.2-0.5 % ophthalmic solution Place 1 drop into both eyes 2 (two) times daily.  2   Continuous Blood Gluc Sensor (FREESTYLE LIBRE 14 DAY SENSOR) MISC 1 each by Does not apply route every 14 (fourteen) days. 2 each 5   diclofenac sodium (VOLTAREN) 1 % GEL APPLY 2 GRAMS TO AREA UP TO 4 TIMES DAILY     gabapentin (NEURONTIN) 300 MG capsule TAKE 3 CAPSULES BY MOUTH AT BEDTIME 270 capsule 3   hydrochlorothiazide (HYDRODIURIL) 25 MG tablet Take 25 mg by mouth daily.     insulin NPH Human (HUMULIN N,NOVOLIN N) 100 UNIT/ML injection Inject 10 units into the skin each morning and 65 units into the skin each evening.     insulin regular (NOVOLIN R,HUMULIN R) 100 units/mL injection Use as directed  per scale three (3) times daily.     latanoprost (XALATAN) 0.005 % ophthalmic solution INSTILL 1 DROP INTO EACH EYE AT NIGHT OR IN THE EVENING AS DIRECTED     losartan (COZAAR) 100 MG tablet Take 100 mg by mouth daily.     Multiple Vitamins-Minerals (MULTI FOR HER 50+) TABS See admin instructions.     Multiple Vitamins-Minerals (MULTIVITAMIN PO) Take 1 tablet by mouth daily.     nitroGLYCERIN (NITROSTAT) 0.4 MG SL tablet Place 1 tablet (0.4 mg total) under the tongue every 5 (five) minutes as needed for chest pain. 25 tablet 3   oxyCODONE (OXY  IR/ROXICODONE) 5 MG immediate release tablet TAKE 1 TABLET BY MOUTH EVERY 4 TO 6 HOURS AS NEEDED FOR MODERATE TO SEVERE PAIN     traMADol (ULTRAM) 50 MG tablet Take one (1) tablet (50 mg) by mouth every eight (8) to twelve (12) hours as needed for pain.  5   traMADol (ULTRAM) 50 MG tablet Take by mouth.     [DISCONTINUED] telmisartan-hydrochlorothiazide (MICARDIS HCT) 80-12.5 MG per tablet Take 1 tablet by mouth daily.       No current facility-administered medications on file prior to visit.   Allergies  Allergen Reactions   Erythromycin Other (See Comments)    "upset stomach" (02/17/2012)   Penicillins Other (See Comments)    ">50 yr ago, I had strept throat; had gotten lots and lots of PCN; last dose caused me to stop breathing" (02/17/2012)   Tetracycline Other (See Comments)    "upset stomach" (02/17/2012)   Sulfonamide Derivatives Rash and Other (See Comments)    "upset stomach" (02/17/2012)   Guaifenesin Er Other (See Comments)   Morphine Nausea And Vomiting   Sulfa Antibiotics Other (See Comments)   Sulfur     No results found for this or any previous visit (from the past 2160 hour(s)).  Objective: General: Patient is awake, alert, and oriented x 3 and in no acute distress.  Integument: Skin is warm, dry and supple bilateral. Nails long and, thickened and dystrophic with subungual debris, consistent with onychomycosis, 1-5 bilateral. No signs of infection.  Callus right sub-met 2 and left medial bunion.  Remaining integument unremarkable.  Vasculature:  Dorsalis Pedis pulse 1/4 bilateral. Posterior Tibial pulse  1/4 bilateral. Capillary fill time <3 sec 1-5 bilateral.  Scant hair growth to the level of the digits.Temperature gradient within normal limits.  Moderate varicosities present bilateral. No edema present bilateral.   Neurology: Unchanged from prior.   Musculoskeletal: Bunion and hammertoe with pes planus pedal deformities noted bilateral. Muscular strength 5/5 in all lower  extremity muscular groups bilateral without pain on range of motion . No tenderness with calf compression bilateral.  Assessment and Plan: Problem List Items Addressed This Visit       Endocrine   Diabetic neuropathy ([place])   Other Visit Diagnoses     Pain due to onychomycosis of toenails of both feet    -  Primary   Callus of foot       Acquired hammertoes of both feet       Pes planus of both feet          -Examined patient. -Re-discussed the importance of daily foot inspection setting diabetes -Mechanically debrided all nails using a sterile nail nipper and debrided callus x2 at right sub-met 2 and left medial bunion using sterile chisel blade and filed with dremel without incident; applied saliocaine and bandaid to right -Continue with diabetic shoes, added  additional offloading U-shaped padding to area -Patient to return in 62 days for nail and callus care or sooner problems or issues arise -Patient advised to call the office if any problems or questions arise in the meantime.  Landis Martins, DPM

## 2021-01-07 NOTE — Progress Notes (Signed)
Cardiology Office Note:    Date:  01/08/2021   ID:  Shamone, Winzer 1946/01/14, MRN [id number]  PCP:  [name], MD  Cardiologist:  Sinclair Grooms, MD   Referring MD: [name], MD   Chief Complaint  Patient presents with   Congestive Heart Failure   Coronary Artery Disease    History of Present Illness:    Brittany Rojas is a 75 y.o. female with a hx of obtuse marginal #1 DES 2014, hypertension, obstructive sleep apnea, and hyperlipidemia.  Recently completed a prolonged hospital stay in [place].  She was admitted with bilateral pneumonia urinary sepsis, encephalopathy, and hypoglycemia.  She was discharged to [facility] for facilitated rehabilitation.  She is doing much better.  The hospital stay was not complicated by cardiac issues.  She denies shortness of breath.  No current episodes of angina.  Kidney function was initially impaired.  At all have any data more recent than a creatinine of 0.8 on 12/16/2020.  Past Medical History:  Diagnosis Date   Bronchitis    "touch q now and then" (02/17/2012)   Depression    Diverticulitis    History of blood transfusion 1945   "when I was 66 days old" (02/17/2012)   History of transient ischemic attack (TIA) 2001   "possibly; never really pinned down what it was" (02/17/2012)   Hyperlipidemia    Hypertension    Migraines    "outgrew them I guess" (02/17/2012)   OSA on CPAP    Pneumonia 12/2010   Shortness of breath    "occasionally; at any time" (02/17/2012)   Type II diabetes mellitus ([place])     Past Surgical History:  Procedure Laterality Date   ABDOMINAL HYSTERECTOMY  2000's   APPENDECTOMY  04/1988   CARDIAC CATHETERIZATION  02/13/2012   COLECTOMY  01/18/1988   "took 3 feet out; result of lots of burst diverticula" (02/17/2012)   COLECTOMY  04/1988   "9 more inches of colon" (02/17/2012)   COLOSTOMY  01/18/1988   COLOSTOMY REVERSAL  04/1988   CORONARY ANGIOPLASTY WITH STENT PLACEMENT  02/17/2012   "1; first one  ever" (02/17/2012)   LEFT OOPHORECTOMY  1989   PERCUTANEOUS CORONARY STENT INTERVENTION (PCI-S) N/A 02/17/2012   Procedure: PERCUTANEOUS CORONARY STENT INTERVENTION (PCI-S);  Surgeon: Sinclair Grooms, MD;  Location: [facility] CATH LAB;  Service: Cardiovascular;  Laterality: N/A;    Current Medications: Current Meds  Medication Sig   albuterol (VENTOLIN HFA) 108 (90 Base) MCG/ACT inhaler 2 puffs as needed   amLODipine (NORVASC) 5 MG tablet Take 5 mg by mouth daily.   aspirin 81 MG tablet Take 81 mg by mouth daily.   atorvastatin (LIPITOR) 80 MG tablet Take 80 mg by mouth daily.    benzonatate (TESSALON) 100 MG capsule Take 100 mg by mouth as needed.   Bimatoprost (LUMIGAN OP) Place 1 drop into both eyes at bedtime.   COMBIGAN 0.2-0.5 % ophthalmic solution Place 1 drop into both eyes 2 (two) times daily.   Continuous Blood Gluc Sensor (FREESTYLE LIBRE 14 DAY SENSOR) MISC 1 each by Does not apply route every 14 (fourteen) days.   diclofenac sodium (VOLTAREN) 1 % GEL APPLY 2 GRAMS TO AREA UP TO 4 TIMES DAILY   gabapentin (NEURONTIN) 300 MG capsule TAKE 3 CAPSULES BY MOUTH AT BEDTIME   insulin NPH Human (HUMULIN N,NOVOLIN N) 100 UNIT/ML injection Inject 10 units into the skin each morning and 65 units into  the skin each evening.   insulin regular (NOVOLIN R,HUMULIN R) 100 units/mL injection Use as directed per scale three (3) times daily.   latanoprost (XALATAN) 0.005 % ophthalmic solution INSTILL 1 DROP INTO EACH EYE AT NIGHT OR IN THE EVENING AS DIRECTED   losartan (COZAAR) 100 MG tablet Take 50 mg by mouth daily.   Multiple Vitamins-Minerals (MULTI FOR HER 50+) TABS See admin instructions.   Multiple Vitamins-Minerals (MULTIVITAMIN PO) Take 1 tablet by mouth daily.   nitroGLYCERIN (NITROSTAT) 0.4 MG SL tablet Place 1 tablet (0.4 mg total) under the tongue every 5 (five) minutes as needed for chest pain.   traMADol (ULTRAM) 50 MG tablet Take one (1) tablet (50 mg) by mouth every eight (8) to twelve  (12) hours as needed for pain.     Allergies:   Erythromycin, Penicillins, Tetracycline, Sulfonamide derivatives, Guaifenesin er, Morphine, Sulfa antibiotics, and Sulfur   Social History   Socioeconomic History   Marital status: Married    Spouse name: Not on file   Number of children: Not on file   Years of education: Not on file   Highest education level: Not on file  Occupational History   Not on file  Tobacco Use   Smoking status: Former    Years: 2.00    Types: Cigarettes    Quit date: 11/11/1969    Years since quitting: 51.1   Smokeless tobacco: Never  Vaping Use   Vaping Use: Never used  Substance and Sexual Activity   Alcohol use: No   Drug use: No   Sexual activity: Not Currently  Other Topics Concern   Not on file  Social History Narrative   Not on file   Social Determinants of Health   Financial Resource Strain: Not on file  Food Insecurity: Not on file  Transportation Needs: Not on file  Physical Activity: Not on file  Stress: Not on file  Social Connections: Not on file     Family History: The patient's family history includes Angina in her mother; Diabetes in her mother; Lung cancer in her father; Other in her sister.  ROS:   Please see the history of present illness.    She is ambulating freely.  Appetite is improved.  Denies cough.  All other systems reviewed and are negative.  EKGs/Labs/Other Studies Reviewed:    The following studies were reviewed today: No new imaging data  EKG:  EKG right bundle branch block, normal QRS axis.  Normal sinus rhythm.  Compared to prior tracing December 09, 2019, no changes noted.  Recent Labs: No results found for requested labs within last 8760 hours.  Recent Lipid Panel No results found for: CHOL, TRIG, HDL, CHOLHDL, VLDL, LDLCALC, LDLDIRECT  Physical Exam:    VS:  BP (!) 122/58   Pulse 78   Ht 5' 2.5" (1.588 m)   Wt 200 lb 12.8 oz (91.1 kg)   SpO2 97%   BMI 36.14 kg/m     Wt Readings from Last  3 Encounters:  01/08/21 200 lb 12.8 oz (91.1 kg)  01/03/20 194 lb 9.6 oz (88.3 kg)  12/18/18 195 lb 1.9 oz (88.5 kg)     GEN: Elderly and overweight. No acute distress HEENT: Normal NECK: No JVD. LYMPHATICS: No lymphadenopathy CARDIAC: No murmur. RRR no gallop, or edema. VASCULAR:  Normal Pulses. No bruits. RESPIRATORY:  Clear to auscultation without rales, wheezing or rhonchi  ABDOMEN: Soft, non-tender, non-distended, No pulsatile mass, MUSCULOSKELETAL: No deformity  SKIN: Warm and dry  NEUROLOGIC:  Alert and oriented x 3 PSYCHIATRIC:  Normal affect   ASSESSMENT:    1. Atherosclerosis of native coronary artery of native heart without angina pectoris   2. Hyperlipidemia, unspecified hyperlipidemia type   3. Essential hypertension   4. Diabetes mellitus due to underlying condition with other kidney complication, unspecified whether long term insulin use ([place])   5. Sleep apnea, unspecified type    PLAN:    In order of problems listed above:  Secondary prevention should be continued. Continue statin therapy Blood pressure control is excellent on amlodipine 5 mg/day and continue Cozaar 50 mg/day.  Diuretic therapy has been discontinued. We did not discuss her diabetes. Encouraged use of CPAP.   Overall education and awareness concerning primary/secondary risk prevention was discussed in detail: LDL less than 70, hemoglobin A1c less than 7, blood pressure target less than 130/80 mmHg, >150 minutes of moderate aerobic activity per week, avoidance of smoking, weight control (via diet and exercise), and continued surveillance/management of/for obstructive sleep apnea.   Overall, doing well.  No cardiac evaluation.  Recent stressful hospitalization without cardiac complications bodes well for the patient's cardiovascular status.  Plan clinical follow-up in 1 year.  Medication Adjustments/Labs and Tests Ordered: Current medicines are reviewed at length with the patient today.   Concerns regarding medicines are outlined above.  Orders Placed This Encounter  Procedures   EKG 12-Lead   No orders of the defined types were placed in this encounter.   Patient Instructions  Medication Instructions:  Your physician recommends that you continue on your current medications as directed. Please refer to the Current Medication list given to you today.  *If you need a refill on your cardiac medications before your next appointment, please call your pharmacy*   Lab Work: None If you have labs (blood work) drawn today and your tests are completely normal, you will receive your results only by: [place] (if you have MyChart) OR A paper copy in the mail If you have any lab test that is abnormal or we need to change your treatment, we will call you to review the results.   Testing/Procedures: None   Follow-Up: At [facility], you and your health needs are our priority.  As part of our continuing mission to provide you with exceptional heart care, we have created designated Provider Care Teams.  These Care Teams include your primary Cardiologist (physician) and Advanced Practice Providers (APPs -  Physician Assistants and Nurse Practitioners) who all work together to provide you with the care you need, when you need it.  We recommend signing up for the patient portal called "MyChart".  Sign up information is provided on this After Visit Summary.  MyChart is used to connect with patients for Virtual Visits (Telemedicine).  Patients are able to view lab/test results, encounter notes, upcoming appointments, etc.  Non-urgent messages can be sent to your provider as well.   To learn more about what you can do with MyChart, go to [email or web address].    Your next appointment:   1 year(s)  The format for your next appointment:   In Person  Provider:   Sinclair Grooms, MD     Other Instructions     Signed, Sinclair Grooms, MD  01/08/2021 5:06 PM     [place]

## 2021-01-08 ENCOUNTER — Encounter: Payer: Self-pay | Admitting: Interventional Cardiology

## 2021-01-08 ENCOUNTER — Other Ambulatory Visit: Payer: Self-pay

## 2021-01-08 ENCOUNTER — Ambulatory Visit (INDEPENDENT_AMBULATORY_CARE_PROVIDER_SITE_OTHER): Payer: Medicare Other | Admitting: Interventional Cardiology

## 2021-01-08 VITALS — BP 122/58 | HR 78 | Ht 62.5 in | Wt 200.8 lb

## 2021-01-08 DIAGNOSIS — I10 Essential (primary) hypertension: Secondary | ICD-10-CM

## 2021-01-08 DIAGNOSIS — G4730 Sleep apnea, unspecified: Secondary | ICD-10-CM

## 2021-01-08 DIAGNOSIS — E785 Hyperlipidemia, unspecified: Secondary | ICD-10-CM

## 2021-01-08 DIAGNOSIS — E0829 Diabetes mellitus due to underlying condition with other diabetic kidney complication: Secondary | ICD-10-CM

## 2021-01-08 DIAGNOSIS — I2510 Atherosclerotic heart disease of native coronary artery without angina pectoris: Secondary | ICD-10-CM

## 2021-01-08 NOTE — Patient Instructions (Signed)

## 2021-01-09 DIAGNOSIS — M1712 Unilateral primary osteoarthritis, left knee: Secondary | ICD-10-CM | POA: Diagnosis not present

## 2021-01-09 DIAGNOSIS — I10 Essential (primary) hypertension: Secondary | ICD-10-CM | POA: Diagnosis not present

## 2021-01-09 DIAGNOSIS — Z8701 Personal history of pneumonia (recurrent): Secondary | ICD-10-CM | POA: Diagnosis not present

## 2021-01-09 DIAGNOSIS — M1711 Unilateral primary osteoarthritis, right knee: Secondary | ICD-10-CM | POA: Diagnosis not present

## 2021-01-09 DIAGNOSIS — N179 Acute kidney failure, unspecified: Secondary | ICD-10-CM | POA: Diagnosis not present

## 2021-01-09 DIAGNOSIS — E1142 Type 2 diabetes mellitus with diabetic polyneuropathy: Secondary | ICD-10-CM | POA: Diagnosis not present

## 2021-01-09 DIAGNOSIS — E782 Mixed hyperlipidemia: Secondary | ICD-10-CM | POA: Diagnosis not present

## 2021-01-09 DIAGNOSIS — E1165 Type 2 diabetes mellitus with hyperglycemia: Secondary | ICD-10-CM | POA: Diagnosis not present

## 2021-01-09 DIAGNOSIS — I2510 Atherosclerotic heart disease of native coronary artery without angina pectoris: Secondary | ICD-10-CM | POA: Diagnosis not present

## 2021-01-09 DIAGNOSIS — E119 Type 2 diabetes mellitus without complications: Secondary | ICD-10-CM | POA: Diagnosis not present

## 2021-01-10 DIAGNOSIS — J188 Other pneumonia, unspecified organism: Secondary | ICD-10-CM | POA: Diagnosis not present

## 2021-01-10 DIAGNOSIS — E119 Type 2 diabetes mellitus without complications: Secondary | ICD-10-CM | POA: Diagnosis not present

## 2021-01-10 DIAGNOSIS — G4733 Obstructive sleep apnea (adult) (pediatric): Secondary | ICD-10-CM | POA: Diagnosis not present

## 2021-01-10 DIAGNOSIS — H409 Unspecified glaucoma: Secondary | ICD-10-CM | POA: Diagnosis not present

## 2021-01-10 DIAGNOSIS — G894 Chronic pain syndrome: Secondary | ICD-10-CM | POA: Diagnosis not present

## 2021-01-10 DIAGNOSIS — I10 Essential (primary) hypertension: Secondary | ICD-10-CM | POA: Diagnosis not present

## 2021-01-12 DIAGNOSIS — J188 Other pneumonia, unspecified organism: Secondary | ICD-10-CM | POA: Diagnosis not present

## 2021-01-12 DIAGNOSIS — H409 Unspecified glaucoma: Secondary | ICD-10-CM | POA: Diagnosis not present

## 2021-01-12 DIAGNOSIS — G894 Chronic pain syndrome: Secondary | ICD-10-CM | POA: Diagnosis not present

## 2021-01-12 DIAGNOSIS — I10 Essential (primary) hypertension: Secondary | ICD-10-CM | POA: Diagnosis not present

## 2021-01-12 DIAGNOSIS — G4733 Obstructive sleep apnea (adult) (pediatric): Secondary | ICD-10-CM | POA: Diagnosis not present

## 2021-01-12 DIAGNOSIS — E119 Type 2 diabetes mellitus without complications: Secondary | ICD-10-CM | POA: Diagnosis not present

## 2021-01-15 ENCOUNTER — Other Ambulatory Visit: Payer: Self-pay | Admitting: *Deleted

## 2021-01-15 DIAGNOSIS — G4733 Obstructive sleep apnea (adult) (pediatric): Secondary | ICD-10-CM | POA: Diagnosis not present

## 2021-01-15 DIAGNOSIS — J188 Other pneumonia, unspecified organism: Secondary | ICD-10-CM | POA: Diagnosis not present

## 2021-01-15 DIAGNOSIS — I10 Essential (primary) hypertension: Secondary | ICD-10-CM | POA: Diagnosis not present

## 2021-01-15 DIAGNOSIS — E119 Type 2 diabetes mellitus without complications: Secondary | ICD-10-CM | POA: Diagnosis not present

## 2021-01-15 DIAGNOSIS — H409 Unspecified glaucoma: Secondary | ICD-10-CM | POA: Diagnosis not present

## 2021-01-15 DIAGNOSIS — G894 Chronic pain syndrome: Secondary | ICD-10-CM | POA: Diagnosis not present

## 2021-01-15 NOTE — Patient Outreach (Signed)
[place] Coordinator follow up. Per [facility] Mrs. Clear returned home from [facility] on 12/30/20. She has [facility].   Will alert [facility] RNCM to make aware.   [name], MSN, RN,BSN [place] Acute Care Coordinator [phone] [facility]) [phone]  (Toll free office)

## 2021-01-16 ENCOUNTER — Other Ambulatory Visit: Payer: Self-pay | Admitting: *Deleted

## 2021-01-16 DIAGNOSIS — E1165 Type 2 diabetes mellitus with hyperglycemia: Secondary | ICD-10-CM | POA: Diagnosis not present

## 2021-01-16 DIAGNOSIS — E7800 Pure hypercholesterolemia, unspecified: Secondary | ICD-10-CM | POA: Diagnosis not present

## 2021-01-16 DIAGNOSIS — E1140 Type 2 diabetes mellitus with diabetic neuropathy, unspecified: Secondary | ICD-10-CM | POA: Diagnosis not present

## 2021-01-16 DIAGNOSIS — I10 Essential (primary) hypertension: Secondary | ICD-10-CM | POA: Diagnosis not present

## 2021-01-16 DIAGNOSIS — I2510 Atherosclerotic heart disease of native coronary artery without angina pectoris: Secondary | ICD-10-CM | POA: Diagnosis not present

## 2021-01-16 NOTE — Patient Outreach (Signed)
[facility]) Care Management  01/16/2021  Brittany Rojas [birth year] [id number]   Referral Date: 11/1 Referral Source: Data Analysis Referral Reason: Recent hospital discharge Insurance: [place] attempt #1 after post acute care coordinator notified this care manager of SNF/rehab discharge, successful (discharged on 11/19).  Identity verified.  This care manager introduced self and stated purpose of call.  [facility] care management services explained.    Social: Lives with husband but has friend in the neighborhood that is able to help when husband is not available or at work.  She report she is independent in ADL/IADL's.  Has [facility], state she will be discharged from their services in the next couple weeks.  Feels she is recovering well since being hospitalized with pneumonia.  Conditions: Per chart, has history of HTN, DM with A1C of 7.5, neuropathy, and HLD.    Medications: Report taking as instructed, denies question about administration nor does she have any concerns for financial assistance.   Appointments: Had follow up with cardiology on 11/28 and with PCP on 11/29.  Will see endocrinology today, will see ortho for her back and eye doctor within the next couple weeks.  Consent: Does not feel she has any needs at this time, does not consent to involvement but will accept brochure to review and make final decision.  Plan: RN CM will send successful outreach letter and follow up within the next 2 weeks to follow up on decision for engagement.  Valente David, [place], MSN [place] [phone]

## 2021-01-17 DIAGNOSIS — G4733 Obstructive sleep apnea (adult) (pediatric): Secondary | ICD-10-CM | POA: Diagnosis not present

## 2021-01-17 DIAGNOSIS — G894 Chronic pain syndrome: Secondary | ICD-10-CM | POA: Diagnosis not present

## 2021-01-17 DIAGNOSIS — J188 Other pneumonia, unspecified organism: Secondary | ICD-10-CM | POA: Diagnosis not present

## 2021-01-17 DIAGNOSIS — E119 Type 2 diabetes mellitus without complications: Secondary | ICD-10-CM | POA: Diagnosis not present

## 2021-01-17 DIAGNOSIS — I10 Essential (primary) hypertension: Secondary | ICD-10-CM | POA: Diagnosis not present

## 2021-01-17 DIAGNOSIS — H409 Unspecified glaucoma: Secondary | ICD-10-CM | POA: Diagnosis not present

## 2021-01-25 DIAGNOSIS — G4733 Obstructive sleep apnea (adult) (pediatric): Secondary | ICD-10-CM | POA: Diagnosis not present

## 2021-01-25 DIAGNOSIS — H409 Unspecified glaucoma: Secondary | ICD-10-CM | POA: Diagnosis not present

## 2021-01-25 DIAGNOSIS — J188 Other pneumonia, unspecified organism: Secondary | ICD-10-CM | POA: Diagnosis not present

## 2021-01-25 DIAGNOSIS — E119 Type 2 diabetes mellitus without complications: Secondary | ICD-10-CM | POA: Diagnosis not present

## 2021-01-25 DIAGNOSIS — I10 Essential (primary) hypertension: Secondary | ICD-10-CM | POA: Diagnosis not present

## 2021-01-25 DIAGNOSIS — G894 Chronic pain syndrome: Secondary | ICD-10-CM | POA: Diagnosis not present

## 2021-01-29 ENCOUNTER — Other Ambulatory Visit: Payer: Self-pay | Admitting: *Deleted

## 2021-01-29 DIAGNOSIS — E119 Type 2 diabetes mellitus without complications: Secondary | ICD-10-CM | POA: Diagnosis not present

## 2021-01-29 DIAGNOSIS — J188 Other pneumonia, unspecified organism: Secondary | ICD-10-CM | POA: Diagnosis not present

## 2021-01-29 DIAGNOSIS — I10 Essential (primary) hypertension: Secondary | ICD-10-CM | POA: Diagnosis not present

## 2021-01-29 DIAGNOSIS — G4733 Obstructive sleep apnea (adult) (pediatric): Secondary | ICD-10-CM | POA: Diagnosis not present

## 2021-01-29 DIAGNOSIS — H409 Unspecified glaucoma: Secondary | ICD-10-CM | POA: Diagnosis not present

## 2021-01-29 DIAGNOSIS — G894 Chronic pain syndrome: Secondary | ICD-10-CM | POA: Diagnosis not present

## 2021-01-29 NOTE — Patient Outreach (Signed)
[facility]) Care Management  01/29/2021  Brittany Rojas Apr 16, 1945 [id number]   Outgoing call placed to member to follow up on decision for [facility] involvement, no answer, HIPAA compliant voice message left.  Will follow up within the next 3-4 business days.  Valente David, RN, MSN, [place] Manager [phone]

## 2021-01-30 DIAGNOSIS — F1120 Opioid dependence, uncomplicated: Secondary | ICD-10-CM | POA: Insufficient documentation

## 2021-02-01 ENCOUNTER — Other Ambulatory Visit: Payer: Self-pay | Admitting: *Deleted

## 2021-02-01 NOTE — Patient Outreach (Signed)
[place] [facility]) Care Management  02/01/2021  ALEATHEA PUGMIRE 12-13-1945 [id number]   Outreach attempt #2, successful.  Offered [facility] care management services again, benefits explained, member declines.  State she is doing well, has completed rehab/PT as well as follow up appointments.  Encouraged to contact this care manager or the 24 hour triage line if needs change.  Will close case at this time.  Valente David, [place], MSN [place] [phone]

## 2021-02-02 DIAGNOSIS — H401131 Primary open-angle glaucoma, bilateral, mild stage: Secondary | ICD-10-CM | POA: Diagnosis not present

## 2021-03-06 ENCOUNTER — Ambulatory Visit (INDEPENDENT_AMBULATORY_CARE_PROVIDER_SITE_OTHER): Payer: Medicare Other | Admitting: Sports Medicine

## 2021-03-06 ENCOUNTER — Encounter: Payer: Self-pay | Admitting: Sports Medicine

## 2021-03-06 DIAGNOSIS — M79674 Pain in right toe(s): Secondary | ICD-10-CM | POA: Diagnosis not present

## 2021-03-06 DIAGNOSIS — M79671 Pain in right foot: Secondary | ICD-10-CM

## 2021-03-06 DIAGNOSIS — M79672 Pain in left foot: Secondary | ICD-10-CM

## 2021-03-06 DIAGNOSIS — E1142 Type 2 diabetes mellitus with diabetic polyneuropathy: Secondary | ICD-10-CM

## 2021-03-06 DIAGNOSIS — M79675 Pain in left toe(s): Secondary | ICD-10-CM | POA: Diagnosis not present

## 2021-03-06 DIAGNOSIS — L84 Corns and callosities: Secondary | ICD-10-CM

## 2021-03-06 DIAGNOSIS — B351 Tinea unguium: Secondary | ICD-10-CM | POA: Diagnosis not present

## 2021-03-06 NOTE — Progress Notes (Signed)
Subjective: Brittany Rojas is a 76 y.o. female patient with history of diabetes who presents to office today complaining of painful callus and nails; unable to trim.  Reports that his callus is sore on the right foot and states that she is doing better has not had any more rehospitalization over the last 2 months. Patient denies any other changes with health history since last encounter.   Fasting blood sugar 137, PCP visit 1 month ago  Patient Active Problem List   Diagnosis Date Noted   Acquired hammer toe of left foot 08/04/2020   Acquired trigger finger 08/04/2020   Arthritis of left knee 08/04/2020   Artificial knee joint present 08/04/2020   Bronchospasm 08/04/2020   Bunion of right foot 08/04/2020   Foot callus 08/04/2020   Gastroenteritis 08/04/2020   Impacted cerumen 08/04/2020   Neuropathy 08/04/2020   Other seasonal allergic rhinitis 08/04/2020   Polyneuropathy due to type 2 diabetes mellitus ([place]) 08/04/2020   Type 2 diabetes mellitus without complications ([place]) 29/56/2130   Vertigo 08/04/2020   Vitamin D deficiency 08/04/2020   Displacement of lumbar intervertebral disc without myelopathy 06/14/2020   Knee pain, left 06/14/2020   Diabetic neuropathy ([place]) 03/30/2020   Hyperglycemia due to type 2 diabetes mellitus ([place]) 03/30/2020   Pure hypercholesterolemia 03/30/2020   Shoulder pain 08/01/2014   Chronic low back pain 12/21/2013   Lumbosacral spondylosis with radiculopathy 12/21/2013   Lumbar radiculopathy 10/11/2013   Coronary atherosclerosis of native coronary artery 12/08/2012   DM (diabetes mellitus) ([place]) 02/28/2012   Sleep apnea 10/29/2011   NEUROPATHY 06/14/2008   Hyperlipidemia 04/29/2007   EXOGENOUS OBESITY 04/29/2007   DEPRESSION 04/29/2007   Essential hypertension 04/29/2007   PERIPHERAL EDEMA 04/29/2007   History of cardiovascular disorder 04/29/2007   DIVERTICULITIS, HX OF 04/29/2007   Current Outpatient Medications on File Prior to Visit   Medication Sig Dispense Refill   albuterol (VENTOLIN HFA) 108 (90 Base) MCG/ACT inhaler 2 puffs as needed     amLODipine (NORVASC) 5 MG tablet Take 5 mg by mouth daily.     aspirin 81 MG tablet Take 81 mg by mouth daily.     atorvastatin (LIPITOR) 80 MG tablet Take 80 mg by mouth daily.      benzonatate (TESSALON) 100 MG capsule Take 100 mg by mouth as needed.     Bimatoprost (LUMIGAN OP) Place 1 drop into both eyes at bedtime.     clindamycin (CLEOCIN) 150 MG capsule SMARTSIG:4 Capsule(s) By Mouth Once (Patient not taking: Reported on 01/08/2021)     COMBIGAN 0.2-0.5 % ophthalmic solution Place 1 drop into both eyes 2 (two) times daily.  2   Continuous Blood Gluc Sensor (FREESTYLE LIBRE 14 DAY SENSOR) MISC 1 each by Does not apply route every 14 (fourteen) days. 2 each 5   diclofenac sodium (VOLTAREN) 1 % GEL APPLY 2 GRAMS TO AREA UP TO 4 TIMES DAILY     gabapentin (NEURONTIN) 300 MG capsule TAKE 3 CAPSULES BY MOUTH AT BEDTIME 270 capsule 3   hydrochlorothiazide (HYDRODIURIL) 25 MG tablet Take 25 mg by mouth daily. (Patient not taking: Reported on 01/08/2021)     insulin NPH Human (HUMULIN N,NOVOLIN N) 100 UNIT/ML injection Inject 10 units into the skin each morning and 65 units into the skin each evening.     insulin regular (NOVOLIN R,HUMULIN R) 100 units/mL injection Use as directed per scale three (3) times daily.     latanoprost (XALATAN) 0.005 % ophthalmic solution INSTILL 1 DROP  INTO EACH EYE AT NIGHT OR IN THE EVENING AS DIRECTED     losartan (COZAAR) 100 MG tablet Take 50 mg by mouth daily.     Multiple Vitamins-Minerals (MULTI FOR HER 50+) TABS See admin instructions.     Multiple Vitamins-Minerals (MULTIVITAMIN PO) Take 1 tablet by mouth daily.     nitroGLYCERIN (NITROSTAT) 0.4 MG SL tablet Place 1 tablet (0.4 mg total) under the tongue every 5 (five) minutes as needed for chest pain. 25 tablet 3   oxyCODONE (OXY IR/ROXICODONE) 5 MG immediate release tablet TAKE 1 TABLET BY MOUTH  EVERY 4 TO 6 HOURS AS NEEDED FOR MODERATE TO SEVERE PAIN (Patient not taking: Reported on 01/08/2021)     traMADol (ULTRAM) 50 MG tablet Take one (1) tablet (50 mg) by mouth every eight (8) to twelve (12) hours as needed for pain.  5   [DISCONTINUED] telmisartan-hydrochlorothiazide (MICARDIS HCT) 80-12.5 MG per tablet Take 1 tablet by mouth daily.       No current facility-administered medications on file prior to visit.   Allergies  Allergen Reactions   Erythromycin Other (See Comments)    "upset stomach" (02/17/2012)   Penicillins Other (See Comments)    ">50 yr ago, I had strept throat; had gotten lots and lots of PCN; last dose caused me to stop breathing" (02/17/2012)   Tetracycline Other (See Comments)    "upset stomach" (02/17/2012)   Sulfonamide Derivatives Rash and Other (See Comments)    "upset stomach" (02/17/2012)   Guaifenesin Er Other (See Comments)   Morphine Nausea And Vomiting   Sulfa Antibiotics Other (See Comments)   Sulfur     No results found for this or any previous visit (from the past 2160 hour(s)).  Objective: General: Patient is awake, alert, and oriented x 3 and in no acute distress.  Integument: Skin is warm, dry and supple bilateral. Nails long and, thickened and dystrophic with subungual debris, consistent with onychomycosis, 1-5 bilateral. No signs of infection.  Callus right sub-met 2 and left medial bunion.  Remaining integument unremarkable.  Vasculature:  Dorsalis Pedis pulse 1/4 bilateral. Posterior Tibial pulse  1/4 bilateral. Capillary fill time <3 sec 1-5 bilateral.  Scant hair growth to the level of the digits.Temperature gradient within normal limits.  Moderate varicosities present bilateral. No edema present bilateral.   Neurology: Unchanged from prior.   Musculoskeletal: Bunion and hammertoe with pes planus pedal deformities noted bilateral. Muscular strength 5/5 in all lower extremity muscular groups bilateral without pain on range of motion . No  tenderness with calf compression bilateral.  Assessment and Plan: Problem List Items Addressed This Visit       Endocrine   Diabetic neuropathy ([place])   Other Visit Diagnoses     Pain due to onychomycosis of toenails of both feet    -  Primary   Callus of foot       Foot pain, bilateral          -Examined patient. -Re-discussed the importance of daily foot inspection setting diabetes -Mechanically debrided all painful nails using a sterile nail nipper and debrided callus x2 at right sub-met 2 and left medial bunion using sterile chisel blade and filed with dremel without incident -Continue with diabetic shoes, added additional offloading dancers pad to the right insole of her shoe to offload the callused area -Patient to return in 29 days for nail and callus care or sooner problems or issues arise -Patient advised to call the office if any problems or  questions arise in the meantime.  Landis Martins, DPM

## 2021-05-04 DIAGNOSIS — H0100A Unspecified blepharitis right eye, upper and lower eyelids: Secondary | ICD-10-CM | POA: Diagnosis not present

## 2021-05-04 DIAGNOSIS — H0100B Unspecified blepharitis left eye, upper and lower eyelids: Secondary | ICD-10-CM | POA: Diagnosis not present

## 2021-05-08 ENCOUNTER — Encounter: Payer: Self-pay | Admitting: Sports Medicine

## 2021-05-08 ENCOUNTER — Ambulatory Visit (INDEPENDENT_AMBULATORY_CARE_PROVIDER_SITE_OTHER): Payer: Medicare Other | Admitting: Sports Medicine

## 2021-05-08 ENCOUNTER — Other Ambulatory Visit: Payer: Self-pay

## 2021-05-08 DIAGNOSIS — E1142 Type 2 diabetes mellitus with diabetic polyneuropathy: Secondary | ICD-10-CM

## 2021-05-08 DIAGNOSIS — M2042 Other hammer toe(s) (acquired), left foot: Secondary | ICD-10-CM

## 2021-05-08 DIAGNOSIS — M79671 Pain in right foot: Secondary | ICD-10-CM

## 2021-05-08 DIAGNOSIS — B351 Tinea unguium: Secondary | ICD-10-CM | POA: Diagnosis not present

## 2021-05-08 DIAGNOSIS — M79675 Pain in left toe(s): Secondary | ICD-10-CM | POA: Diagnosis not present

## 2021-05-08 DIAGNOSIS — E2839 Other primary ovarian failure: Secondary | ICD-10-CM | POA: Insufficient documentation

## 2021-05-08 DIAGNOSIS — M2142 Flat foot [pes planus] (acquired), left foot: Secondary | ICD-10-CM

## 2021-05-08 DIAGNOSIS — M2141 Flat foot [pes planus] (acquired), right foot: Secondary | ICD-10-CM

## 2021-05-08 DIAGNOSIS — L84 Corns and callosities: Secondary | ICD-10-CM

## 2021-05-08 DIAGNOSIS — Z8701 Personal history of pneumonia (recurrent): Secondary | ICD-10-CM | POA: Insufficient documentation

## 2021-05-08 DIAGNOSIS — M2041 Other hammer toe(s) (acquired), right foot: Secondary | ICD-10-CM

## 2021-05-08 DIAGNOSIS — M79674 Pain in right toe(s): Secondary | ICD-10-CM

## 2021-05-08 DIAGNOSIS — M79672 Pain in left foot: Secondary | ICD-10-CM

## 2021-05-08 NOTE — Progress Notes (Signed)
?Subjective: ?Brittany Rojas is a 76 y.o. female patient with history of diabetes who presents to office today complaining of painful callus and nails; unable to trim.  Reports that her calluses are sore. ?  ?Fasting blood sugar not recorded, PCP [name], MD last visit in Dec 2022 ? ?Patient Active Problem List  ? Diagnosis Date Noted  ? Decreased estrogen level 05/08/2021  ? H/O: pneumonia 05/08/2021  ? Uncomplicated opioid dependence ([place]) 01/30/2021  ? Acquired hammer toe of left foot 08/04/2020  ? Acquired trigger finger 08/04/2020  ? Arthritis of left knee 08/04/2020  ? Artificial knee joint present 08/04/2020  ? Bronchospasm 08/04/2020  ? Bunion of right foot 08/04/2020  ? Foot callus 08/04/2020  ? Gastroenteritis 08/04/2020  ? Impacted cerumen 08/04/2020  ? Neuropathy 08/04/2020  ? Other seasonal allergic rhinitis 08/04/2020  ? Polyneuropathy due to type 2 diabetes mellitus ([place]) 08/04/2020  ? Type 2 diabetes mellitus without complications ([place]) 53/29/9242  ? Vertigo 08/04/2020  ? Vitamin D deficiency 08/04/2020  ? Displacement of lumbar intervertebral disc without myelopathy 06/14/2020  ? Knee pain, left 06/14/2020  ? Diabetic neuropathy ([place]) 03/30/2020  ? Hyperglycemia due to type 2 diabetes mellitus ([place]) 03/30/2020  ? Pure hypercholesterolemia 03/30/2020  ? Shoulder pain 08/01/2014  ? Chronic low back pain 12/21/2013  ? Lumbosacral spondylosis with radiculopathy 12/21/2013  ? Lumbar radiculopathy 10/11/2013  ? Coronary atherosclerosis of native coronary artery 12/08/2012  ? DM (diabetes mellitus) ([place]) 02/28/2012  ? Sleep apnea 10/29/2011  ? NEUROPATHY 06/14/2008  ? Hyperlipidemia 04/29/2007  ? EXOGENOUS OBESITY 04/29/2007  ? DEPRESSION 04/29/2007  ? Essential hypertension 04/29/2007  ? PERIPHERAL EDEMA 04/29/2007  ? History of cardiovascular disorder 04/29/2007  ? DIVERTICULITIS, HX OF 04/29/2007  ? ?Current Outpatient Medications on File Prior to Visit  ?Medication Sig Dispense Refill  ?  albuterol (VENTOLIN HFA) 108 (90 Base) MCG/ACT inhaler 2 puffs as needed    ? amLODipine (NORVASC) 5 MG tablet Take 5 mg by mouth daily.    ? aspirin 81 MG tablet Take 81 mg by mouth daily.    ? atorvastatin (LIPITOR) 80 MG tablet Take 80 mg by mouth daily.     ? benzonatate (TESSALON) 100 MG capsule Take 100 mg by mouth as needed.    ? Bimatoprost (LUMIGAN OP) Place 1 drop into both eyes at bedtime.    ? clindamycin (CLEOCIN) 150 MG capsule SMARTSIG:4 Capsule(s) By Mouth Once (Patient not taking: Reported on 01/08/2021)    ? COMBIGAN 0.2-0.5 % ophthalmic solution Place 1 drop into both eyes 2 (two) times daily.  2  ? Continuous Blood Gluc Receiver (FREESTYLE LIBRE 2 READER) DEVI See admin instructions.    ? Continuous Blood Gluc Sensor (FREESTYLE LIBRE 14 DAY SENSOR) MISC 1 each by Does not apply route every 14 (fourteen) days. 2 each 5  ? diclofenac sodium (VOLTAREN) 1 % GEL APPLY 2 GRAMS TO AREA UP TO 4 TIMES DAILY    ? dorzolamide (TRUSOPT) 2 % ophthalmic solution Place 1 drop into the right eye 2 (two) times daily.    ? gabapentin (NEURONTIN) 300 MG capsule TAKE 3 CAPSULES BY MOUTH AT BEDTIME 270 capsule 3  ? hydrochlorothiazide (HYDRODIURIL) 25 MG tablet Take 25 mg by mouth daily. (Patient not taking: Reported on 01/08/2021)    ? insulin NPH Human (HUMULIN N,NOVOLIN N) 100 UNIT/ML injection Inject 10 units into the skin each morning and 65 units into the skin each evening.    ? insulin regular (  NOVOLIN R,HUMULIN R) 100 units/mL injection Use as directed per scale three (3) times daily.    ? latanoprost (XALATAN) 0.005 % ophthalmic solution INSTILL 1 DROP INTO EACH EYE AT NIGHT OR IN THE EVENING AS DIRECTED    ? losartan (COZAAR) 100 MG tablet Take 50 mg by mouth daily.    ? losartan-hydrochlorothiazide (HYZAAR) 100-25 MG tablet Take 1 tablet by mouth daily.    ? LUMIGAN 0.01 % SOLN 1 drop at bedtime.    ? Multiple Vitamins-Minerals (MULTI FOR HER 50+) TABS See admin instructions.    ? Multiple  Vitamins-Minerals (MULTIVITAMIN PO) Take 1 tablet by mouth daily.    ? nitroGLYCERIN (NITROSTAT) 0.4 MG SL tablet Place 1 tablet (0.4 mg total) under the tongue every 5 (five) minutes as needed for chest pain. 25 tablet 3  ? oxyCODONE (OXY IR/ROXICODONE) 5 MG immediate release tablet TAKE 1 TABLET BY MOUTH EVERY 4 TO 6 HOURS AS NEEDED FOR MODERATE TO SEVERE PAIN (Patient not taking: Reported on 01/08/2021)    ? traMADol (ULTRAM) 50 MG tablet Take one (1) tablet (50 mg) by mouth every eight (8) to twelve (12) hours as needed for pain.  5  ? ZYLET 0.5-0.3 % SUSP Apply 1 drop to eye 3 (three) times daily.    ? [DISCONTINUED] telmisartan-hydrochlorothiazide (MICARDIS HCT) 80-12.5 MG per tablet Take 1 tablet by mouth daily.      ? ?No current facility-administered medications on file prior to visit.  ? ?Allergies  ?Allergen Reactions  ? Erythromycin Other (See Comments)  ?  "upset stomach" (02/17/2012)  ? Penicillins Other (See Comments)  ?  ">50 yr ago, I had strept throat; had gotten lots and lots of PCN; last dose caused me to stop breathing" (02/17/2012)  ? Tetracycline Other (See Comments)  ?  "upset stomach" (02/17/2012)  ? Sulfonamide Derivatives Rash and Other (See Comments)  ?  "upset stomach" (02/17/2012)  ? Guaifenesin Er Other (See Comments)  ? Morphine Nausea And Vomiting  ? Sulfa Antibiotics Other (See Comments)  ? Sulfur   ? ? ?No results found for this or any previous visit (from the past 2160 hour(s)). ? ?Objective: ?General: Patient is awake, alert, and oriented x 3 and in no acute distress. ? ?Integument: Skin is warm, dry and supple bilateral. Nails long and, thickened and dystrophic with subungual debris, consistent with onychomycosis, 1-5 bilateral. No signs of infection.  Callus right sub-met 2 , 4th webspace and left medial bunion.  Remaining integument unremarkable. ? ?Vasculature:  Dorsalis Pedis pulse 1/4 bilateral. Posterior Tibial pulse  1/4 bilateral. Capillary fill time <3 sec 1-5 bilateral.  Scant  hair growth to the level of the digits.Temperature gradient within normal limits.  Moderate varicosities present bilateral. No edema present bilateral.  ? ?Neurology: Unchanged from prior.  ? ?Musculoskeletal: Bunion and hammertoe with pes planus pedal deformities noted bilateral. Muscular strength 5/5 in all lower extremity muscular groups bilateral without pain on range of motion . No tenderness with calf compression bilateral. ? ?Assessment and Plan: ?Problem List Items Addressed This Visit   ? ?  ? Endocrine  ? Diabetic neuropathy ([place])  ? Relevant Medications  ? losartan-hydrochlorothiazide (HYZAAR) 100-25 MG tablet  ? ?Other Visit Diagnoses   ? ? Pain due to onychomycosis of toenails of both feet    -  Primary  ? Callus of foot      ? Foot pain, bilateral      ? Acquired hammertoes of both feet      ?  Pes planus of both feet      ? ?  ? ?-Examined patient. ?-Re-discussed the importance of daily foot inspection setting diabetes ?-Mechanically debrided all painful nails using a sterile nail nipper and debrided callus x3 at right 4th webspace, right sub-met 2 and left medial bunion using sterile chisel blade and filed with dremel without incident ?-Continue with diabetic shoes, added additional offloading dancers pad to the right insole of her shoe to offload the callused area ?-Patient to return in 9-10 weeks for nail and callus care or sooner problems or issues arise ?-Patient advised to call the office if any problems or questions arise in the meantime. ? ?Landis Martins, DPM ? ?

## 2021-05-10 DIAGNOSIS — Z23 Encounter for immunization: Secondary | ICD-10-CM | POA: Diagnosis not present

## 2021-05-10 DIAGNOSIS — H6123 Impacted cerumen, bilateral: Secondary | ICD-10-CM | POA: Diagnosis not present

## 2021-05-10 DIAGNOSIS — E1165 Type 2 diabetes mellitus with hyperglycemia: Secondary | ICD-10-CM | POA: Diagnosis not present

## 2021-05-10 DIAGNOSIS — M1712 Unilateral primary osteoarthritis, left knee: Secondary | ICD-10-CM | POA: Diagnosis not present

## 2021-05-10 DIAGNOSIS — I2510 Atherosclerotic heart disease of native coronary artery without angina pectoris: Secondary | ICD-10-CM | POA: Diagnosis not present

## 2021-05-10 DIAGNOSIS — E1142 Type 2 diabetes mellitus with diabetic polyneuropathy: Secondary | ICD-10-CM | POA: Diagnosis not present

## 2021-05-10 DIAGNOSIS — I10 Essential (primary) hypertension: Secondary | ICD-10-CM | POA: Diagnosis not present

## 2021-05-10 DIAGNOSIS — M1711 Unilateral primary osteoarthritis, right knee: Secondary | ICD-10-CM | POA: Diagnosis not present

## 2021-05-10 DIAGNOSIS — E782 Mixed hyperlipidemia: Secondary | ICD-10-CM | POA: Diagnosis not present

## 2021-05-10 DIAGNOSIS — Z96652 Presence of left artificial knee joint: Secondary | ICD-10-CM | POA: Diagnosis not present

## 2021-05-10 DIAGNOSIS — G4733 Obstructive sleep apnea (adult) (pediatric): Secondary | ICD-10-CM | POA: Diagnosis not present

## 2021-05-14 DIAGNOSIS — M5416 Radiculopathy, lumbar region: Secondary | ICD-10-CM | POA: Diagnosis not present

## 2021-05-17 DIAGNOSIS — E1142 Type 2 diabetes mellitus with diabetic polyneuropathy: Secondary | ICD-10-CM | POA: Diagnosis not present

## 2021-05-17 DIAGNOSIS — I2510 Atherosclerotic heart disease of native coronary artery without angina pectoris: Secondary | ICD-10-CM | POA: Diagnosis not present

## 2021-05-17 DIAGNOSIS — E782 Mixed hyperlipidemia: Secondary | ICD-10-CM | POA: Diagnosis not present

## 2021-05-17 DIAGNOSIS — I10 Essential (primary) hypertension: Secondary | ICD-10-CM | POA: Diagnosis not present

## 2021-07-12 ENCOUNTER — Ambulatory Visit (INDEPENDENT_AMBULATORY_CARE_PROVIDER_SITE_OTHER): Payer: Medicare Other | Admitting: Podiatry

## 2021-07-12 ENCOUNTER — Encounter: Payer: Self-pay | Admitting: Podiatry

## 2021-07-12 DIAGNOSIS — M2041 Other hammer toe(s) (acquired), right foot: Secondary | ICD-10-CM | POA: Diagnosis not present

## 2021-07-12 DIAGNOSIS — M21619 Bunion of unspecified foot: Secondary | ICD-10-CM | POA: Diagnosis not present

## 2021-07-12 DIAGNOSIS — M2010 Hallux valgus (acquired), unspecified foot: Secondary | ICD-10-CM

## 2021-07-12 DIAGNOSIS — E1142 Type 2 diabetes mellitus with diabetic polyneuropathy: Secondary | ICD-10-CM | POA: Diagnosis not present

## 2021-07-12 DIAGNOSIS — M79675 Pain in left toe(s): Secondary | ICD-10-CM | POA: Diagnosis not present

## 2021-07-12 DIAGNOSIS — B351 Tinea unguium: Secondary | ICD-10-CM

## 2021-07-12 DIAGNOSIS — L84 Corns and callosities: Secondary | ICD-10-CM | POA: Diagnosis not present

## 2021-07-12 DIAGNOSIS — Z96652 Presence of left artificial knee joint: Secondary | ICD-10-CM | POA: Insufficient documentation

## 2021-07-12 DIAGNOSIS — M79674 Pain in right toe(s): Secondary | ICD-10-CM | POA: Diagnosis not present

## 2021-07-12 DIAGNOSIS — M2042 Other hammer toe(s) (acquired), left foot: Secondary | ICD-10-CM | POA: Diagnosis not present

## 2021-07-12 DIAGNOSIS — E119 Type 2 diabetes mellitus without complications: Secondary | ICD-10-CM

## 2021-07-17 DIAGNOSIS — E7800 Pure hypercholesterolemia, unspecified: Secondary | ICD-10-CM | POA: Diagnosis not present

## 2021-07-17 DIAGNOSIS — E1165 Type 2 diabetes mellitus with hyperglycemia: Secondary | ICD-10-CM | POA: Diagnosis not present

## 2021-07-17 DIAGNOSIS — I10 Essential (primary) hypertension: Secondary | ICD-10-CM | POA: Diagnosis not present

## 2021-07-17 DIAGNOSIS — E1140 Type 2 diabetes mellitus with diabetic neuropathy, unspecified: Secondary | ICD-10-CM | POA: Diagnosis not present

## 2021-07-17 DIAGNOSIS — I2510 Atherosclerotic heart disease of native coronary artery without angina pectoris: Secondary | ICD-10-CM | POA: Diagnosis not present

## 2021-07-17 NOTE — Progress Notes (Signed)
ANNUAL DIABETIC FOOT EXAM  Subjective: Brittany Rojas presents today for annual diabetic foot examination.  Patient relates 25 year h/o diabetes.  Patient denies any h/o foot wounds.  Patient has h/o skin cancer of right ankle.  Patient has been diagnosed with neuropathy and it is managed with gabapentin.  Patient's blood sugar was 110 mg/dl today. Last known  HgA1c was unknown   Risk factors: diabetes, diabetic neuropathy, hyperlipidemia, h/o tobacco use in remission.  Brittany Shanks, MD is patient's PCP. Last visit was Jun 25, 2021.  Past Medical History:  Diagnosis Date   Bronchitis    "touch q now and then" (02/17/2012)   Depression    Diverticulitis    History of blood transfusion 1945   "when I was 54 days old" (02/17/2012)   History of transient ischemic attack (TIA) 2001   "possibly; never really pinned down what it was" (02/17/2012)   Hyperlipidemia    Hypertension    Migraines    "outgrew them I guess" (02/17/2012)   OSA on CPAP    Pneumonia 12/2010   Shortness of breath    "occasionally; at any time" (02/17/2012)   Type II diabetes mellitus ([place])    Patient Active Problem List   Diagnosis Date Noted   History of arthroplasty of left knee 07/12/2021   Decreased estrogen level 05/08/2021   H/O: pneumonia 12/05/8525   Uncomplicated opioid dependence ([place]) 01/30/2021   Acquired hammer toe of left foot 08/04/2020   Acquired trigger finger 08/04/2020   Arthritis of left knee 08/04/2020   Artificial knee joint present 08/04/2020   Bronchospasm 08/04/2020   Bunion of right foot 08/04/2020   Foot callus 08/04/2020   Gastroenteritis 08/04/2020   Impacted cerumen 08/04/2020   Neuropathy 08/04/2020   Other seasonal allergic rhinitis 08/04/2020   Polyneuropathy due to type 2 diabetes mellitus ([place]) 08/04/2020   Type 2 diabetes mellitus without complications ([place]) 78/24/2353   Vertigo 08/04/2020   Vitamin D deficiency 08/04/2020   Displacement of lumbar intervertebral  disc without myelopathy 06/14/2020   Knee pain, left 06/14/2020   Diabetic neuropathy ([place]) 03/30/2020   Hyperglycemia due to type 2 diabetes mellitus ([place]) 03/30/2020   Pure hypercholesterolemia 03/30/2020   Shoulder pain 08/01/2014   Chronic low back pain 12/21/2013   Lumbosacral spondylosis with radiculopathy 12/21/2013   Lumbar radiculopathy 10/11/2013   Coronary atherosclerosis of native coronary artery 12/08/2012   DM (diabetes mellitus) ([place]) 02/28/2012   Sleep apnea 10/29/2011   NEUROPATHY 06/14/2008   Hyperlipidemia 04/29/2007   EXOGENOUS OBESITY 04/29/2007   DEPRESSION 04/29/2007   Essential hypertension 04/29/2007   PERIPHERAL EDEMA 04/29/2007   History of cardiovascular disorder 04/29/2007   DIVERTICULITIS, HX OF 04/29/2007   Past Surgical History:  Procedure Laterality Date   ABDOMINAL HYSTERECTOMY  2000's   APPENDECTOMY  04/1988   CARDIAC CATHETERIZATION  02/13/2012   COLECTOMY  01/18/1988   "took 3 feet out; result of lots of burst diverticula" (02/17/2012)   COLECTOMY  04/1988   "9 more inches of colon" (02/17/2012)   COLOSTOMY  01/18/1988   COLOSTOMY REVERSAL  04/1988   CORONARY ANGIOPLASTY WITH STENT PLACEMENT  02/17/2012   "1; first one ever" (02/17/2012)   LEFT OOPHORECTOMY  1989   PERCUTANEOUS CORONARY STENT INTERVENTION (PCI-S) N/A 02/17/2012   Procedure: PERCUTANEOUS CORONARY STENT INTERVENTION (PCI-S);  Surgeon: Sinclair Grooms, MD;  Location: [facility] CATH LAB;  Service: Cardiovascular;  Laterality: N/A;   Current Outpatient Medications on File Prior to Visit  Medication Sig Dispense Refill   albuterol (VENTOLIN HFA) 108 (90 Base) MCG/ACT inhaler 2 puffs as needed     amLODipine (NORVASC) 5 MG tablet Take 5 mg by mouth daily.     aspirin 81 MG tablet Take 81 mg by mouth daily.     atorvastatin (LIPITOR) 80 MG tablet Take 80 mg by mouth daily.      Bimatoprost (LUMIGAN OP) Place 1 drop into both eyes at bedtime.     COMBIGAN 0.2-0.5 % ophthalmic solution Place 1 drop  into both eyes 2 (two) times daily.  2   Continuous Blood Gluc Receiver (FREESTYLE LIBRE 2 READER) DEVI See admin instructions.     Continuous Blood Gluc Sensor (FREESTYLE LIBRE 14 DAY SENSOR) MISC 1 each by Does not apply route every 14 (fourteen) days. 2 each 5   diclofenac sodium (VOLTAREN) 1 % GEL APPLY 2 GRAMS TO AREA UP TO 4 TIMES DAILY     dorzolamide (TRUSOPT) 2 % ophthalmic solution Place 1 drop into the right eye 2 (two) times daily.     gabapentin (NEURONTIN) 300 MG capsule TAKE 3 CAPSULES BY MOUTH AT BEDTIME 270 capsule 3   insulin NPH Human (HUMULIN N,NOVOLIN N) 100 UNIT/ML injection Inject 10 units into the skin each morning and 65 units into the skin each evening.     insulin regular (NOVOLIN R,HUMULIN R) 100 units/mL injection Use as directed per scale three (3) times daily.     latanoprost (XALATAN) 0.005 % ophthalmic solution 1 drop into both eyes in the evening     losartan (COZAAR) 100 MG tablet Take 50 mg by mouth daily.     losartan-hydrochlorothiazide (HYZAAR) 100-25 MG tablet Take 1 tablet by mouth daily.     LUMIGAN 0.01 % SOLN 1 drop at bedtime.     Multiple Vitamins-Minerals (MULTI FOR HER 50+) TABS See admin instructions.     nitroGLYCERIN (NITROSTAT) 0.4 MG SL tablet Place 1 tablet (0.4 mg total) under the tongue every 5 (five) minutes as needed for chest pain. 25 tablet 3   nitroGLYCERIN (NITROSTAT) 0.4 MG SL tablet See admin instructions.     traMADol (ULTRAM) 50 MG tablet Take one (1) tablet (50 mg) by mouth every eight (8) to twelve (12) hours as needed for pain.  5   ZYLET 0.5-0.3 % SUSP Apply 1 drop to eye 3 (three) times daily.     [DISCONTINUED] telmisartan-hydrochlorothiazide (MICARDIS HCT) 80-12.5 MG per tablet Take 1 tablet by mouth daily.       No current facility-administered medications on file prior to visit.    Allergies  Allergen Reactions   Erythromycin Other (See Comments)    "upset stomach" (02/17/2012)   Penicillins Other (See Comments)     ">50 yr ago, I had strept throat; had gotten lots and lots of PCN; last dose caused me to stop breathing" (02/17/2012)   Tetracycline Other (See Comments)    "upset stomach" (02/17/2012)   Sulfonamide Derivatives Rash and Other (See Comments)    "upset stomach" (02/17/2012)   Guaifenesin Er Other (See Comments)   Morphine Nausea And Vomiting   Sulfa Antibiotics Other (See Comments)   Sulfur    Social History   Occupational History   Not on file  Tobacco Use   Smoking status: Former    Years: 2.00    Types: Cigarettes    Quit date: 11/11/1969    Years since quitting: 51.7   Smokeless tobacco: Never  Vaping Use   Vaping Use:  Never used  Substance and Sexual Activity   Alcohol use: No   Drug use: No   Sexual activity: Not Currently   Family History  Problem Relation Age of Onset   Diabetes Mother    Angina Mother    Lung cancer Father    Other Sister        BRAIN TUMOR   Immunization History  Administered Date(s) Administered   Influenza Split 10/26/2013, 11/11/2017   Influenza Whole 12/16/2007, 12/15/2008, 11/14/2009   Influenza,inj,Quad PF,6+ Mos 11/07/2010   Influenza-Unspecified 12/13/2010, 12/13/2011, 11/10/2012, 10/12/2013   Moderna Sars-Covid-2 Vaccination 03/03/2019   PFIZER(Purple Top)SARS-COV-2 Vaccination 03/20/2019, 11/06/2019   Pneumococcal Conjugate-13 05/27/2013   Pneumococcal Polysaccharide-23 02/11/2005   Td 02/11/2005   Tdap 08/27/2005   Zoster, Live 05/01/2020     Review of Systems: Negative except as noted in the HPI.   Objective: There were no vitals filed for this visit.  Brittany Rojas is a pleasant 76 y.o. female in NAD. AAO X 3.  Vascular Examination: CFT <3 seconds b/l LE. Faintly palpable DP pulses b/l LE. Faintly palpable PT pulse(s) b/l LE. Pedal hair absent. No pain with calf compression b/l. Lower extremity skin temperature gradient within normal limits. No edema noted b/l LE. Varicosities present b/l.  Dermatological  Examination: Pedal integument with normal turgor, texture and tone BLE. No open wounds b/l LE. No interdigital macerations noted b/l LE. Toenails 1-5 b/l elongated, discolored, dystrophic, thickened, crumbly with subungual debris and tenderness to dorsal palpation. Hyperkeratotic lesion(s) IPJ b/l great toes and submet head 1 left foot.  No erythema, no edema, no drainage, no fluctuance. Preulcerative lesion noted submet head 2 right foot. There is visible subdermal hemorrhage. There is no surrounding erythema, no edema, no drainage, no odor, no fluctuance.  Neurological Examination: Pt has subjective symptoms of neuropathy. Protective sensation decreased with 10 gram monofilament b/l. Vibratory sensation diminished b/l. Proprioception intact bilaterally.  Musculoskeletal Examination: Normal muscle strength 5/5 to all lower extremity muscle groups bilaterally. HAV with bunion bilaterally and hammertoes 2-5 b/l. Pes planus deformity noted bilateral LE.Marland Kitchen No pain, crepitus or joint limitation noted with ROM b/l LE.  Patient ambulates independently without assistive aids.  Footwear Assessment: Does the patient wear appropriate shoes? Yes. Does the patient need inserts/orthotics? Yes.  Assessment: 1. Pain due to onychomycosis of toenails of both feet   2. Pre-ulcerative calluses   3. Hammertoe, bilateral   4. Hallux valgus with bunions, unspecified laterality   5. Diabetic polyneuropathy associated with type 2 diabetes mellitus ([place])   6. Encounter for diabetic foot exam ([place])      ADA Risk Categorization:  High Risk  Patient has one or more of the following: Loss of protective sensation Absent pedal pulses Severe Foot deformity History of foot ulcer  Plan: -Patient was evaluated and treated. All patient's and/or POA's questions/concerns answered on today's visit. -Diabetic foot examination performed today. -Discussed and educated patient on diabetic foot care, especially with  regards  to the vascular, neurological and musculoskeletal systems. -Continue diabetic shoes daily. -Discussed diabetic shoe benefit available based on patient's diagnoses. Patient would like to proceed. Order entered for one pair extra depth shoes and 3 pair custom molded insoles. Patient qualifies based on diagnoses. -Toenails 1-5 b/l were debrided in length and girth with sterile nail nippers and dremel without iatrogenic bleeding.  -Callus(es) bilateral great toes and submet head 1 left foot pared utilizing sterile scalpel blade without complication or incident. Total number debrided =3. -Preulcerative lesion pared submet  head 2 right foot. Total number pared=1. -Patient/POA to call should there be question/concern in the interim. Return in about 9 weeks (around 09/13/2021).  Marzetta Board, DPM

## 2021-07-23 DIAGNOSIS — H401131 Primary open-angle glaucoma, bilateral, mild stage: Secondary | ICD-10-CM | POA: Diagnosis not present

## 2021-07-23 DIAGNOSIS — E119 Type 2 diabetes mellitus without complications: Secondary | ICD-10-CM | POA: Diagnosis not present

## 2021-07-23 DIAGNOSIS — H25813 Combined forms of age-related cataract, bilateral: Secondary | ICD-10-CM | POA: Diagnosis not present

## 2021-08-08 ENCOUNTER — Ambulatory Visit (INDEPENDENT_AMBULATORY_CARE_PROVIDER_SITE_OTHER): Payer: Medicare Other | Admitting: Podiatry

## 2021-08-08 DIAGNOSIS — M2142 Flat foot [pes planus] (acquired), left foot: Secondary | ICD-10-CM

## 2021-08-08 DIAGNOSIS — M2041 Other hammer toe(s) (acquired), right foot: Secondary | ICD-10-CM

## 2021-08-08 DIAGNOSIS — E1142 Type 2 diabetes mellitus with diabetic polyneuropathy: Secondary | ICD-10-CM

## 2021-08-08 DIAGNOSIS — M2141 Flat foot [pes planus] (acquired), right foot: Secondary | ICD-10-CM

## 2021-08-08 DIAGNOSIS — M2042 Other hammer toe(s) (acquired), left foot: Secondary | ICD-10-CM

## 2021-08-08 NOTE — Progress Notes (Addendum)
Patient presented for foam casting for 3 pair custom diabetic shoe inserts. Patient is measured with a brannok device to be a size 8 medium  Diabetic shoes are chosen from the [employer] catalog and the shoes chosen are [id number]  The patient will be contacted when the shoes and insert are ready to be picked up

## 2021-08-09 ENCOUNTER — Telehealth: Payer: Self-pay | Admitting: Podiatry

## 2021-08-09 NOTE — Telephone Encounter (Signed)
[facility] called they would like to know if the NP  can sign the paper work for this patients diabetic shoes or can the MD that is in charge of the NP that the patient sees sign the paper work?

## 2021-08-20 DIAGNOSIS — H401131 Primary open-angle glaucoma, bilateral, mild stage: Secondary | ICD-10-CM | POA: Diagnosis not present

## 2021-08-21 DIAGNOSIS — G4733 Obstructive sleep apnea (adult) (pediatric): Secondary | ICD-10-CM | POA: Diagnosis not present

## 2021-09-20 ENCOUNTER — Encounter: Payer: Self-pay | Admitting: Podiatry

## 2021-09-20 ENCOUNTER — Ambulatory Visit (INDEPENDENT_AMBULATORY_CARE_PROVIDER_SITE_OTHER): Payer: Medicare Other | Admitting: Podiatry

## 2021-09-20 DIAGNOSIS — M79675 Pain in left toe(s): Secondary | ICD-10-CM

## 2021-09-20 DIAGNOSIS — M79674 Pain in right toe(s): Secondary | ICD-10-CM | POA: Diagnosis not present

## 2021-09-20 DIAGNOSIS — B351 Tinea unguium: Secondary | ICD-10-CM

## 2021-09-20 DIAGNOSIS — E1142 Type 2 diabetes mellitus with diabetic polyneuropathy: Secondary | ICD-10-CM

## 2021-09-20 DIAGNOSIS — L84 Corns and callosities: Secondary | ICD-10-CM

## 2021-09-24 DIAGNOSIS — H04129 Dry eye syndrome of unspecified lacrimal gland: Secondary | ICD-10-CM | POA: Diagnosis not present

## 2021-09-24 DIAGNOSIS — H401131 Primary open-angle glaucoma, bilateral, mild stage: Secondary | ICD-10-CM | POA: Diagnosis not present

## 2021-09-24 DIAGNOSIS — H02889 Meibomian gland dysfunction of unspecified eye, unspecified eyelid: Secondary | ICD-10-CM | POA: Diagnosis not present

## 2021-09-25 NOTE — Progress Notes (Signed)
Subjective:  Patient ID: Brittany Rojas, female    DOB: 1946/01/14,  MRN: [id number]  Brittany Rojas presents to clinic today for at risk foot care with history of diabetic neuropathy and preulcerative lesion(s) b/l lower extremities and painful mycotic toenails that limit ambulation. Painful toenails interfere with ambulation. Aggravating factors include wearing enclosed shoe gear. Pain is relieved with periodic professional debridement. Painful porokeratotic lesions are aggravated when weightbearing with and without shoegear. Pain is relieved with periodic professional debridement.  Patient states blood glucose was 239 mg/dl today.  Last A1c was 7.1%.  New problem(s): None.   PCP is Jacelyn Pi, MD , and last visit was  January 16, 2021  Allergies  Allergen Reactions   Erythromycin Other (See Comments)    "upset stomach" (02/17/2012)   Penicillins Other (See Comments)    ">50 yr ago, I had strept throat; had gotten lots and lots of PCN; last dose caused me to stop breathing" (02/17/2012)   Tetracycline Other (See Comments)    "upset stomach" (02/17/2012)   Sulfonamide Derivatives Rash and Other (See Comments)    "upset stomach" (02/17/2012)   Guaifenesin Er Other (See Comments)   Morphine Nausea And Vomiting   Sulfa Antibiotics Other (See Comments)   Sulfur     Review of Systems: Negative except as noted in the HPI.  Objective:   There were no vitals filed for this visit.  Brittany Rojas is a pleasant 76 y.o. female in NAD. AAO X 3.  Vascular Examination: CFT <3 seconds b/l LE. Faintly palpable DP pulses b/l LE. Faintly palpable PT pulse(s) b/l LE. Pedal hair absent. No pain with calf compression b/l. Lower extremity skin temperature gradient within normal limits. No edema noted b/l LE. Varicosities present b/l.  Dermatological Examination: Pedal integument with normal turgor, texture and tone BLE. No open wounds b/l LE. No interdigital macerations noted b/l LE. Toenails 1-5 b/l  elongated, discolored, dystrophic, thickened, crumbly with subungual debris and tenderness to dorsal palpation. Hyperkeratotic lesion(s) IPJ b/l great toes and submet head 1 left foot.  No erythema, no edema, no drainage, no fluctuance. Preulcerative lesion noted submet head 3 right foot. There is visible subdermal hemorrhage. There is no surrounding erythema, no edema, no drainage, no odor, no fluctuance.  Neurological Examination: Pt has subjective symptoms of neuropathy. Protective sensation decreased with 10 gram monofilament b/l. Vibratory sensation diminished b/l. Proprioception intact bilaterally.  Musculoskeletal Examination: Normal muscle strength 5/5 to all lower extremity muscle groups bilaterally. HAV with bunion bilaterally and hammertoes 2-5 b/l. Pes planus deformity noted bilateral LE.Marland Kitchen No pain, crepitus or joint limitation noted with ROM b/l LE.  Patient ambulates independently without assistive aids.  Assessment/Plan: 1. Pain due to onychomycosis of toenails of both feet   2. Pre-ulcerative calluses   3. Callus of foot   4. Diabetic polyneuropathy associated with type 2 diabetes mellitus ([place])   -Patient was evaluated and treated. All patient's and/or POA's questions/concerns answered on today's visit. -Offloaded right insole today. -Continue diabetic shoes daily. -Mycotic toenails 1-5 bilaterally were debrided in length and girth with sterile nail nippers and dremel without incident. -Callus(es) medial IPJ of left great toe, medial IPJ of right great toe, and submet head 1 left foot pared utilizing sterile scalpel blade without complication or incident. Total number debrided =3. -Preulcerative lesion pared submet head 3 right foot. Total number pared=1. -Patient/POA to call should there be question/concern in the interim.   Return in about 3 months (around 12/21/2021).  Brittany Malta  [name], DPM

## 2021-09-28 NOTE — Telephone Encounter (Signed)
Spoke with Brittany Rojas and informed her that the paperwork has been faxed over to Dr Almetta Lovely office and suggested that she contact them to inform them that it is coming over again. Will postpone and follow up in 2 weeks.

## 2021-10-05 ENCOUNTER — Telehealth: Payer: Self-pay | Admitting: Podiatry

## 2021-10-05 NOTE — Telephone Encounter (Signed)
Left message for patient to advise that we received paperwork from Dr. Chalmers Cater and that her shoes have been ordered

## 2021-10-08 DIAGNOSIS — E1142 Type 2 diabetes mellitus with diabetic polyneuropathy: Secondary | ICD-10-CM | POA: Diagnosis not present

## 2021-10-08 DIAGNOSIS — G4730 Sleep apnea, unspecified: Secondary | ICD-10-CM | POA: Diagnosis not present

## 2021-10-08 DIAGNOSIS — I10 Essential (primary) hypertension: Secondary | ICD-10-CM | POA: Diagnosis not present

## 2021-10-08 DIAGNOSIS — Z23 Encounter for immunization: Secondary | ICD-10-CM | POA: Diagnosis not present

## 2021-10-08 DIAGNOSIS — M1712 Unilateral primary osteoarthritis, left knee: Secondary | ICD-10-CM | POA: Diagnosis not present

## 2021-10-08 DIAGNOSIS — Z6833 Body mass index (BMI) 33.0-33.9, adult: Secondary | ICD-10-CM | POA: Diagnosis not present

## 2021-10-08 DIAGNOSIS — Z862 Personal history of diseases of the blood and blood-forming organs and certain disorders involving the immune mechanism: Secondary | ICD-10-CM | POA: Diagnosis not present

## 2021-10-08 DIAGNOSIS — I2510 Atherosclerotic heart disease of native coronary artery without angina pectoris: Secondary | ICD-10-CM | POA: Diagnosis not present

## 2021-10-08 DIAGNOSIS — E782 Mixed hyperlipidemia: Secondary | ICD-10-CM | POA: Diagnosis not present

## 2021-10-08 DIAGNOSIS — G629 Polyneuropathy, unspecified: Secondary | ICD-10-CM | POA: Diagnosis not present

## 2021-10-09 ENCOUNTER — Encounter: Payer: Self-pay | Admitting: Podiatry

## 2021-10-26 DIAGNOSIS — H25813 Combined forms of age-related cataract, bilateral: Secondary | ICD-10-CM | POA: Diagnosis not present

## 2021-10-26 DIAGNOSIS — H401131 Primary open-angle glaucoma, bilateral, mild stage: Secondary | ICD-10-CM | POA: Diagnosis not present

## 2021-11-03 DIAGNOSIS — Z23 Encounter for immunization: Secondary | ICD-10-CM | POA: Diagnosis not present

## 2021-11-10 DIAGNOSIS — Z23 Encounter for immunization: Secondary | ICD-10-CM | POA: Diagnosis not present

## 2021-11-20 DIAGNOSIS — E1136 Type 2 diabetes mellitus with diabetic cataract: Secondary | ICD-10-CM | POA: Diagnosis not present

## 2021-11-20 DIAGNOSIS — Z794 Long term (current) use of insulin: Secondary | ICD-10-CM | POA: Diagnosis not present

## 2021-11-20 DIAGNOSIS — E119 Type 2 diabetes mellitus without complications: Secondary | ICD-10-CM | POA: Diagnosis not present

## 2021-11-20 DIAGNOSIS — I10 Essential (primary) hypertension: Secondary | ICD-10-CM | POA: Diagnosis not present

## 2021-11-20 DIAGNOSIS — H25812 Combined forms of age-related cataract, left eye: Secondary | ICD-10-CM | POA: Diagnosis not present

## 2021-11-20 DIAGNOSIS — H259 Unspecified age-related cataract: Secondary | ICD-10-CM | POA: Diagnosis not present

## 2021-11-20 DIAGNOSIS — H401121 Primary open-angle glaucoma, left eye, mild stage: Secondary | ICD-10-CM | POA: Diagnosis not present

## 2021-11-24 ENCOUNTER — Telehealth: Payer: Self-pay | Admitting: Podiatry

## 2021-11-24 NOTE — Telephone Encounter (Signed)
LVM for pt to call back for an appt for DM SHOE PICK UP  

## 2021-11-26 DIAGNOSIS — E1142 Type 2 diabetes mellitus with diabetic polyneuropathy: Secondary | ICD-10-CM | POA: Diagnosis not present

## 2021-11-26 DIAGNOSIS — E782 Mixed hyperlipidemia: Secondary | ICD-10-CM | POA: Diagnosis not present

## 2021-11-26 DIAGNOSIS — I10 Essential (primary) hypertension: Secondary | ICD-10-CM | POA: Diagnosis not present

## 2021-11-27 DIAGNOSIS — Z01818 Encounter for other preprocedural examination: Secondary | ICD-10-CM | POA: Diagnosis not present

## 2021-11-27 DIAGNOSIS — H25812 Combined forms of age-related cataract, left eye: Secondary | ICD-10-CM | POA: Diagnosis not present

## 2021-11-28 DIAGNOSIS — E1140 Type 2 diabetes mellitus with diabetic neuropathy, unspecified: Secondary | ICD-10-CM | POA: Diagnosis not present

## 2021-11-28 DIAGNOSIS — I10 Essential (primary) hypertension: Secondary | ICD-10-CM | POA: Diagnosis not present

## 2021-11-28 DIAGNOSIS — E1165 Type 2 diabetes mellitus with hyperglycemia: Secondary | ICD-10-CM | POA: Diagnosis not present

## 2021-11-28 DIAGNOSIS — E7800 Pure hypercholesterolemia, unspecified: Secondary | ICD-10-CM | POA: Diagnosis not present

## 2021-12-04 DIAGNOSIS — Z0000 Encounter for general adult medical examination without abnormal findings: Secondary | ICD-10-CM | POA: Diagnosis not present

## 2021-12-04 DIAGNOSIS — G629 Polyneuropathy, unspecified: Secondary | ICD-10-CM | POA: Diagnosis not present

## 2021-12-04 DIAGNOSIS — I2510 Atherosclerotic heart disease of native coronary artery without angina pectoris: Secondary | ICD-10-CM | POA: Diagnosis not present

## 2021-12-04 DIAGNOSIS — E1142 Type 2 diabetes mellitus with diabetic polyneuropathy: Secondary | ICD-10-CM | POA: Diagnosis not present

## 2021-12-04 DIAGNOSIS — E782 Mixed hyperlipidemia: Secondary | ICD-10-CM | POA: Diagnosis not present

## 2021-12-04 DIAGNOSIS — E669 Obesity, unspecified: Secondary | ICD-10-CM | POA: Diagnosis not present

## 2021-12-04 DIAGNOSIS — Z1389 Encounter for screening for other disorder: Secondary | ICD-10-CM | POA: Diagnosis not present

## 2021-12-04 DIAGNOSIS — M1712 Unilateral primary osteoarthritis, left knee: Secondary | ICD-10-CM | POA: Diagnosis not present

## 2021-12-04 DIAGNOSIS — D72820 Lymphocytosis (symptomatic): Secondary | ICD-10-CM | POA: Diagnosis not present

## 2021-12-04 DIAGNOSIS — H401130 Primary open-angle glaucoma, bilateral, stage unspecified: Secondary | ICD-10-CM | POA: Diagnosis not present

## 2021-12-04 DIAGNOSIS — I10 Essential (primary) hypertension: Secondary | ICD-10-CM | POA: Diagnosis not present

## 2021-12-04 DIAGNOSIS — G4733 Obstructive sleep apnea (adult) (pediatric): Secondary | ICD-10-CM | POA: Diagnosis not present

## 2021-12-04 DIAGNOSIS — M1711 Unilateral primary osteoarthritis, right knee: Secondary | ICD-10-CM | POA: Diagnosis not present

## 2021-12-04 DIAGNOSIS — Z862 Personal history of diseases of the blood and blood-forming organs and certain disorders involving the immune mechanism: Secondary | ICD-10-CM | POA: Diagnosis not present

## 2021-12-06 ENCOUNTER — Encounter: Payer: Self-pay | Admitting: Podiatry

## 2021-12-06 ENCOUNTER — Ambulatory Visit (INDEPENDENT_AMBULATORY_CARE_PROVIDER_SITE_OTHER): Payer: Medicare Other | Admitting: Podiatry

## 2021-12-06 DIAGNOSIS — L84 Corns and callosities: Secondary | ICD-10-CM

## 2021-12-06 DIAGNOSIS — M79674 Pain in right toe(s): Secondary | ICD-10-CM

## 2021-12-06 DIAGNOSIS — E1142 Type 2 diabetes mellitus with diabetic polyneuropathy: Secondary | ICD-10-CM | POA: Diagnosis not present

## 2021-12-06 DIAGNOSIS — B351 Tinea unguium: Secondary | ICD-10-CM

## 2021-12-06 DIAGNOSIS — M79675 Pain in left toe(s): Secondary | ICD-10-CM | POA: Diagnosis not present

## 2021-12-06 NOTE — Progress Notes (Signed)
Subjective:  Patient ID: Brittany Rojas, female    DOB: June 11, 1945,  MRN: [id number]  Brittany Rojas presents to clinic today for at risk foot care with history of diabetic neuropathy, calluses b/l and preulcerative lesion(s) right lower extremity. Pain prevent(s) comfortable ambulation. Aggravating factor is weightbearing with and without shoegear.  Chief Complaint  Patient presents with   Callouses    Callous on right foot, near the toe area , pt states pain is some times excruciating    Nail Problem    DFC Diabetic  BG - 150 this morning  A1C - 8.3 10/19 PCP - Dr Theadore Nan , last OV was this week   . New problem(s): None.   PCP is Jacelyn Pi, MD.  Allergies  Allergen Reactions   Erythromycin Other (See Comments)    "upset stomach" (02/17/2012)   Penicillins Other (See Comments)    ">50 yr ago, I had strept throat; had gotten lots and lots of PCN; last dose caused me to stop breathing" (02/17/2012)   Tetracycline Other (See Comments)    "upset stomach" (02/17/2012)   Sulfonamide Derivatives Rash and Other (See Comments)    "upset stomach" (02/17/2012)   Guaifenesin Er Other (See Comments)   Morphine Nausea And Vomiting   Sulfa Antibiotics Other (See Comments)   Sulfur     Review of Systems: Negative except as noted in the HPI.  Objective: No changes noted in today's physical examination.  Brittany Rojas is a pleasant 76 y.o. female WD, WN in NAD. AAO x 3.  Vascular Examination: CFT <3 seconds b/l LE. Faintly palpable DP pulses b/l LE. Faintly palpable PT pulse(s) b/l LE. Pedal hair absent. No pain with calf compression b/l. Lower extremity skin temperature gradient within normal limits. No edema noted b/l LE. Varicosities present b/l.  Dermatological Examination: Pedal integument with normal turgor, texture and tone BLE. No open wounds b/l LE. No interdigital macerations noted b/l LE. Toenails 1-5 b/l elongated, discolored, dystrophic, thickened, crumbly with subungual  debris and tenderness to dorsal palpation.   Hyperkeratotic lesion(s) IPJ b/l great toes and submet head 1 left foot.  No erythema, no edema, no drainage, no fluctuance.   Preulcerative lesion noted submet head 3 right foot and submet head 2 right foot. There is visible subdermal hemorrhage. There is no surrounding erythema, no edema, no drainage, no odor, no fluctuance.  Neurological Examination: Pt has subjective symptoms of neuropathy. Protective sensation decreased with 10 gram monofilament b/l. Vibratory sensation diminished b/l. Proprioception intact bilaterally.  Musculoskeletal Examination: Normal muscle strength 5/5 to all lower extremity muscle groups bilaterally. HAV with bunion bilaterally and hammertoes 2-5 b/l. Pes planus deformity noted bilateral LE.Marland Kitchen No pain, crepitus or joint limitation noted with ROM b/l LE.  Patient ambulates independently without assistive aids.  Assessment/Plan: 1. Pain due to onychomycosis of toenails of both feet   2. Callus of foot   3. Pre-ulcerative calluses   4. Diabetic polyneuropathy associated with type 2 diabetes mellitus ([place])     No orders of the defined types were placed in this encounter.   -Consent given for treatment as described below: -Examined patient. -Continue diabetic foot care principles: inspect feet daily, monitor glucose as recommended by PCP and/or Endocrinologist, and follow prescribed diet per PCP, Endocrinologist and/or dietician. -Continue diabetic shoes daily. -Toenails 1-5 b/l were debrided in length and girth with sterile nail nippers and dremel without iatrogenic bleeding.  -Callus(es) medial IPJ of left great toe, medial IPJ of right great toe,  and submet head 1 left foot pared utilizing sterile scalpel blade without complication or incident. Total number debrided =3. -Preulcerative lesion pared submet head 2 right foot and submet head 3 right foot utilizing sterile scalpel blade. Total number pared=2. -Patient/POA  to call should there be question/concern in the interim.   Return in about 3 months (around 03/08/2022).  Marzetta Board, DPM

## 2021-12-25 DIAGNOSIS — E1136 Type 2 diabetes mellitus with diabetic cataract: Secondary | ICD-10-CM | POA: Diagnosis not present

## 2021-12-25 DIAGNOSIS — H25811 Combined forms of age-related cataract, right eye: Secondary | ICD-10-CM | POA: Diagnosis not present

## 2021-12-25 DIAGNOSIS — H259 Unspecified age-related cataract: Secondary | ICD-10-CM | POA: Diagnosis not present

## 2021-12-25 DIAGNOSIS — I10 Essential (primary) hypertension: Secondary | ICD-10-CM | POA: Diagnosis not present

## 2022-01-10 ENCOUNTER — Ambulatory Visit: Payer: Medicare Other | Admitting: Podiatry

## 2022-01-24 DIAGNOSIS — I2510 Atherosclerotic heart disease of native coronary artery without angina pectoris: Secondary | ICD-10-CM | POA: Diagnosis not present

## 2022-01-24 DIAGNOSIS — I10 Essential (primary) hypertension: Secondary | ICD-10-CM | POA: Diagnosis not present

## 2022-01-24 DIAGNOSIS — E1165 Type 2 diabetes mellitus with hyperglycemia: Secondary | ICD-10-CM | POA: Diagnosis not present

## 2022-01-24 DIAGNOSIS — E7800 Pure hypercholesterolemia, unspecified: Secondary | ICD-10-CM | POA: Diagnosis not present

## 2022-01-24 DIAGNOSIS — E1140 Type 2 diabetes mellitus with diabetic neuropathy, unspecified: Secondary | ICD-10-CM | POA: Diagnosis not present

## 2022-02-12 ENCOUNTER — Ambulatory Visit (INDEPENDENT_AMBULATORY_CARE_PROVIDER_SITE_OTHER): Payer: Medicare Other | Admitting: Podiatry

## 2022-02-12 DIAGNOSIS — E1142 Type 2 diabetes mellitus with diabetic polyneuropathy: Secondary | ICD-10-CM

## 2022-02-12 DIAGNOSIS — M2142 Flat foot [pes planus] (acquired), left foot: Secondary | ICD-10-CM

## 2022-02-12 DIAGNOSIS — M2141 Flat foot [pes planus] (acquired), right foot: Secondary | ICD-10-CM

## 2022-02-12 DIAGNOSIS — L84 Corns and callosities: Secondary | ICD-10-CM

## 2022-02-12 NOTE — Progress Notes (Signed)
Pt seen by katherine for DM shoe pick up

## 2022-02-21 DIAGNOSIS — I2510 Atherosclerotic heart disease of native coronary artery without angina pectoris: Secondary | ICD-10-CM | POA: Diagnosis not present

## 2022-02-21 DIAGNOSIS — I10 Essential (primary) hypertension: Secondary | ICD-10-CM | POA: Diagnosis not present

## 2022-02-21 DIAGNOSIS — E782 Mixed hyperlipidemia: Secondary | ICD-10-CM | POA: Diagnosis not present

## 2022-02-21 DIAGNOSIS — E1142 Type 2 diabetes mellitus with diabetic polyneuropathy: Secondary | ICD-10-CM | POA: Diagnosis not present

## 2022-03-01 ENCOUNTER — Ambulatory Visit: Payer: Medicare Other | Admitting: Cardiology

## 2022-03-14 ENCOUNTER — Ambulatory Visit (INDEPENDENT_AMBULATORY_CARE_PROVIDER_SITE_OTHER): Payer: Medicare Other | Admitting: Podiatry

## 2022-03-14 ENCOUNTER — Encounter: Payer: Self-pay | Admitting: Podiatry

## 2022-03-14 VITALS — BP 155/71

## 2022-03-14 DIAGNOSIS — M79674 Pain in right toe(s): Secondary | ICD-10-CM | POA: Diagnosis not present

## 2022-03-14 DIAGNOSIS — M79675 Pain in left toe(s): Secondary | ICD-10-CM

## 2022-03-14 DIAGNOSIS — E1142 Type 2 diabetes mellitus with diabetic polyneuropathy: Secondary | ICD-10-CM | POA: Diagnosis not present

## 2022-03-14 DIAGNOSIS — Q828 Other specified congenital malformations of skin: Secondary | ICD-10-CM

## 2022-03-14 DIAGNOSIS — B351 Tinea unguium: Secondary | ICD-10-CM

## 2022-03-14 DIAGNOSIS — L84 Corns and callosities: Secondary | ICD-10-CM

## 2022-03-14 NOTE — Progress Notes (Signed)
  Subjective:  Patient ID: Brittany Rojas, female    DOB: 1945/08/18,  MRN: [id number]  Brittany Rojas presents to clinic today for at risk foot care with history of diabetic neuropathy and preulcerative lesion(s) right lower extremity and painful mycotic toenails that limit ambulation. Painful toenails interfere with ambulation. Aggravating factors include wearing enclosed shoe gear. Pain is relieved with periodic professional debridement. Painful porokeratotic lesions are aggravated when weightbearing with and without shoegear. Pain is relieved with periodic professional debridement.  Chief Complaint  Patient presents with   Nail Problem    DFC BS-66 A1C-8.4 PCP-Balan, [place] PCP VST-01/2022   New problem(s): None.   PCP is Jacelyn Pi, MD.  Allergies  Allergen Reactions   Erythromycin Other (See Comments)    "upset stomach" (02/17/2012)   Penicillins Other (See Comments)    ">50 yr ago, I had strept throat; had gotten lots and lots of PCN; last dose caused me to stop breathing" (02/17/2012)   Tetracycline Other (See Comments)    "upset stomach" (02/17/2012)   Sulfonamide Derivatives Rash and Other (See Comments)    "upset stomach" (02/17/2012)   Guaifenesin Er Other (See Comments)   Morphine Nausea And Vomiting   Sulfa Antibiotics Other (See Comments)   Sulfur     Review of Systems: Negative except as noted in the HPI.  Objective: No changes noted in today's physical examination. Vitals:   03/14/22 1313  BP: (!) 155/71   Brittany Rojas is a pleasant 77 y.o. female obese in NAD. AAO x 3.  Vascular Examination: CFT <3 seconds b/l LE. Faintly palpable DP pulses b/l LE. Faintly palpable PT pulse(s) b/l LE. Pedal hair absent. No pain with calf compression b/l. Lower extremity skin temperature gradient within normal limits. No edema noted b/l LE. Varicosities present b/l.  Dermatological Examination: Pedal integument with normal turgor, texture and tone BLE. No open wounds b/l  LE. No interdigital macerations noted b/l LE. Toenails 1-5 b/l elongated, discolored, dystrophic, thickened, crumbly with subungual debris and tenderness to dorsal palpation.   Porokeratotic lesion(s) medial DIPJ of R 5th toe and submet head 2 right foot. No erythema, no edema, no drainage, no fluctuance.  Neurological Examination: Pt has subjective symptoms of neuropathy. Protective sensation decreased with 10 gram monofilament b/l. Vibratory sensation diminished b/l. Proprioception intact bilaterally.  Musculoskeletal Examination: Normal muscle strength 5/5 to all lower extremity muscle groups bilaterally. HAV with bunion bilaterally and hammertoes 2-5 b/l. Pes planus deformity noted bilateral LE.Marland Kitchen No pain, crepitus or joint limitation noted with ROM b/l LE.  Patient ambulates independently without assistive aids.  Assessment/Plan: 1. Pain due to onychomycosis of toenails of both feet   2. Porokeratosis   3. Diabetic polyneuropathy associated with type 2 diabetes mellitus ([place])     -Consent given for treatment as described below: -Examined patient. -Continue foot and shoe inspections daily. Monitor blood glucose per PCP/Endocrinologist's recommendations. -Toenails 1-5 b/l were debrided in length and girth with sterile nail nippers and dremel without iatrogenic bleeding.  -Porokeratotic lesion(s) medial IPJ of R 5th toe and submet head 2 right foot pared and enucleated with sterile currette without incident. Total number of lesions debrided=2. -Patient/POA to call should there be question/concern in the interim.   Return in about 3 months (around 06/12/2022).  Marzetta Board, DPM

## 2022-03-18 ENCOUNTER — Ambulatory Visit (INDEPENDENT_AMBULATORY_CARE_PROVIDER_SITE_OTHER): Payer: Medicare Other

## 2022-03-18 ENCOUNTER — Ambulatory Visit (INDEPENDENT_AMBULATORY_CARE_PROVIDER_SITE_OTHER): Payer: Medicare Other | Admitting: Podiatry

## 2022-03-18 DIAGNOSIS — M2142 Flat foot [pes planus] (acquired), left foot: Secondary | ICD-10-CM

## 2022-03-18 DIAGNOSIS — M2141 Flat foot [pes planus] (acquired), right foot: Secondary | ICD-10-CM | POA: Diagnosis not present

## 2022-03-18 DIAGNOSIS — Q828 Other specified congenital malformations of skin: Secondary | ICD-10-CM

## 2022-03-18 DIAGNOSIS — E1142 Type 2 diabetes mellitus with diabetic polyneuropathy: Secondary | ICD-10-CM

## 2022-03-18 DIAGNOSIS — M2041 Other hammer toe(s) (acquired), right foot: Secondary | ICD-10-CM | POA: Diagnosis not present

## 2022-03-18 DIAGNOSIS — M2042 Other hammer toe(s) (acquired), left foot: Secondary | ICD-10-CM

## 2022-03-18 NOTE — Progress Notes (Signed)
Subjective:  Patient ID: Brittany Rojas, female    DOB: [birth year],  MRN: [id number]  Chief Complaint  Patient presents with   Callouses    Porokeratotic lesion(s) medial IPJ of R 5th toe and submet head 2 right foot pared and enucleated/Diabetic     77 y.o. female presents for surgical consultation regarding chronic porokeratosis at the plantar aspect of the right forefoot.  She does have a history of diabetes type 2 takes insulin.  Her last A1c was approximately 8.0.  Recently had it checked in December.  She was referred by Dr. Adah Perl who has been routinely paring down the lesion in the past for pain relief to see if anything can be done surgically to prevent it from coming back.  Past Medical History:  Diagnosis Date   Bronchitis    "touch q now and then" (02/17/2012)   Depression    Diverticulitis    History of blood transfusion 1945   "when I was 58 days old" (02/17/2012)   History of transient ischemic attack (TIA) 2001   "possibly; never really pinned down what it was" (02/17/2012)   Hyperlipidemia    Hypertension    Migraines    "outgrew them I guess" (02/17/2012)   OSA on CPAP    Pneumonia 12/2010   Shortness of breath    "occasionally; at any time" (02/17/2012)   Type II diabetes mellitus (HCC)     Allergies  Allergen Reactions   Erythromycin Other (See Comments)    "upset stomach" (02/17/2012)   Penicillins Other (See Comments)    ">50 yr ago, I had strept throat; had gotten lots and lots of PCN; last dose caused me to stop breathing" (02/17/2012)   Tetracycline Other (See Comments)    "upset stomach" (02/17/2012)   Sulfonamide Derivatives Rash and Other (See Comments)    "upset stomach" (02/17/2012)   Guaifenesin Er Other (See Comments)   Morphine Nausea And Vomiting   Sulfa Antibiotics Other (See Comments)   Sulfur     ROS: Negative except as per HPI above  Objective:  General: AAO x3, NAD  Dermatological: Significant painful porokeratosis with hyperkeratotic tissue  formation and hyperkeratotic core at the central plantar aspect of the right foot underlying the second metatarsal head.  No open ulcerations present.  Significant pain with palpation of the lesion  Vascular:  Dorsalis Pedis artery and Posterior Tibial artery pedal pulses are 2/4 bilateral.  Capillary fill time < 3 sec to all digits.   Neruologic: Grossly intact via light touch bilateral. Protective threshold intact to all sites bilateral.   Musculoskeletal: Hammertoe deformity bilaterally as well as hallux abductovalgus deformity.  Gait: Unassisted, Nonantalgic.   No images are attached to the encounter.  Radiographs:  Date: 03/18/2022 XR the right foot Weightbearing AP/Lateral/Oblique   Findings: Attention directed to the metatarsophalangeal joints there is no to be significant arthritic changes with squaring and bony destruction of the second and third metatarsal heads there is hallux abductovalgus deformity present.  Severe rheumatoid foot structure Assessment:   1. Porokeratosis   2. Pes planus of both feet   3. Hammertoe, bilateral   4. Diabetic polyneuropathy associated with type 2 diabetes mellitus ([place])      Plan:  Patient was evaluated and treated and all questions answered.  # Porokeratosis right foot in the setting of pes planus hammertoe and bunion deformity and polyneuropathy associated with type 2 diabetes -Recommend proceeding with conservative care at this time including a gel padded sleeve for the  forefoot which was dispensed to the patient at this visit -Discussed possible conservative or surgical treatment options including the possibility of surgery to resect the second metatarsal head which I believe is causing most of the plantar prominence of this lesion to occur. -Discussed the risk benefits and possible alternatives to surgical intervention.  Patient is at much higher risk for surgery given her medical comorbidities including diabetes as well as respiratory  issues.  Her A1c level of 8.0+ is too high at this time to proceed with surgical intervention. -Patient will need to lower her A1c level to below 7.5 for me to consider proceeding with surgery and she would still be at high risk for wound healing complications postoperatively. -For now I recommend continued conservative care per Dr. Adah Perl and will consider surgical intervention in the future should she be able to get her A1c under control and desires surgery  No follow-ups on file.          Everitt Amber, DPM [facility]

## 2022-04-24 DIAGNOSIS — Z23 Encounter for immunization: Secondary | ICD-10-CM | POA: Diagnosis not present

## 2022-05-07 DIAGNOSIS — E7800 Pure hypercholesterolemia, unspecified: Secondary | ICD-10-CM | POA: Diagnosis not present

## 2022-05-07 DIAGNOSIS — I2510 Atherosclerotic heart disease of native coronary artery without angina pectoris: Secondary | ICD-10-CM | POA: Diagnosis not present

## 2022-05-07 DIAGNOSIS — E1140 Type 2 diabetes mellitus with diabetic neuropathy, unspecified: Secondary | ICD-10-CM | POA: Diagnosis not present

## 2022-05-07 DIAGNOSIS — I10 Essential (primary) hypertension: Secondary | ICD-10-CM | POA: Diagnosis not present

## 2022-05-07 DIAGNOSIS — E1165 Type 2 diabetes mellitus with hyperglycemia: Secondary | ICD-10-CM | POA: Diagnosis not present

## 2022-05-20 ENCOUNTER — Ambulatory Visit (INDEPENDENT_AMBULATORY_CARE_PROVIDER_SITE_OTHER): Payer: Medicare Other | Admitting: Podiatry

## 2022-05-20 DIAGNOSIS — M79675 Pain in left toe(s): Secondary | ICD-10-CM

## 2022-05-20 DIAGNOSIS — L84 Corns and callosities: Secondary | ICD-10-CM | POA: Diagnosis not present

## 2022-05-20 DIAGNOSIS — M2141 Flat foot [pes planus] (acquired), right foot: Secondary | ICD-10-CM

## 2022-05-20 DIAGNOSIS — M79674 Pain in right toe(s): Secondary | ICD-10-CM | POA: Diagnosis not present

## 2022-05-20 DIAGNOSIS — M2142 Flat foot [pes planus] (acquired), left foot: Secondary | ICD-10-CM

## 2022-05-20 DIAGNOSIS — B351 Tinea unguium: Secondary | ICD-10-CM | POA: Diagnosis not present

## 2022-05-20 DIAGNOSIS — E1142 Type 2 diabetes mellitus with diabetic polyneuropathy: Secondary | ICD-10-CM

## 2022-05-20 NOTE — Progress Notes (Signed)
  Subjective:  Patient ID: Brittany Rojas, female    DOB: 01-Aug-1945,  MRN: [id number]  Chief Complaint  Patient presents with   diabetic foot care     77 y.o. female presents with the above complaint. History confirmed with patient. Patient presenting with pain related to dystrophic thickened elongated nails. Patient is unable to trim own nails related to nail dystrophy and/or mobility issues. Patient does have a history of T2DM. Patient doeshave callus present located at the plantar aspect of the 2nd/3rd met head and 1st met head on the right foot causing pain.   Objective:  Physical Exam: warm, good capillary refill nail exam onychomycosis of the toenails, onycholysis, and dystrophic nails DP pulses palpable, PT pulses palpable, and protective sensation absent Left Foot:  Pain with palpation of nails due to elongation and dystrophic growth.  Right Foot: Pain with palpation of nails due to elongation and dystrophic growth.  Hyperkeratotic lesion at the plantar aspect of the second and third metatarsal head and the first metatarsal head on the right foot no underlying ulceration present.  There is prominent metatarsal bone present.   Radiographs:  Date: 03/18/2022 XR the right foot Weightbearing AP/Lateral/Oblique   Findings: Attention directed to the metatarsophalangeal joints there is no to be significant arthritic changes with squaring and bony destruction of the second and third metatarsal heads there is hallux abductovalgus deformity present.  Severe rheumatoid foot structure  Assessment:   1. Pre-ulcerative calluses   2. Pain due to onychomycosis of toenails of both feet   3. Pes planus of both feet   4. Diabetic polyneuropathy associated with type 2 diabetes mellitus      Plan:  Patient was evaluated and treated and all questions answered.  #Hyperkeratotic lesions/pre ulcerative calluses present subthird metatarsal head and first metatarsal head on the right foot All  symptomatic hyperkeratoses x 2 separate lesions were safely debrided with a sterile #10 blade to patient's level of comfort without incident. We discussed preventative and palliative care of these lesions including supportive and accommodative shoegear, padding, prefabricated and custom molded accommodative orthoses, use of a pumice stone and lotions/creams daily.  #Onychomycosis with pain  -Nails palliatively debrided as below. -Educated on self-care  Procedure: Nail Debridement Rationale: Pain Type of Debridement: manual, sharp debridement. Instrumentation: Nail nipper, rotary burr. Number of Nails: 10  Return in about 9 weeks (around 07/22/2022) for [facility] and discuss surgery right foot metatarsal resection.         Corinna Gab, DPM [facility] / [facility]

## 2022-05-21 DIAGNOSIS — I2510 Atherosclerotic heart disease of native coronary artery without angina pectoris: Secondary | ICD-10-CM | POA: Diagnosis not present

## 2022-05-21 DIAGNOSIS — E1165 Type 2 diabetes mellitus with hyperglycemia: Secondary | ICD-10-CM | POA: Diagnosis not present

## 2022-05-21 DIAGNOSIS — E782 Mixed hyperlipidemia: Secondary | ICD-10-CM | POA: Diagnosis not present

## 2022-05-21 DIAGNOSIS — I10 Essential (primary) hypertension: Secondary | ICD-10-CM | POA: Diagnosis not present

## 2022-05-23 ENCOUNTER — Encounter: Payer: Self-pay | Admitting: Cardiology

## 2022-05-23 ENCOUNTER — Ambulatory Visit: Payer: Medicare Other | Attending: Cardiology | Admitting: Cardiology

## 2022-05-23 VITALS — BP 140/60 | HR 66 | Ht 62.5 in | Wt 192.0 lb

## 2022-05-23 DIAGNOSIS — E782 Mixed hyperlipidemia: Secondary | ICD-10-CM | POA: Insufficient documentation

## 2022-05-23 DIAGNOSIS — I10 Essential (primary) hypertension: Secondary | ICD-10-CM | POA: Diagnosis not present

## 2022-05-23 DIAGNOSIS — I2510 Atherosclerotic heart disease of native coronary artery without angina pectoris: Secondary | ICD-10-CM | POA: Insufficient documentation

## 2022-05-23 NOTE — Patient Instructions (Signed)
Medication Instructions:  The current medical regimen is effective;  continue present plan and medications.  *If you need a refill on your cardiac medications before your next appointment, please call your pharmacy*  Follow-Up: At [facility], you and your health needs are our priority.  As part of our continuing mission to provide you with exceptional heart care, we have created designated Provider Care Teams.  These Care Teams include your primary Cardiologist (physician) and Advanced Practice Providers (APPs -  Physician Assistants and Nurse Practitioners) who all work together to provide you with the care you need, when you need it.  We recommend signing up for the patient portal called "MyChart".  Sign up information is provided on this After Visit Summary.  MyChart is used to connect with patients for Virtual Visits (Telemedicine).  Patients are able to view lab/test results, encounter notes, upcoming appointments, etc.  Non-urgent messages can be sent to your provider as well.   To learn more about what you can do with MyChart, go to [email or web address].    Your next appointment:   1 year(s)  Provider:   Mark Skains, MD      

## 2022-05-23 NOTE — Progress Notes (Signed)
Cardiology Office Note:    Date:  05/23/2022   ID:  Brittany Rojas, DOB 08-Jan-1946, MRN [id number]  PCP:  Gweneth DimitriMcNeill, Wendy, MD   [facility] Providers Cardiologist:  Donato SchultzMark Caroleen Stoermer, MD     Referring MD: Dorisann FramesBalan, Bindubal, MD    History of Present Illness:    Brittany Rojas is a 77 y.o. female here for follow-up of coronary artery disease status post obtuse marginal drug-eluting stent in 2014 with comorbidities of hypertension and obstructive sleep apnea and hyperlipidemia.  In 2022 she had a prolonged hospital stay in [place].  Urinary sepsis encephalopathy.  Past Medical History:  Diagnosis Date   Bronchitis    "touch q now and then" (02/17/2012)   Depression    Diverticulitis    History of blood transfusion 1945   "when I was 289 days old" (02/17/2012)   History of transient ischemic attack (TIA) 2001   "possibly; never really pinned down what it was" (02/17/2012)   Hyperlipidemia    Hypertension    Migraines    "outgrew them I guess" (02/17/2012)   OSA on CPAP    Pneumonia 12/2010   Shortness of breath    "occasionally; at any time" (02/17/2012)   Type II diabetes mellitus     Past Surgical History:  Procedure Laterality Date   ABDOMINAL HYSTERECTOMY  2000's   APPENDECTOMY  04/1988   CARDIAC CATHETERIZATION  02/13/2012   COLECTOMY  01/18/1988   "took 3 feet out; result of lots of burst diverticula" (02/17/2012)   COLECTOMY  04/1988   "9 more inches of colon" (02/17/2012)   COLOSTOMY  01/18/1988   COLOSTOMY REVERSAL  04/1988   CORONARY ANGIOPLASTY WITH STENT PLACEMENT  02/17/2012   "1; first one ever" (02/17/2012)   LEFT OOPHORECTOMY  1989   PERCUTANEOUS CORONARY STENT INTERVENTION (PCI-S) N/A 02/17/2012   Procedure: PERCUTANEOUS CORONARY STENT INTERVENTION (PCI-S);  Surgeon: Lesleigh NoeHenry W Smith III, MD;  [facility];  Service: Cardiovascular;  Laterality: N/A;    Current Medications: Current Meds  Medication Sig   albuterol (VENTOLIN HFA) 108 (90 Base) MCG/ACT inhaler 2 puffs  as needed   amLODipine (NORVASC) 5 MG tablet Take 5 mg by mouth daily.   aspirin 81 MG tablet Take 81 mg by mouth daily.   atorvastatin (LIPITOR) 80 MG tablet Take 80 mg by mouth daily.    Continuous Blood Gluc Receiver (FREESTYLE LIBRE 2 READER) DEVI See admin instructions.   Continuous Blood Gluc Sensor (FREESTYLE LIBRE 14 DAY SENSOR) MISC 1 each by Does not apply route every 14 (fourteen) days.   diclofenac sodium (VOLTAREN) 1 % GEL APPLY 2 GRAMS TO AREA UP TO 4 TIMES DAILY   doxycycline (VIBRAMYCIN) 50 MG capsule Take by mouth.   gabapentin (NEURONTIN) 300 MG capsule TAKE 3 CAPSULES BY MOUTH AT BEDTIME   insulin lispro (HUMALOG) 100 UNIT/ML injection 5u-10u   insulin NPH Human (HUMULIN N,NOVOLIN N) 100 UNIT/ML injection Inject 10 units into the skin each morning and 65 units into the skin each evening.   insulin regular (NOVOLIN R,HUMULIN R) 100 units/mL injection Use as directed per scale three (3) times daily.   losartan-hydrochlorothiazide (HYZAAR) 100-25 MG tablet Take 1 tablet by mouth daily.   Multiple Vitamins-Minerals (MULTI FOR HER 50+) TABS See admin instructions.   nitroGLYCERIN (NITROSTAT) 0.4 MG SL tablet Place 1 tablet (0.4 mg total) under the tongue every 5 (five) minutes as needed for chest pain.   traMADol (ULTRAM) 50 MG tablet Take one (  1) tablet (50 mg) by mouth every eight (8) to twelve (12) hours as needed for pain.     Allergies:   Erythromycin, Penicillins, Tetracycline, Sulfonamide derivatives, Guaifenesin er, Morphine, Sulfa antibiotics, and Sulfur   Social History   Socioeconomic History   Marital status: Married    Spouse name: Not on file   Number of children: Not on file   Years of education: Not on file   Highest education level: Not on file  Occupational History   Not on file  Tobacco Use   Smoking status: Former    Years: 2    Types: Cigarettes    Quit date: 11/11/1969    Years since quitting: 52.5   Smokeless tobacco: Never  Vaping Use    Vaping Use: Never used  Substance and Sexual Activity   Alcohol use: No   Drug use: No   Sexual activity: Not Currently  Other Topics Concern   Not on file  Social History Narrative   Not on file   Social Determinants of Health   Financial Resource Strain: Not on file  Food Insecurity: Not on file  Transportation Needs: Not on file  Physical Activity: Not on file  Stress: Not on file  Social Connections: Not on file     Family History: The patient's family history includes Angina in her mother; Diabetes in her mother; Lung cancer in her father; Other in her sister.  ROS:   Please see the history of present illness.     All other systems reviewed and are negative.  EKGs/Labs/Other Studies Reviewed:    The following studies were reviewed today: Cardiac Studies & Procedures     STRESS TESTS  MYOCARDIAL PERFUSION IMAGING 01/14/2020  Narrative  The left ventricular ejection fraction is hyperdynamic (>65%).  Nuclear stress EF: 80%.  There was no ST segment deviation noted during stress.  The study is normal.  This is a low risk study.  Low risk stress nuclear study with normal perfusion and normal left ventricular regional and global systolic function.               EKG:  The ekg ordered today demonstrates normal sinus rhythm sinus arrhythmia right bundle branch block 66 bpm    Recent Labs: No results found for requested labs within last 365 days.  Recent Lipid Panel No results found for: "CHOL", "TRIG", "HDL", "CHOLHDL", "VLDL", "LDLCALC", "LDLDIRECT"   Risk Assessment/Calculations:              Physical Exam:    VS:  BP (!) 140/60 (BP Location: Left Arm, Patient Position: Sitting, Cuff Size: Normal)   Pulse 66   Ht 5' 2.5" (1.588 m)   Wt 192 lb (87.1 kg)   BMI 34.56 kg/m     Wt Readings from Last 3 Encounters:  05/23/22 192 lb (87.1 kg)  01/08/21 200 lb 12.8 oz (91.1 kg)  01/03/20 194 lb 9.6 oz (88.3 kg)     GEN:  Well nourished, well  developed in no acute distress HEENT: Normal NECK: No JVD; No carotid bruits LYMPHATICS: No lymphadenopathy CARDIAC: RRR, no murmurs, rubs, gallops RESPIRATORY:  Clear to auscultation without rales, wheezing or rhonchi  ABDOMEN: Soft, non-tender, non-distended MUSCULOSKELETAL:  No edema; No deformity  SKIN: Warm and dry NEUROLOGIC:  Alert and oriented x 3 PSYCHIATRIC:  Normal affect   ASSESSMENT:    1. Atherosclerosis of native coronary artery of native heart without angina pectoris   2. Essential hypertension   3.  Mixed hyperlipidemia    PLAN:    In order of problems listed above:  Coronary artery disease - Prior obtuse marginal stent.  Continue with goal-directed medical therapy statin, blood pressure control aspirin.  Hypertension - Cozaar 50 amlodipine 5.  Diuretic had been discontinued.  Diabetes with hypertension - Managed by endocrinology.  Stable.  Obstructive sleep apnea - Continue to encourage CPAP use.           Medication Adjustments/Labs and Tests Ordered: Current medicines are reviewed at length with the patient today.  Concerns regarding medicines are outlined above.  Orders Placed This Encounter  Procedures   EKG 12-Lead   No orders of the defined types were placed in this encounter.   Patient Instructions  Medication Instructions:  The current medical regimen is effective;  continue present plan and medications.  *If you need a refill on your cardiac medications before your next appointment, please call your pharmacy*   Follow-Up: At [facility], you and your health needs are our priority.  As part of our continuing mission to provide you with exceptional heart care, we have created designated Provider Care Teams.  These Care Teams include your primary Cardiologist (physician) and Advanced Practice Providers (APPs -  Physician Assistants and Nurse Practitioners) who all work together to provide you with the care you need, when you  need it.  We recommend signing up for the patient portal called "MyChart".  Sign up information is provided on this After Visit Summary.  MyChart is used to connect with patients for Virtual Visits (Telemedicine).  Patients are able to view lab/test results, encounter notes, upcoming appointments, etc.  Non-urgent messages can be sent to your provider as well.   To learn more about what you can do with MyChart, go to [email or web address].    Your next appointment:   1 year(s)  Provider:   Donato Schultz, MD        Signed, Donato Schultz, MD  05/23/2022 5:25 PM    [facility]

## 2022-06-06 DIAGNOSIS — C4441 Basal cell carcinoma of skin of scalp and neck: Secondary | ICD-10-CM | POA: Diagnosis not present

## 2022-06-06 DIAGNOSIS — L821 Other seborrheic keratosis: Secondary | ICD-10-CM | POA: Diagnosis not present

## 2022-06-06 DIAGNOSIS — L814 Other melanin hyperpigmentation: Secondary | ICD-10-CM | POA: Diagnosis not present

## 2022-06-10 DIAGNOSIS — H401130 Primary open-angle glaucoma, bilateral, stage unspecified: Secondary | ICD-10-CM | POA: Diagnosis not present

## 2022-06-10 DIAGNOSIS — I10 Essential (primary) hypertension: Secondary | ICD-10-CM | POA: Diagnosis not present

## 2022-06-10 DIAGNOSIS — G4733 Obstructive sleep apnea (adult) (pediatric): Secondary | ICD-10-CM | POA: Diagnosis not present

## 2022-06-10 DIAGNOSIS — E1142 Type 2 diabetes mellitus with diabetic polyneuropathy: Secondary | ICD-10-CM | POA: Diagnosis not present

## 2022-06-10 DIAGNOSIS — I2510 Atherosclerotic heart disease of native coronary artery without angina pectoris: Secondary | ICD-10-CM | POA: Diagnosis not present

## 2022-06-10 DIAGNOSIS — Z862 Personal history of diseases of the blood and blood-forming organs and certain disorders involving the immune mechanism: Secondary | ICD-10-CM | POA: Diagnosis not present

## 2022-06-10 DIAGNOSIS — E782 Mixed hyperlipidemia: Secondary | ICD-10-CM | POA: Diagnosis not present

## 2022-06-10 DIAGNOSIS — D72820 Lymphocytosis (symptomatic): Secondary | ICD-10-CM | POA: Diagnosis not present

## 2022-06-10 DIAGNOSIS — G629 Polyneuropathy, unspecified: Secondary | ICD-10-CM | POA: Diagnosis not present

## 2022-06-10 DIAGNOSIS — Z6834 Body mass index (BMI) 34.0-34.9, adult: Secondary | ICD-10-CM | POA: Diagnosis not present

## 2022-06-13 DIAGNOSIS — H6123 Impacted cerumen, bilateral: Secondary | ICD-10-CM | POA: Diagnosis not present

## 2022-06-17 DIAGNOSIS — C4441 Basal cell carcinoma of skin of scalp and neck: Secondary | ICD-10-CM | POA: Diagnosis not present

## 2022-07-11 DIAGNOSIS — M1711 Unilateral primary osteoarthritis, right knee: Secondary | ICD-10-CM | POA: Diagnosis not present

## 2022-07-11 DIAGNOSIS — M25561 Pain in right knee: Secondary | ICD-10-CM | POA: Diagnosis not present

## 2022-07-18 DIAGNOSIS — M25561 Pain in right knee: Secondary | ICD-10-CM | POA: Diagnosis not present

## 2022-07-22 ENCOUNTER — Ambulatory Visit (INDEPENDENT_AMBULATORY_CARE_PROVIDER_SITE_OTHER): Payer: Medicare Other | Admitting: Podiatry

## 2022-07-22 DIAGNOSIS — M79675 Pain in left toe(s): Secondary | ICD-10-CM | POA: Diagnosis not present

## 2022-07-22 DIAGNOSIS — M79674 Pain in right toe(s): Secondary | ICD-10-CM

## 2022-07-22 DIAGNOSIS — B351 Tinea unguium: Secondary | ICD-10-CM

## 2022-07-22 DIAGNOSIS — E1142 Type 2 diabetes mellitus with diabetic polyneuropathy: Secondary | ICD-10-CM

## 2022-07-22 DIAGNOSIS — L84 Corns and callosities: Secondary | ICD-10-CM

## 2022-07-22 NOTE — Progress Notes (Signed)
  Subjective:  Patient ID: Brittany Rojas, female    DOB: 04/14/1945,  MRN: [id number]  Chief Complaint  Patient presents with   Nail Problem    Diabetic Foot Care     77 y.o. female presents with the above complaint. History confirmed with patient. Patient presenting with pain related to dystrophic thickened elongated nails. Patient is unable to trim own nails related to nail dystrophy and/or mobility issues. Patient does have a history of T2DM. Patient doeshave callus present located at the plantar aspect of the 2nd/3rd met head and 1st met head on the right foot causing pain.   Objective:  Physical Exam: warm, good capillary refill nail exam onychomycosis of the toenails, onycholysis, and dystrophic nails DP pulses palpable, PT pulses palpable, and protective sensation absent Left Foot:  Pain with palpation of nails due to elongation and dystrophic growth.  Right Foot: Pain with palpation of nails due to elongation and dystrophic growth.  Hyperkeratotic lesion at the plantar aspect of the second and third metatarsal head and the first metatarsal head on the right foot no underlying ulceration present.  There is prominent metatarsal bone present.   Radiographs:  Date: 03/18/2022 XR the right foot Weightbearing AP/Lateral/Oblique   Findings: Attention directed to the metatarsophalangeal joints there is no to be significant arthritic changes with squaring and bony destruction of the second and third metatarsal heads there is hallux abductovalgus deformity present.  Severe rheumatoid foot structure  Assessment:   1. Pre-ulcerative calluses   2. Pain due to onychomycosis of toenails of both feet   3. Diabetic polyneuropathy associated with type 2 diabetes mellitus (HCC)       Plan:  Patient was evaluated and treated and all questions answered.  #Hyperkeratotic lesions/pre ulcerative calluses present subthird metatarsal head and first metatarsal head on the right foot All symptomatic  hyperkeratoses x 2 separate lesions were safely debrided with a sterile #10 blade to patient's level of comfort without incident. We discussed preventative and palliative care of these lesions including supportive and accommodative shoegear, padding, prefabricated and custom molded accommodative orthoses, use of a pumice stone and lotions/creams daily.  #Onychomycosis with pain  -Nails palliatively debrided as below. -Educated on self-care  Procedure: Nail Debridement Rationale: Pain Type of Debridement: manual, sharp debridement. Instrumentation: Nail nipper, rotary burr. Number of Nails: 10  No follow-ups on file.         Corinna Gab, DPM [facility] / [facility]

## 2022-07-25 DIAGNOSIS — S82831A Other fracture of upper and lower end of right fibula, initial encounter for closed fracture: Secondary | ICD-10-CM | POA: Diagnosis not present

## 2022-08-05 DIAGNOSIS — H18523 Epithelial (juvenile) corneal dystrophy, bilateral: Secondary | ICD-10-CM | POA: Diagnosis not present

## 2022-08-22 DIAGNOSIS — S82831A Other fracture of upper and lower end of right fibula, initial encounter for closed fracture: Secondary | ICD-10-CM | POA: Diagnosis not present

## 2022-08-26 DIAGNOSIS — E1165 Type 2 diabetes mellitus with hyperglycemia: Secondary | ICD-10-CM | POA: Diagnosis not present

## 2022-08-26 DIAGNOSIS — G4733 Obstructive sleep apnea (adult) (pediatric): Secondary | ICD-10-CM | POA: Diagnosis not present

## 2022-08-26 DIAGNOSIS — I10 Essential (primary) hypertension: Secondary | ICD-10-CM | POA: Diagnosis not present

## 2022-09-10 DIAGNOSIS — I2510 Atherosclerotic heart disease of native coronary artery without angina pectoris: Secondary | ICD-10-CM | POA: Diagnosis not present

## 2022-09-10 DIAGNOSIS — E1140 Type 2 diabetes mellitus with diabetic neuropathy, unspecified: Secondary | ICD-10-CM | POA: Diagnosis not present

## 2022-09-10 DIAGNOSIS — E1165 Type 2 diabetes mellitus with hyperglycemia: Secondary | ICD-10-CM | POA: Diagnosis not present

## 2022-09-10 DIAGNOSIS — I10 Essential (primary) hypertension: Secondary | ICD-10-CM | POA: Diagnosis not present

## 2022-09-10 DIAGNOSIS — E7800 Pure hypercholesterolemia, unspecified: Secondary | ICD-10-CM | POA: Diagnosis not present

## 2022-09-18 DIAGNOSIS — L570 Actinic keratosis: Secondary | ICD-10-CM | POA: Diagnosis not present

## 2022-09-18 DIAGNOSIS — C4441 Basal cell carcinoma of skin of scalp and neck: Secondary | ICD-10-CM | POA: Diagnosis not present

## 2022-09-23 ENCOUNTER — Ambulatory Visit (INDEPENDENT_AMBULATORY_CARE_PROVIDER_SITE_OTHER): Payer: Medicare Other | Admitting: Podiatry

## 2022-09-23 DIAGNOSIS — M79675 Pain in left toe(s): Secondary | ICD-10-CM | POA: Diagnosis not present

## 2022-09-23 DIAGNOSIS — L84 Corns and callosities: Secondary | ICD-10-CM

## 2022-09-23 DIAGNOSIS — M79674 Pain in right toe(s): Secondary | ICD-10-CM

## 2022-09-23 DIAGNOSIS — B351 Tinea unguium: Secondary | ICD-10-CM | POA: Diagnosis not present

## 2022-09-23 DIAGNOSIS — E1142 Type 2 diabetes mellitus with diabetic polyneuropathy: Secondary | ICD-10-CM

## 2022-09-23 NOTE — Progress Notes (Signed)
  Subjective:  Patient ID: Brittany Rojas, female    DOB: [birth year],  MRN: [id number]  Chief Complaint  Patient presents with   Nail Problem    Diabetic Foot Care-nail trim    Callouses    Callus trim to right foot.     77 y.o. female presents with the above complaint. History confirmed with patient. Patient presenting with pain related to dystrophic thickened elongated nails. Patient is unable to trim own nails related to nail dystrophy and/or mobility issues. Patient does have a history of T2DM. Patient doeshave callus present located at the plantar aspect of the 2nd/3rd met head and 1st met head on the right foot causing pain.   Objective:  Physical Exam: warm, good capillary refill nail exam onychomycosis of the toenails, onycholysis, and dystrophic nails DP pulses palpable, PT pulses palpable, and protective sensation absent Left Foot:  Pain with palpation of nails due to elongation and dystrophic growth.  Right Foot: Pain with palpation of nails due to elongation and dystrophic growth.  Hyperkeratotic lesion at the plantar aspect of the second and third metatarsal head and the first metatarsal head on the right foot no underlying ulceration present.  There is prominent metatarsal bone present.   Radiographs:  Date: 03/18/2022 XR the right foot Weightbearing AP/Lateral/Oblique   Findings: Attention directed to the metatarsophalangeal joints there is no to be significant arthritic changes with squaring and bony destruction of the second and third metatarsal heads there is hallux abductovalgus deformity present.  Severe rheumatoid foot structure  Assessment:   1. Pre-ulcerative calluses   2. Pain due to onychomycosis of toenails of both feet   3. Diabetic polyneuropathy associated with type 2 diabetes mellitus (HCC)        Plan:  Patient was evaluated and treated and all questions answered.  #Hyperkeratotic lesions/pre ulcerative calluses present subthird metatarsal head and  first metatarsal head on the right foot All symptomatic hyperkeratoses x 2 separate lesions were safely debrided with a sterile #10 blade to patient's level of comfort without incident. We discussed preventative and palliative care of these lesions including supportive and accommodative shoegear, padding, prefabricated and custom molded accommodative orthoses, use of a pumice stone and lotions/creams daily.  #Onychomycosis with pain  -Nails palliatively debrided as below. -Educated on self-care  Procedure: Nail Debridement Rationale: Pain Type of Debridement: manual, sharp debridement. Instrumentation: Nail nipper, rotary burr. Number of Nails: 10  Return in about 3 months (around 12/24/2022) for [facility].         Corinna Gab, DPM [facility] / [facility]

## 2022-10-01 DIAGNOSIS — R202 Paresthesia of skin: Secondary | ICD-10-CM | POA: Diagnosis not present

## 2022-10-01 DIAGNOSIS — R200 Anesthesia of skin: Secondary | ICD-10-CM | POA: Diagnosis not present

## 2022-10-01 DIAGNOSIS — M79641 Pain in right hand: Secondary | ICD-10-CM | POA: Diagnosis not present

## 2022-10-31 ENCOUNTER — Telehealth: Payer: Self-pay | Admitting: Podiatry

## 2022-10-31 NOTE — Telephone Encounter (Signed)
Patient called, noting she doesn't have next appt until 12/05/22, but her handicap parking placard expires next week.  Was requesting a renewal.  I sent [name] a message and she will take care of it.  Patient's husband is near the [place] office today and plans to pick it up this afternoon (she's an [facility] patient).  -Brittany Rojas

## 2022-11-02 DIAGNOSIS — Z23 Encounter for immunization: Secondary | ICD-10-CM | POA: Diagnosis not present

## 2022-11-25 ENCOUNTER — Ambulatory Visit: Payer: Medicare Other | Admitting: Podiatry

## 2022-12-02 ENCOUNTER — Encounter: Payer: Self-pay | Admitting: Podiatry

## 2022-12-02 ENCOUNTER — Ambulatory Visit (INDEPENDENT_AMBULATORY_CARE_PROVIDER_SITE_OTHER): Payer: Medicare Other | Admitting: Podiatry

## 2022-12-02 DIAGNOSIS — M79675 Pain in left toe(s): Secondary | ICD-10-CM

## 2022-12-02 DIAGNOSIS — M79674 Pain in right toe(s): Secondary | ICD-10-CM | POA: Diagnosis not present

## 2022-12-02 DIAGNOSIS — L84 Corns and callosities: Secondary | ICD-10-CM | POA: Diagnosis not present

## 2022-12-02 DIAGNOSIS — E1142 Type 2 diabetes mellitus with diabetic polyneuropathy: Secondary | ICD-10-CM | POA: Diagnosis not present

## 2022-12-02 DIAGNOSIS — B351 Tinea unguium: Secondary | ICD-10-CM | POA: Diagnosis not present

## 2022-12-02 NOTE — Progress Notes (Signed)
Subjective:  Patient ID: Brittany Rojas, female    DOB: November 20, 1945,  MRN: [id number]  Chief Complaint  Patient presents with   Diabetic Foot Care    Nails and callus care. Callus sub 1 and 2 right foot. Last A1c in the 7s    77 y.o. female presents with the above complaint. History confirmed with patient. Patient presenting with pain related to dystrophic thickened elongated nails. Patient is unable to trim own nails related to nail dystrophy and/or mobility issues. Patient does have a history of T2DM. Patient doeshave callus present located at the plantar aspect of the 2nd/3rd met head and 1st met head on the right foot causing pain.   Objective:  Physical Exam: warm, good capillary refill nail exam onychomycosis of the toenails, onycholysis, and dystrophic nails DP pulses palpable, PT pulses palpable, and protective sensation absent Left Foot:  Pain with palpation of nails due to elongation and dystrophic growth.  Right Foot: Pain with palpation of nails due to elongation and dystrophic growth.  Hyperkeratotic lesion at the plantar aspect of the second and third metatarsal head and the first metatarsal head on the right foot no underlying ulceration present.  There is prominent metatarsal bone present.   Radiographs:  Date: 03/18/2022 XR the right foot Weightbearing AP/Lateral/Oblique   Findings: Attention directed to the metatarsophalangeal joints there is no to be significant arthritic changes with squaring and bony destruction of the second and third metatarsal heads there is hallux abductovalgus deformity present.  Severe rheumatoid foot structure  Assessment:   1. Pre-ulcerative calluses   2. Pain due to onychomycosis of toenails of both feet   3. Diabetic polyneuropathy associated with type 2 diabetes mellitus (HCC)      Plan:  Patient was evaluated and treated and all questions answered.  #Hyperkeratotic lesions/pre ulcerative calluses present subthird metatarsal head and  first metatarsal head on the right foot All symptomatic hyperkeratoses x 2 separate lesions were safely debrided with a sterile #10 blade to patient's level of comfort without incident. We discussed preventative and palliative care of these lesions including supportive and accommodative shoegear, padding, prefabricated and custom molded accommodative orthoses, use of a pumice stone and lotions/creams daily.  #Onychomycosis with pain  -Nails palliatively debrided as below. -Educated on self-care  Procedure: Nail Debridement Rationale: Pain Type of Debridement: manual, sharp debridement. Instrumentation: Nail nipper, rotary burr. Number of Nails: 10  Return in about 3 months (around 03/04/2023) for [facility].         Corinna Gab, DPM [facility] / [facility]

## 2022-12-16 DIAGNOSIS — Z1331 Encounter for screening for depression: Secondary | ICD-10-CM | POA: Diagnosis not present

## 2022-12-16 DIAGNOSIS — Z6836 Body mass index (BMI) 36.0-36.9, adult: Secondary | ICD-10-CM | POA: Diagnosis not present

## 2022-12-16 DIAGNOSIS — Z0000 Encounter for general adult medical examination without abnormal findings: Secondary | ICD-10-CM | POA: Diagnosis not present

## 2022-12-31 DIAGNOSIS — E119 Type 2 diabetes mellitus without complications: Secondary | ICD-10-CM | POA: Diagnosis not present

## 2022-12-31 DIAGNOSIS — H6121 Impacted cerumen, right ear: Secondary | ICD-10-CM | POA: Diagnosis not present

## 2022-12-31 DIAGNOSIS — I10 Essential (primary) hypertension: Secondary | ICD-10-CM | POA: Diagnosis not present

## 2022-12-31 DIAGNOSIS — E785 Hyperlipidemia, unspecified: Secondary | ICD-10-CM | POA: Diagnosis not present

## 2022-12-31 DIAGNOSIS — E1140 Type 2 diabetes mellitus with diabetic neuropathy, unspecified: Secondary | ICD-10-CM | POA: Diagnosis not present

## 2023-01-07 DIAGNOSIS — R21 Rash and other nonspecific skin eruption: Secondary | ICD-10-CM | POA: Diagnosis not present

## 2023-02-10 DIAGNOSIS — H18523 Epithelial (juvenile) corneal dystrophy, bilateral: Secondary | ICD-10-CM | POA: Diagnosis not present

## 2023-02-10 DIAGNOSIS — E119 Type 2 diabetes mellitus without complications: Secondary | ICD-10-CM | POA: Diagnosis not present

## 2023-02-13 ENCOUNTER — Ambulatory Visit (INDEPENDENT_AMBULATORY_CARE_PROVIDER_SITE_OTHER): Payer: Medicare Other | Admitting: Podiatry

## 2023-02-13 DIAGNOSIS — L84 Corns and callosities: Secondary | ICD-10-CM | POA: Diagnosis not present

## 2023-02-13 DIAGNOSIS — E1151 Type 2 diabetes mellitus with diabetic peripheral angiopathy without gangrene: Secondary | ICD-10-CM | POA: Diagnosis not present

## 2023-02-13 DIAGNOSIS — M79675 Pain in left toe(s): Secondary | ICD-10-CM | POA: Diagnosis not present

## 2023-02-13 DIAGNOSIS — M79674 Pain in right toe(s): Secondary | ICD-10-CM

## 2023-02-13 DIAGNOSIS — E1142 Type 2 diabetes mellitus with diabetic polyneuropathy: Secondary | ICD-10-CM

## 2023-02-13 DIAGNOSIS — B351 Tinea unguium: Secondary | ICD-10-CM

## 2023-02-13 NOTE — Progress Notes (Signed)
 Subjective:  Patient ID: Brittany Rojas, female    DOB: 27-Apr-1945,  MRN: [id number]  Brittany Rojas presents to clinic today for:  Chief Complaint  Patient presents with   [facility]    [facility] with a callous on right great. FBS 110 last A1c 7.1 ASA81   Patient notes nails are thick and elongated, causing pain in shoe gear when ambulating.  She also has painful calluses on the ball of the right foot.  She is currently wearing diabetic shoes but states that they are couple years old and not giving her much support anymore.  PCP is Aisha Harvey, MD. date last seen was around 12/26/22  Past Medical History:  Diagnosis Date   Bronchitis    touch q now and then (02/17/2012)   Depression    Diverticulitis    History of blood transfusion 1945   when I was 70 days old (02/17/2012)   History of transient ischemic attack (TIA) 2001   possibly; never really pinned down what it was (02/17/2012)   Hyperlipidemia    Hypertension    Migraines    outgrew them I guess (02/17/2012)   OSA on CPAP    Pneumonia 12/2010   Shortness of breath    occasionally; at any time (02/17/2012)   Type II diabetes mellitus (HCC)     Allergies  Allergen Reactions   Erythromycin Other (See Comments)    upset stomach (02/17/2012)   Penicillins Other (See Comments)    >50 yr ago, I had strept throat; had gotten lots and lots of PCN; last dose caused me to stop breathing (02/17/2012)   Tetracycline Other (See Comments)    upset stomach (02/17/2012)   Sulfonamide Derivatives Rash and Other (See Comments)    upset stomach (02/17/2012)   Guaifenesin Er Other (See Comments)   Morphine Nausea And Vomiting   Sulfa Antibiotics Other (See Comments)   Sulfur     Objective:  MAYBEL DAMBROSIO is a pleasant 78 y.o. female in NAD. AAO x 3.  Vascular Examination: Patient has palpable DP pulse, absent PT pulse bilateral.  Delayed capillary refill bilateral toes.  Sparse digital hair bilateral.  Proximal to distal cooling  WNL bilateral.    Dermatological Examination: Interspaces are clear with no open lesions noted bilateral.  Skin is shiny and atrophic bilateral.  Nails are 3-85mm thick, with yellowish/brown discoloration, subungual debris and distal onycholysis x10.  There is pain with compression of nails x10.  There are hyperkeratotic lesions noted right submet 1, right submet 5.  The right submet 1 callus is almost to the point of ulceration.  There is significant pressure on this area.  Patient qualifies for at-risk foot care because of diabetes with PVD.  Assessment/Plan: 1. Diabetic polyneuropathy associated with type 2 diabetes mellitus (HCC)   2. Pre-ulcerative calluses   3. Pain due to onychomycosis of toenails of both feet   4. Type II diabetes mellitus with peripheral circulatory disorder (HCC)     FOR HOME USE ONLY DME DIABETIC SHOE  Mycotic nails x10 were sharply debrided with sterile nail nippers and power debriding burr to decrease bulk and length.  Hyperkeratotic lesions x 2 were shaved with #312 blade.  An offloading felt pad was placed underneath her shoe insole to try to offload the first metatarsal head.    Order written for diabetic shoes with custom diabetic insoles to offload the first and possibly third metatarsal heads and decrease pre-ulcerative lesions.  Return  in about 3 months (around 05/14/2023) for [facility].   Awanda CHARM Imperial, DPM, FACFAS [facility]     [address] [place], [place] [address]                Office [phone]  Fax [phone]

## 2023-02-25 DIAGNOSIS — I2510 Atherosclerotic heart disease of native coronary artery without angina pectoris: Secondary | ICD-10-CM | POA: Diagnosis not present

## 2023-02-25 DIAGNOSIS — E1165 Type 2 diabetes mellitus with hyperglycemia: Secondary | ICD-10-CM | POA: Diagnosis not present

## 2023-02-25 DIAGNOSIS — I10 Essential (primary) hypertension: Secondary | ICD-10-CM | POA: Diagnosis not present

## 2023-02-25 DIAGNOSIS — E7800 Pure hypercholesterolemia, unspecified: Secondary | ICD-10-CM | POA: Diagnosis not present

## 2023-02-25 DIAGNOSIS — E1140 Type 2 diabetes mellitus with diabetic neuropathy, unspecified: Secondary | ICD-10-CM | POA: Diagnosis not present

## 2023-03-27 DIAGNOSIS — L570 Actinic keratosis: Secondary | ICD-10-CM | POA: Diagnosis not present

## 2023-03-27 DIAGNOSIS — D225 Melanocytic nevi of trunk: Secondary | ICD-10-CM | POA: Diagnosis not present

## 2023-03-27 DIAGNOSIS — L578 Other skin changes due to chronic exposure to nonionizing radiation: Secondary | ICD-10-CM | POA: Diagnosis not present

## 2023-03-27 DIAGNOSIS — L821 Other seborrheic keratosis: Secondary | ICD-10-CM | POA: Diagnosis not present

## 2023-03-27 DIAGNOSIS — C4441 Basal cell carcinoma of skin of scalp and neck: Secondary | ICD-10-CM | POA: Diagnosis not present

## 2023-04-08 ENCOUNTER — Ambulatory Visit: Payer: Medicare Other

## 2023-04-08 DIAGNOSIS — L84 Corns and callosities: Secondary | ICD-10-CM

## 2023-04-08 DIAGNOSIS — E1142 Type 2 diabetes mellitus with diabetic polyneuropathy: Secondary | ICD-10-CM

## 2023-04-08 DIAGNOSIS — M2041 Other hammer toe(s) (acquired), right foot: Secondary | ICD-10-CM

## 2023-04-08 DIAGNOSIS — M2010 Hallux valgus (acquired), unspecified foot: Secondary | ICD-10-CM

## 2023-04-08 DIAGNOSIS — M2141 Flat foot [pes planus] (acquired), right foot: Secondary | ICD-10-CM

## 2023-04-09 NOTE — Progress Notes (Signed)
 Patient presents to the office today for diabetic shoe and insole measuring.  Patient was measured with brannock device to determine size and width for 1 pair of extra depth shoes for 3 pair of insoles.   Documentation of medical necessity will be sent to patient's treating diabetic doctor to verify and sign.   Patient's diabetic provider: Dorisann Frames MD   Shoes and insoles will be ordered at that time and patient will be notified for an appointment for fitting when they arrive.   Shoe size (per patient): 8 Brannock measurement: 8 Shoe choice:   A330W / A600W Shoe size ordered:  Ppw and Abn SIGNED

## 2023-04-17 ENCOUNTER — Ambulatory Visit: Payer: Medicare Other | Admitting: Podiatry

## 2023-04-22 ENCOUNTER — Telehealth: Payer: Self-pay

## 2023-04-22 NOTE — Telephone Encounter (Signed)
 DM shoes in [place], ppwk expires 5/27

## 2023-04-28 ENCOUNTER — Ambulatory Visit (INDEPENDENT_AMBULATORY_CARE_PROVIDER_SITE_OTHER): Admitting: Podiatry

## 2023-04-28 DIAGNOSIS — E1142 Type 2 diabetes mellitus with diabetic polyneuropathy: Secondary | ICD-10-CM

## 2023-04-28 DIAGNOSIS — L97512 Non-pressure chronic ulcer of other part of right foot with fat layer exposed: Secondary | ICD-10-CM | POA: Diagnosis not present

## 2023-04-28 DIAGNOSIS — M79675 Pain in left toe(s): Secondary | ICD-10-CM

## 2023-04-28 DIAGNOSIS — B351 Tinea unguium: Secondary | ICD-10-CM

## 2023-04-28 DIAGNOSIS — L84 Corns and callosities: Secondary | ICD-10-CM | POA: Diagnosis not present

## 2023-04-28 DIAGNOSIS — M79674 Pain in right toe(s): Secondary | ICD-10-CM | POA: Diagnosis not present

## 2023-04-28 NOTE — Progress Notes (Unsigned)
 Chief Complaint  Patient presents with   [facility]    [facility] with callous care today. Callous are soaking. Last A1c was 7.2 about 5 months ago. Takes ASA 81    HPI: 78 y.o. female presenting today for diabetic footcare.  History confirmed with patient.  Her nails are thickened, elongated, dystrophic and she has difficulty maintaining them due to their dystrophic nature, as well as mobility issues along with being visually impaired.  She also has painful calluses under the right first and second metatarsal heads.  Last A1c 7.2.  Takes daily aspirin.  Past Medical History:  Diagnosis Date   Bronchitis    "touch q now and then" (02/17/2012)   Depression    Diverticulitis    History of blood transfusion 1945   "when I was 26 days old" (02/17/2012)   History of transient ischemic attack (TIA) 2001   "possibly; never really pinned down what it was" (02/17/2012)   Hyperlipidemia    Hypertension    Migraines    "outgrew them I guess" (02/17/2012)   OSA on CPAP    Pneumonia 12/2010   Shortness of breath    "occasionally; at any time" (02/17/2012)   Type II diabetes mellitus (HCC)     Past Surgical History:  Procedure Laterality Date   ABDOMINAL HYSTERECTOMY  2000's   APPENDECTOMY  04/1988   CARDIAC CATHETERIZATION  02/13/2012   COLECTOMY  01/18/1988   "took 3 feet out; result of lots of burst diverticula" (02/17/2012)   COLECTOMY  04/1988   "9 more inches of colon" (02/17/2012)   COLOSTOMY  01/18/1988   COLOSTOMY REVERSAL  04/1988   CORONARY ANGIOPLASTY WITH STENT PLACEMENT  02/17/2012   "1; first one ever" (02/17/2012)   LEFT OOPHORECTOMY  1989   PERCUTANEOUS CORONARY STENT INTERVENTION (PCI-S) N/A 02/17/2012   Procedure: PERCUTANEOUS CORONARY STENT INTERVENTION (PCI-S);  Surgeon: Lesleigh Noe, MD;  Location: [facility] CATH LAB;  Service: Cardiovascular;  Laterality: N/A;    Allergies  Allergen Reactions   Erythromycin Other (See Comments)    "upset stomach" (02/17/2012)   Penicillins Other (See  Comments)    ">50 yr ago, I had strept throat; had gotten lots and lots of PCN; last dose caused me to stop breathing" (02/17/2012)   Tetracycline Other (See Comments)    "upset stomach" (02/17/2012)   Sulfonamide Derivatives Rash and Other (See Comments)    "upset stomach" (02/17/2012)   Guaifenesin Er Other (See Comments)   Morphine Nausea And Vomiting   Sulfa Antibiotics Other (See Comments)   Sulfur     Smoker?: non-smoker  ROS denies any nausea, vomiting, fever, chills, chest pain, shortness of breath   PHYSICAL EXAM: There were no vitals filed for this visit.  General: The patient is alert and oriented x3 in no acute distress.  Dermatology: Pedal skin atrophic.  Nail plates x 10 bilaterally are thickened, elongated, dystrophic with subungual debris.  They are painful on direct dorsal palpation.  Interspaces are clear of maceration and debris.  Hemorrhagic callus present under the right first metatarsal head with underlying ulceration present.  There is also preulcerative callus subsecond metatarsal head.    Wound 1:  Location: Hemorrhagic callus subfirst metatarsal head right foot        Depth: Upon debridement there is subcutaneous tissue involvement        Wound Border: Hyperkeratotic, regular        Wound Base: Granular  Drainage: No       Odor?:  No        Surrounding Tissue: Periwound callus        Infected?:  No        Necrosis?:  No        Pain?:  Yes        Tunneling: No       Dimensions (cm): 1 x 1 x 0.3 cm postdebridement  Vascular: DP pulses palpable 2/4 bilaterally, PT pulses faintly palpable.  Capillary refill time is intact to the digits.  Diminished pedal hair growth.  No diffuse edema.  Neurological: Light touch sensation decreased.  Protective sensation decreased.  Musculoskeletal Exam: Bunion and hammertoe deformities bilaterally with prominent right first metatarsal head.      No data to display           ASSESSMENT / PLAN OF CARE: 1.  Diabetic polyneuropathy associated with type 2 diabetes mellitus (HCC)   2. Pre-ulcerative calluses   3. Pain due to onychomycosis of toenails of both feet   4. Ulcer of right foot with fat layer exposed (HCC)      No orders of the defined types were placed in this encounter.  None  The ulceration was sharply debrided of hyperkeratotic and devitalized soft tissue with sterile #312 blade to the level of subcutaneous tissue.  Hemostasis obtained.  Aquacel Ag and bordered foam dressing applied.  Reviewed off-loading with patient.  Surgical shoe dispensed for patient to wear at all times WB.   Reviewed daily dressing changes with patient.  Discussed risks / concerns regarding ulcer with patient and possible sequelae if left untreated.  Stressed importance of infection prevention at home. Short-term goals are: prevent infection, off-load ulcer, heal ulcer Long-term goals are:  prevent recurrence, prevent amputation.   Nail plates x 10 were debrided in thickness and length using sterile nail nippers without incident.  Mechanical bur used to file the nails down thin.  Symptomatic hyperkeratotic lesion right forefoot subsecond metatarsal head was safely debrided with a sterile #312 blade to patient's level of comfort without incident. We discussed preventative and palliative care of these lesions including supportive and accommodative shoegear, padding, prefabricated and custom molded accommodative orthoses, use of a pumice stone and lotions/creams daily.   Return in about 2 weeks (around 05/12/2023) for Wound Care.   Tycen Dockter L. Marchia Bond, AACFAS [facility]     [address] [place], [place] [address]                Office [phone]  Fax [phone]

## 2023-04-30 ENCOUNTER — Encounter: Payer: Self-pay | Admitting: Podiatry

## 2023-05-12 ENCOUNTER — Ambulatory Visit (INDEPENDENT_AMBULATORY_CARE_PROVIDER_SITE_OTHER): Admitting: Podiatry

## 2023-05-12 ENCOUNTER — Ambulatory Visit (INDEPENDENT_AMBULATORY_CARE_PROVIDER_SITE_OTHER)

## 2023-05-12 DIAGNOSIS — L97519 Non-pressure chronic ulcer of other part of right foot with unspecified severity: Secondary | ICD-10-CM

## 2023-05-12 DIAGNOSIS — E11621 Type 2 diabetes mellitus with foot ulcer: Secondary | ICD-10-CM

## 2023-05-12 DIAGNOSIS — E1142 Type 2 diabetes mellitus with diabetic polyneuropathy: Secondary | ICD-10-CM

## 2023-05-12 MED ORDER — MUPIROCIN 2 % EX OINT: 1.0000 | TOPICAL_OINTMENT | Freq: Two times a day (BID) | CUTANEOUS | 0 refills | Status: AC

## 2023-05-12 NOTE — Progress Notes (Unsigned)
 Chief Complaint  Patient presents with   Wound Check    Here today to check callous/ulcer on the right foot on the pad of the ball of her foot. Please also, recheck the nails on the left foot, she feels like they did not get cut short enough at the last visit. Last A1c was about seven months ago it was 7.2 takes ASA 81    HPI: 78 y.o. female presenting today for follow-up evaluation of right foot subfirst metatarsal head ulceration.  She is doing well.  Does state that the area can be tender.  Past Medical History:  Diagnosis Date   Bronchitis    "touch q now and then" (02/17/2012)   Depression    Diverticulitis    History of blood transfusion 1945   "when I was 67 days old" (02/17/2012)   History of transient ischemic attack (TIA) 2001   "possibly; never really pinned down what it was" (02/17/2012)   Hyperlipidemia    Hypertension    Migraines    "outgrew them I guess" (02/17/2012)   OSA on CPAP    Pneumonia 12/2010   Shortness of breath    "occasionally; at any time" (02/17/2012)   Type II diabetes mellitus (HCC)     Past Surgical History:  Procedure Laterality Date   ABDOMINAL HYSTERECTOMY  2000's   APPENDECTOMY  04/1988   CARDIAC CATHETERIZATION  02/13/2012   COLECTOMY  01/18/1988   "took 3 feet out; result of lots of burst diverticula" (02/17/2012)   COLECTOMY  04/1988   "9 more inches of colon" (02/17/2012)   COLOSTOMY  01/18/1988   COLOSTOMY REVERSAL  04/1988   CORONARY ANGIOPLASTY WITH STENT PLACEMENT  02/17/2012   "1; first one ever" (02/17/2012)   LEFT OOPHORECTOMY  1989   PERCUTANEOUS CORONARY STENT INTERVENTION (PCI-S) N/A 02/17/2012   Procedure: PERCUTANEOUS CORONARY STENT INTERVENTION (PCI-S);  Surgeon: Lesleigh Noe, MD;  Location: [facility] CATH LAB;  Service: Cardiovascular;  Laterality: N/A;    Allergies  Allergen Reactions   Erythromycin Other (See Comments)    "upset stomach" (02/17/2012)   Penicillins Other (See Comments)    ">50 yr ago, I had strept throat; had  gotten lots and lots of PCN; last dose caused me to stop breathing" (02/17/2012)   Tetracycline Other (See Comments)    "upset stomach" (02/17/2012)   Sulfonamide Derivatives Rash and Other (See Comments)    "upset stomach" (02/17/2012)   Guaifenesin Er Other (See Comments)   Morphine Nausea And Vomiting   Sulfa Antibiotics Other (See Comments)   Sulfur     Smoker?: non-smoker  ROS denies any nausea, vomiting, fever, chills, chest pain, shortness of breath   PHYSICAL EXAM: There were no vitals filed for this visit.  General: The patient is alert and oriented x3 in no acute distress.  Dermatology: Decreased hemorrhagic callus present under the right first metatarsal head with underlying ulceration present, limited to partial-thickness breakdown of skin at this point..  There is also preulcerative callus subsecond metatarsal head.    Wound 1:  Location: Hemorrhagic callus subfirst metatarsal head right foot        Depth: Upon debridement there is subcutaneous tissue involvement        Wound Border: Hyperkeratotic, regular        Wound Base: Bleeding base, partial thickness skin        Drainage: No       Odor?:  No  Surrounding Tissue: Periwound callus        Infected?:  No        Necrosis?:  No        Pain?:  Yes        Tunneling: No       Dimensions (cm): 0.5 x 0.5 x 0.2 cm postdebridement within dermal layer.  Vascular: DP pulses palpable 2/4 bilaterally, PT pulses faintly palpable.  Capillary refill time is intact to the digits.  Diminished pedal hair growth.  No diffuse edema.  Neurological: Light touch sensation decreased.  Protective sensation decreased.  Musculoskeletal Exam: Bunion and hammertoe deformities bilaterally with prominent right first metatarsal head.      No data to display         Radiographs: Right foot 3 views 05/12/2023 Diffuse osteopenia.  Evidence of prior surgical changes appears to have had prior wrist arthroplasty with first met head medial  eminence resection.  The joint is arthritic.  Changes to the second toe from prior PIPJ arthrodesis.  Second MPJ arthritic possible Freiberg's infarction.  No acute osseous destruction noted.   ASSESSMENT / PLAN OF CARE: 1. Ulcer of right foot due to type 2 diabetes mellitus (HCC)   2. Diabetic polyneuropathy associated with type 2 diabetes mellitus (HCC)      Meds ordered this encounter  Medications   mupirocin ointment (BACTROBAN) 2 %    Sig: Apply 1 Application topically 2 (two) times daily for 11 days.    Dispense:  22 g    Refill:  0   None Radiographs reviewed with patient  The ulceration was sharply debrided of hyperkeratotic and devitalized soft tissue with sterile #312 blade to the level of dermal tissue.  Hemostasis obtained.  Wound limited to partial-thickness skin breakdown at this point.  Felt offloading applied.  Reviewed daily dressing changes with patient.  Mupirocin sent to patient's pharmacy.  Discussed risks / concerns regarding ulcer with patient and possible sequelae if left untreated.  Stressed importance of infection prevention at home. Short-term goals are: prevent infection, off-load ulcer, heal ulcer Long-term goals are:  prevent recurrence, prevent amputation.   Return in about 3 weeks (around 06/02/2023) for Ulcer Check.   Dyshon Philbin L. Marchia Bond, AACFAS [facility]     [address] [place], [place] [address]                Office [phone]  Fax [phone]

## 2023-05-20 DIAGNOSIS — H18523 Epithelial (juvenile) corneal dystrophy, bilateral: Secondary | ICD-10-CM | POA: Diagnosis not present

## 2023-06-02 ENCOUNTER — Ambulatory Visit (INDEPENDENT_AMBULATORY_CARE_PROVIDER_SITE_OTHER): Admitting: Podiatry

## 2023-06-02 ENCOUNTER — Encounter: Payer: Self-pay | Admitting: Podiatry

## 2023-06-02 DIAGNOSIS — L97519 Non-pressure chronic ulcer of other part of right foot with unspecified severity: Secondary | ICD-10-CM | POA: Diagnosis not present

## 2023-06-02 DIAGNOSIS — E1142 Type 2 diabetes mellitus with diabetic polyneuropathy: Secondary | ICD-10-CM | POA: Diagnosis not present

## 2023-06-02 DIAGNOSIS — E11621 Type 2 diabetes mellitus with foot ulcer: Secondary | ICD-10-CM | POA: Diagnosis not present

## 2023-06-02 NOTE — Progress Notes (Signed)
 Chief Complaint  Patient presents with   Diabetic Ulcer    Right foot ulcer, calloused and not covered. Husband is in the hospital and she can not reach it to cover it.  Pain when stepping.     HPI: 78 y.o. female presenting today for follow-up evaluation of right foot subfirst metatarsal head ulceration.  She is doing well.  Does state that the area can be tender.  She feels that it is improving.  She does have trouble keeping it covered due to inability to reach her feet.  Unfortunately husband is in the hospital right now.  Past Medical History:  Diagnosis Date   Bronchitis    "touch q now and then" (02/17/2012)   Depression    Diverticulitis    History of blood transfusion 1945   "when I was 31 days old" (02/17/2012)   History of transient ischemic attack (TIA) 2001   "possibly; never really pinned down what it was" (02/17/2012)   Hyperlipidemia    Hypertension    Migraines    "outgrew them I guess" (02/17/2012)   OSA on CPAP    Pneumonia 12/2010   Shortness of breath    "occasionally; at any time" (02/17/2012)   Type II diabetes mellitus (HCC)     Past Surgical History:  Procedure Laterality Date   ABDOMINAL HYSTERECTOMY  2000's   APPENDECTOMY  04/1988   CARDIAC CATHETERIZATION  02/13/2012   COLECTOMY  01/18/1988   "took 3 feet out; result of lots of burst diverticula" (02/17/2012)   COLECTOMY  04/1988   "9 more inches of colon" (02/17/2012)   COLOSTOMY  01/18/1988   COLOSTOMY REVERSAL  04/1988   CORONARY ANGIOPLASTY WITH STENT PLACEMENT  02/17/2012   "1; first one ever" (02/17/2012)   LEFT OOPHORECTOMY  1989   PERCUTANEOUS CORONARY STENT INTERVENTION (PCI-S) N/A 02/17/2012   Procedure: PERCUTANEOUS CORONARY STENT INTERVENTION (PCI-S);  Surgeon: Mickiel Albany, MD;  Location: [facility] CATH LAB;  Service: Cardiovascular;  Laterality: N/A;    Allergies  Allergen Reactions   Erythromycin Other (See Comments)    "upset stomach" (02/17/2012)   Penicillins Other (See Comments)    ">50 yr  ago, I had strept throat; had gotten lots and lots of PCN; last dose caused me to stop breathing" (02/17/2012)   Tetracycline Other (See Comments)    "upset stomach" (02/17/2012)   Sulfonamide Derivatives Rash and Other (See Comments)    "upset stomach" (02/17/2012)   Guaifenesin Er Other (See Comments)   Morphine Nausea And Vomiting   Sulfa Antibiotics Other (See Comments)   Sulfur     Smoker?: non-smoker  ROS denies any nausea, vomiting, fever, chills, chest pain, shortness of breath   PHYSICAL EXAM: There were no vitals filed for this visit.  General: The patient is alert and oriented x3 in no acute distress.  Dermatology: Decreased hemorrhagic callus present under the right first metatarsal head with underlying ulceration present, there is partial-thickness breakdown of skin at this point without bleeding.  There is also preulcerative callus subsecond metatarsal head.  Vascular: DP pulses palpable 2/4 bilaterally, PT pulses faintly palpable.  Capillary refill time is intact to the digits.  Diminished pedal hair growth.  No diffuse edema.  Neurological: Light touch sensation decreased.  Protective sensation decreased.  Musculoskeletal Exam: Bunion and hammertoe deformities bilaterally with prominent right first metatarsal head.      No data to display         Radiographs: Right  foot 3 views 05/12/2023 Diffuse osteopenia.  Evidence of prior surgical changes appears to have had prior wrist arthroplasty with first met head medial eminence resection.  The joint is arthritic.  Changes to the second toe from prior PIPJ arthrodesis.  Second MPJ arthritic possible Freiberg's infarction.  No acute osseous destruction noted.   ASSESSMENT / PLAN OF CARE: 1. Ulcer of right foot due to type 2 diabetes mellitus (HCC)   2. Diabetic polyneuropathy associated with type 2 diabetes mellitus (HCC)      No orders of the defined types were placed in this encounter.  None Clinical findings  reviewed with patient  The hemorrhagic callus was sharply debrided of hyperkeratotic and devitalized soft tissue with sterile #312 blade without incident.  Underlying dermal layer of tissue intact.  Wound limited to partial-thickness skin breakdown at this point.  This appears to be nearly fully healed at this point.  Felt offloading applied.  Reviewed daily dressing changes with patient.  Patient to continue applying mupirocin .  Discussed risks / concerns regarding ulcer with patient and possible sequelae if left untreated.  Stressed importance of infection prevention at home. Short-term goals are: prevent infection, off-load ulcer, heal ulcer Long-term goals are:  prevent recurrence, prevent amputation.   Return in about 5 weeks (around 07/07/2023) for Diabetic Foot Care.   Whit Bruni L. Lunda Salines, AACFAS [facility]     [address] [place], [place] [address]                Office [phone]  Fax [phone]

## 2023-06-24 DIAGNOSIS — H6122 Impacted cerumen, left ear: Secondary | ICD-10-CM | POA: Diagnosis not present

## 2023-07-08 ENCOUNTER — Ambulatory Visit (INDEPENDENT_AMBULATORY_CARE_PROVIDER_SITE_OTHER): Admitting: Podiatry

## 2023-07-08 ENCOUNTER — Encounter: Payer: Self-pay | Admitting: Podiatry

## 2023-07-08 DIAGNOSIS — L84 Corns and callosities: Secondary | ICD-10-CM

## 2023-07-08 DIAGNOSIS — E1142 Type 2 diabetes mellitus with diabetic polyneuropathy: Secondary | ICD-10-CM

## 2023-07-08 DIAGNOSIS — M79674 Pain in right toe(s): Secondary | ICD-10-CM | POA: Diagnosis not present

## 2023-07-08 DIAGNOSIS — B351 Tinea unguium: Secondary | ICD-10-CM

## 2023-07-08 DIAGNOSIS — M79675 Pain in left toe(s): Secondary | ICD-10-CM

## 2023-07-08 DIAGNOSIS — L97512 Non-pressure chronic ulcer of other part of right foot with fat layer exposed: Secondary | ICD-10-CM | POA: Diagnosis not present

## 2023-07-08 MED ORDER — MUPIROCIN 2 % EX OINT: 1.0000 | TOPICAL_OINTMENT | Freq: Two times a day (BID) | CUTANEOUS | 2 refills | Status: DC

## 2023-07-08 NOTE — Patient Instructions (Addendum)
 More silicone and felt pads can be purchased from:  [email or web address]   Look for felt dancers pads on [employer] or from [place]

## 2023-07-08 NOTE — Progress Notes (Unsigned)
Chief Complaint  Patient presents with   [facility]    [facility] with callous and the one callous has a dark center in it. Last A1c is unknown. FBS today was 210.  Takes ASA     HPI: 78 y.o. female presenting today for diabetic footcare and follow-up evaluation of right foot subfirst metatarsal head ulceration.  Patient has difficulty maintaining her nails due to thickness, length, dystrophic nature.  They are painful with pressure and with shoe gear.  Patient states that she was on her feet quite a bit lately and that the area of ulceration has callused over but is painful.  She did recently get new diabetic shoes, states that she has not been wearing them as been using older pair.  She states she has difficulty reaching her feet and checking them.  Past Medical History:  Diagnosis Date   Bronchitis    "touch q now and then" (02/17/2012)   Depression    Diverticulitis    History of blood transfusion 1945   "when I was 48 days old" (02/17/2012)   History of transient ischemic attack (TIA) 2001   "possibly; never really pinned down what it was" (02/17/2012)   Hyperlipidemia    Hypertension    Migraines    "outgrew them I guess" (02/17/2012)   OSA on CPAP    Pneumonia 12/2010   Shortness of breath    "occasionally; at any time" (02/17/2012)   Type II diabetes mellitus (HCC)     Past Surgical History:  Procedure Laterality Date   ABDOMINAL HYSTERECTOMY  2000's   APPENDECTOMY  04/1988   CARDIAC CATHETERIZATION  02/13/2012   COLECTOMY  01/18/1988   "took 3 feet out; result of lots of burst diverticula" (02/17/2012)   COLECTOMY  04/1988   "9 more inches of colon" (02/17/2012)   COLOSTOMY  01/18/1988   COLOSTOMY REVERSAL  04/1988   CORONARY ANGIOPLASTY WITH STENT PLACEMENT  02/17/2012   "1; first one ever" (02/17/2012)   LEFT OOPHORECTOMY  1989   PERCUTANEOUS CORONARY STENT INTERVENTION (PCI-S) N/A 02/17/2012   Procedure: PERCUTANEOUS CORONARY STENT INTERVENTION (PCI-S);  Surgeon: Mickiel Albany, MD;   Location: [facility] CATH LAB;  Service: Cardiovascular;  Laterality: N/A;    Allergies  Allergen Reactions   Erythromycin Other (See Comments)    "upset stomach" (02/17/2012)   Penicillins Other (See Comments)    ">50 yr ago, I had strept throat; had gotten lots and lots of PCN; last dose caused me to stop breathing" (02/17/2012)   Tetracycline Other (See Comments)    "upset stomach" (02/17/2012)   Sulfonamide Derivatives Rash and Other (See Comments)    "upset stomach" (02/17/2012)   Guaifenesin Er Other (See Comments)   Morphine Nausea And Vomiting   Sulfa Antibiotics Other (See Comments)   Sulfur     ROS denies any nausea, vomiting, fever, chills, chest pain, shortness of breath   PHYSICAL EXAM: There were no vitals filed for this visit.  General: The patient is alert and oriented x3 in no acute distress.  Dermatology: Area of ulceration right foot subfirst metatarsal head with overlying hemorrhagic callus.  The ulceration does involve fat layer following debridement.  No surrounding erythema, no drainage, no malodor.  Approximately 1 cm in diameter towards the wound bed with significant hyperkeratotic margin.  There is also preulcerative callus present subsecond metatarsal head right foot.  Nail plates x 10 are thickened, elongated, dystrophic with subungual debris.  They are painful on  direct dorsal palpation.  Vascular: DP pulses palpable 2/4 bilaterally, PT pulses faintly palpable.  Capillary refill time is intact to the digits.  Diminished pedal hair growth.  No diffuse edema.  Neurological: Light touch sensation decreased.  Protective sensation decreased.  Musculoskeletal Exam: Bunion and hammertoe deformities bilaterally with prominent right first metatarsal head.      No data to display         Radiographs: Right foot 3 views 05/12/2023 Diffuse osteopenia.  Evidence of prior surgical changes appears to have had prior wrist arthroplasty with first met head medial eminence  resection.  The joint is arthritic.  Changes to the second toe from prior PIPJ arthrodesis.  Second MPJ arthritic possible Freiberg's infarction.  No acute osseous destruction noted.   ASSESSMENT / PLAN OF CARE: 1. Diabetic polyneuropathy associated with type 2 diabetes mellitus (HCC)   2. Ulcer of right foot with fat layer exposed (HCC)   3. Pain due to onychomycosis of toenails of both feet   4. Callus of foot      Meds ordered this encounter  Medications   mupirocin  ointment (BACTROBAN ) 2 %    Sig: Apply 1 Application topically 2 (two) times daily.    Dispense:  30 g    Refill:  2   None Clinical findings reviewed with patient  # Right subfirst metatarsal head ulceration with fat layer exposed Medically necessary debridement of the ulceration of devitalized and hyperkeratotic  tissue with sterile #312 blade down to subcutaneous layer with bleeding base.  Regression of the wound noted.  Mupirocin  bandage applied.  Offloading felt padding applied  Reviewed daily dressing changes and offloading with patient.  Patient to continue applying mupirocin .  Discussed risks / concerns regarding ulcer with patient and possible sequelae if left untreated.  Stressed importance of infection prevention at home. Short-term goals are: prevent infection, off-load ulcer, heal ulcer Long-term goals are:  prevent recurrence, prevent amputation.   # Right subsecond metatarsal head callus -Symptomatic preulcerative callus was safely debrided with a sterile #312 blade to patient's level of comfort without incident. We discussed preventative and palliative care of these lesions including supportive and accommodative shoegear, padding, prefabricated and custom molded accommodative orthoses, use of a pumice stone and lotions/creams daily - Encouraged patient to try their diabetic shoes to see if this limits progression of the callus and of the subfirst met head ulceration or to determine if any modifications  to the shoes need to be made.  # Pain due to onychomycosis of toenails - Nail plates x 10 were debrided in thickness and length using sterile nail nippers without incident. - Mechanical bur used to file nails  Patient educated on diabetes. Discussed proper diabetic foot care and discussed risks and complications of disease. Educated patient in depth on reasons to return to the office immediately should he/she discover anything concerning or new on the feet. All questions answered. Discussed proper shoes as well.     Return in about 2 weeks (around 07/22/2023) for Wound Care.   Maddalyn Lutze L. Lunda Salines, AACFAS [facility]     [address], [place] [address]                Office [phone]  Fax (  [phone]    

## 2023-07-22 ENCOUNTER — Ambulatory Visit (INDEPENDENT_AMBULATORY_CARE_PROVIDER_SITE_OTHER): Admitting: Podiatry

## 2023-07-22 ENCOUNTER — Encounter: Payer: Self-pay | Admitting: Podiatry

## 2023-07-22 DIAGNOSIS — L97511 Non-pressure chronic ulcer of other part of right foot limited to breakdown of skin: Secondary | ICD-10-CM

## 2023-07-22 DIAGNOSIS — E1142 Type 2 diabetes mellitus with diabetic polyneuropathy: Secondary | ICD-10-CM | POA: Diagnosis not present

## 2023-07-22 NOTE — Progress Notes (Signed)
 Chief Complaint  Patient presents with   Diabetic Ulcer    Wound care for the right foot. There is a tiny spot in the center of the callous that is sore. She is soaking. She also thinks that the black on the right nail is coming from her shoes. A1c not checked since dec. Goes in July for recheck. Takes ASA    HPI: 78 y.o. female presenting today for follow-up evaluation of right foot subfirst metatarsal head ulceration.  She has been using her diabetic shoes.  She believes that this has been helpful for the area of ulceration right subfirst metatarsal head.  It has been coming along better.  She does not notice much pain there at this point.  Past Medical History:  Diagnosis Date   Bronchitis    touch q now and then (02/17/2012)   Depression    Diverticulitis    History of blood transfusion 1945   when I was 36 days old (02/17/2012)   History of transient ischemic attack (TIA) 2001   possibly; never really pinned down what it was (02/17/2012)   Hyperlipidemia    Hypertension    Migraines    outgrew them I guess (02/17/2012)   OSA on CPAP    Pneumonia 12/2010   Shortness of breath    occasionally; at any time (02/17/2012)   Type II diabetes mellitus (HCC)     Past Surgical History:  Procedure Laterality Date   ABDOMINAL HYSTERECTOMY  2000's   APPENDECTOMY  04/1988   CARDIAC CATHETERIZATION  02/13/2012   COLECTOMY  01/18/1988   took 3 feet out; result of lots of burst diverticula (02/17/2012)   COLECTOMY  04/1988   9 more inches of colon (02/17/2012)   COLOSTOMY  01/18/1988   COLOSTOMY REVERSAL  04/1988   CORONARY ANGIOPLASTY WITH STENT PLACEMENT  02/17/2012   1; first one ever (02/17/2012)   LEFT OOPHORECTOMY  1989   PERCUTANEOUS CORONARY STENT INTERVENTION (PCI-S) N/A 02/17/2012   Procedure: PERCUTANEOUS CORONARY STENT INTERVENTION (PCI-S);  Surgeon: Mickiel Albany, MD;  Location: [facility] CATH LAB;  Service: Cardiovascular;  Laterality: N/A;    Allergies  Allergen  Reactions   Erythromycin Other (See Comments)    upset stomach (02/17/2012)   Penicillins Other (See Comments)    >50 yr ago, I had strept throat; had gotten lots and lots of PCN; last dose caused me to stop breathing (02/17/2012)   Tetracycline Other (See Comments)    upset stomach (02/17/2012)   Sulfonamide Derivatives Rash and Other (See Comments)    upset stomach (02/17/2012)   Guaifenesin Er Other (See Comments)   Morphine Nausea And Vomiting   Sulfa Antibiotics Other (See Comments)   Sulfur     ROS denies any nausea, vomiting, fever, chills, chest pain, shortness of breath   PHYSICAL EXAM: There were no vitals filed for this visit.  General: The patient is alert and oriented x3 in no acute distress.  Dermatology: Hemorrhagic callus right subfirst metatarsal head.  Upon debridement there is breakdown of the skin with exposed dermis about 0.5 cm in diameter.  Starting to develop corn to the right fifth toe  Vascular: DP pulses palpable 2/4 bilaterally, PT pulses faintly palpable.  Capillary refill time is intact to the digits.  Diminished pedal hair growth.  No diffuse edema.  Neurological: Light touch sensation decreased.  Protective sensation decreased.  Musculoskeletal Exam: Bunion and hammertoe deformities bilaterally with prominent right first metatarsal head.  No data to display           ASSESSMENT / PLAN OF CARE: 1. Diabetic polyneuropathy associated with type 2 diabetes mellitus (HCC)   2. Ulcer of right foot limited to breakdown of skin (HCC)      No orders of the defined types were placed in this encounter.  None Clinical findings reviewed with patient  # Right subfirst metatarsal head ulceration with fat layer exposed Medically necessary debridement of the ulceration of devitalized and hyperkeratotic  tissue with sterile #312 blade down to dermal tissue layer with bleeding base.  Wound progression noted.  Mupirocin  and bandage applied.   Offloading felt padding applied  Reviewed daily dressing changes and offloading with patient.  Patient to continue applying mupirocin .  Discussed risks / concerns regarding ulcer with patient and possible sequelae if left untreated.  Stressed importance of infection prevention at home. Short-term goals are: prevent infection, off-load ulcer, heal ulcer Long-term goals are:  prevent recurrence, prevent amputation.     Return in about 3 weeks (around 08/12/2023) for Ulcer Check.   Leidi Astle L. Lunda Salines, AACFAS [facility]     [address] [place], [place] [address]                Office [phone]  Fax [phone]     Pinpoint opening sub 1st partial thickness. Some sesamoid pain Some soreness to 5th toe, at risk of developing corn 0.4 cm in diameter

## 2023-08-12 ENCOUNTER — Ambulatory Visit (INDEPENDENT_AMBULATORY_CARE_PROVIDER_SITE_OTHER): Admitting: Podiatry

## 2023-08-12 DIAGNOSIS — L97519 Non-pressure chronic ulcer of other part of right foot with unspecified severity: Secondary | ICD-10-CM | POA: Diagnosis not present

## 2023-08-12 DIAGNOSIS — M216X1 Other acquired deformities of right foot: Secondary | ICD-10-CM | POA: Diagnosis not present

## 2023-08-12 DIAGNOSIS — E11621 Type 2 diabetes mellitus with foot ulcer: Secondary | ICD-10-CM | POA: Diagnosis not present

## 2023-08-12 NOTE — Progress Notes (Signed)
 Chief Complaint  Patient presents with   Diabetic Ulcer    Right ulcer check. Callous is built up and painful.    HPI: 78 y.o. female presenting today for follow-up evaluation of right foot subfirst metatarsal head ulceration.  She has been using her diabetic shoes and her old pair.  She endorses pain to the site.  It is callused over at this point.  Past Medical History:  Diagnosis Date   Bronchitis    touch q now and then (02/17/2012)   Depression    Diverticulitis    History of blood transfusion 1945   when I was 58 days old (02/17/2012)   History of transient ischemic attack (TIA) 2001   possibly; never really pinned down what it was (02/17/2012)   Hyperlipidemia    Hypertension    Migraines    outgrew them I guess (02/17/2012)   OSA on CPAP    Pneumonia 12/2010   Shortness of breath    occasionally; at any time (02/17/2012)   Type II diabetes mellitus (HCC)     Past Surgical History:  Procedure Laterality Date   ABDOMINAL HYSTERECTOMY  2000's   APPENDECTOMY  04/1988   CARDIAC CATHETERIZATION  02/13/2012   COLECTOMY  01/18/1988   took 3 feet out; result of lots of burst diverticula (02/17/2012)   COLECTOMY  04/1988   9 more inches of colon (02/17/2012)   COLOSTOMY  01/18/1988   COLOSTOMY REVERSAL  04/1988   CORONARY ANGIOPLASTY WITH STENT PLACEMENT  02/17/2012   1; first one ever (02/17/2012)   LEFT OOPHORECTOMY  1989   PERCUTANEOUS CORONARY STENT INTERVENTION (PCI-S) N/A 02/17/2012   Procedure: PERCUTANEOUS CORONARY STENT INTERVENTION (PCI-S);  Surgeon: Victory LELON Claudene DOUGLAS, MD;  Location: [facility] CATH LAB;  Service: Cardiovascular;  Laterality: N/A;    Allergies  Allergen Reactions   Erythromycin Other (See Comments)    upset stomach (02/17/2012)   Penicillins Other (See Comments)    >50 yr ago, I had strept throat; had gotten lots and lots of PCN; last dose caused me to stop breathing (02/17/2012)   Tetracycline Other (See Comments)    upset stomach (02/17/2012)    Sulfonamide Derivatives Rash and Other (See Comments)    upset stomach (02/17/2012)   Guaifenesin Er Other (See Comments)   Morphine Nausea And Vomiting   Sulfa Antibiotics Other (See Comments)   Sulfur     ROS denies any nausea, vomiting, fever, chills, chest pain, shortness of breath   PHYSICAL EXAM: There were no vitals filed for this visit.  General: The patient is alert and oriented x3 in no acute distress.  Dermatology: There is chronic recurrent hemorrhagic callus right subfirst metatarsal head.  This is accompanied by partial-thickness skin breakdown approximately 0.5 cm in diameter.  Dermal tissue exposed.  Vascular: DP pulses palpable 2/4 bilaterally, PT pulses faintly palpable.  Capillary refill time is intact to the digits.  Diminished pedal hair growth.  No diffuse edema.  Neurological: Light touch sensation decreased.  Protective sensation decreased.  Musculoskeletal Exam: Bunion and hammertoe deformities bilaterally with prominent right first metatarsal head.      No data to display           ASSESSMENT / PLAN OF CARE: 1. Ulcer of right foot due to type 2 diabetes mellitus (HCC)   2. Plantar flexed metatarsal bone of right foot      No orders of the defined types were placed in this encounter.  FOR HOME USE ONLY DME DIABETIC SHOE Clinical findings reviewed with patient  # Right subfirst metatarsal head ulceration with dermal tissue exposed Medically necessary debridement of the ulceration of devitalized and hyperkeratotic  tissue with sterile #312 blade down to dermal tissue layer with bleeding base.  Wound progression noted.  Mupirocin  and bandage applied.  Offloading felt padding applied  -Emphasized importance of regular offloading. - She feels that the new diabetic shoes may not be offloading the area adequately.  Reviewed daily dressing changes and offloading with patient.  Patient to continue applying mupirocin .  Discussed risks / concerns  regarding ulcer with patient and possible sequelae if left untreated.  Stressed importance of infection prevention at home. Short-term goals are: prevent infection, off-load ulcer, heal ulcer Long-term goals are:  prevent recurrence, prevent amputation.   DFC at follow-up as well.    Return in about 3 weeks (around 09/02/2023) for Diabetic Foot Care/ulcer check.   Shalaya Swailes L. Lamount MAUL, AACFAS [facility]     [address] [place], [place] [address]                Office [phone]  Fax [phone]

## 2023-08-15 ENCOUNTER — Encounter: Payer: Self-pay | Admitting: Podiatry

## 2023-08-25 DIAGNOSIS — E1140 Type 2 diabetes mellitus with diabetic neuropathy, unspecified: Secondary | ICD-10-CM | POA: Diagnosis not present

## 2023-08-25 DIAGNOSIS — E1165 Type 2 diabetes mellitus with hyperglycemia: Secondary | ICD-10-CM | POA: Diagnosis not present

## 2023-09-01 DIAGNOSIS — E7800 Pure hypercholesterolemia, unspecified: Secondary | ICD-10-CM | POA: Diagnosis not present

## 2023-09-01 DIAGNOSIS — I2510 Atherosclerotic heart disease of native coronary artery without angina pectoris: Secondary | ICD-10-CM | POA: Diagnosis not present

## 2023-09-01 DIAGNOSIS — I10 Essential (primary) hypertension: Secondary | ICD-10-CM | POA: Diagnosis not present

## 2023-09-01 DIAGNOSIS — E1165 Type 2 diabetes mellitus with hyperglycemia: Secondary | ICD-10-CM | POA: Diagnosis not present

## 2023-09-01 DIAGNOSIS — E1140 Type 2 diabetes mellitus with diabetic neuropathy, unspecified: Secondary | ICD-10-CM | POA: Diagnosis not present

## 2023-09-02 DIAGNOSIS — G4733 Obstructive sleep apnea (adult) (pediatric): Secondary | ICD-10-CM | POA: Diagnosis not present

## 2023-09-02 DIAGNOSIS — E1165 Type 2 diabetes mellitus with hyperglycemia: Secondary | ICD-10-CM | POA: Diagnosis not present

## 2023-09-09 ENCOUNTER — Ambulatory Visit (INDEPENDENT_AMBULATORY_CARE_PROVIDER_SITE_OTHER): Admitting: Podiatry

## 2023-09-09 ENCOUNTER — Encounter: Payer: Self-pay | Admitting: Podiatry

## 2023-09-09 ENCOUNTER — Ambulatory Visit

## 2023-09-09 DIAGNOSIS — M79674 Pain in right toe(s): Secondary | ICD-10-CM

## 2023-09-09 DIAGNOSIS — L84 Corns and callosities: Secondary | ICD-10-CM

## 2023-09-09 DIAGNOSIS — M79675 Pain in left toe(s): Secondary | ICD-10-CM

## 2023-09-09 DIAGNOSIS — B351 Tinea unguium: Secondary | ICD-10-CM | POA: Diagnosis not present

## 2023-09-09 DIAGNOSIS — E1142 Type 2 diabetes mellitus with diabetic polyneuropathy: Secondary | ICD-10-CM

## 2023-09-09 NOTE — Progress Notes (Unsigned)
  Subjective:  Patient ID: Brittany Rojas, female    DOB: 1946/01/16,  MRN: [id number]  Chief Complaint  Patient presents with   [facility]     [facility] with callous. Last A1c 6.5 in July, ASA    78 y.o. female presents with the above complaint. History confirmed with patient. Patient presenting with pain related to dystrophic thickened elongated nails. Patient is unable to trim own nails related to nail dystrophy, mobility issues and impaired vision. Patient does have a history of T2DM. Patient does have callus present located at the right subfirst and subsecond metatarsal head, right fifth toe dorsal lateral PIPJ causing pain.   Objective:  Physical Exam: warm, good capillary refill, decreased pedal hair growth.  Pedal skin atrophic. nail exam onychomycosis of the toenails, onycholysis, and dystrophic nails DP pulses palpable, PT pulses palpable, and protective sensation absent, light touch sensation decreased to toes Left Foot:  Pain with palpation of nails due to elongation and dystrophic growth.  Right Foot: Pain with palpation of nails due to elongation and dystrophic growth.  Preulcerative callus present right subfirst and subsecond metatarsal heads, dorsal lateral right fifth toe PIPJ.  Site of prior ulceration right subfirst metatarsal head does appear healed today.  Assessment:   1. Diabetic polyneuropathy associated with type 2 diabetes mellitus (HCC)   2. Callus of foot   3. Pain due to onychomycosis of toenails of both feet      Plan:  Patient was evaluated and treated and all questions answered.  #Hyperkeratotic lesions/pre ulcerative calluses present right subfirst, subsecond metatarsal heads and dorsal lateral fifth toe PIPJ All symptomatic hyperkeratoses x 3 separate lesions were safely debrided with a sterile #312 blade to patient's level of comfort without incident. We discussed preventative and palliative care of these lesions including supportive and accommodative shoegear,  padding, prefabricated and custom molded accommodative orthoses, use of a pumice stone and lotions/creams daily.  #Onychomycosis with pain  -Nails palliatively debrided as below. -Educated on self-care  Procedure: Nail Debridement Rationale: Pain Type of Debridement: manual, sharp debridement. Instrumentation: Nail nipper, rotary burr. Number of Nails: 10  Patient educated on diabetes. Discussed proper diabetic foot care and discussed risks and complications of disease. Educated patient in depth on reasons to return to the office immediately should he/she discover anything concerning or new on the feet. All questions answered. Discussed proper shoes as well.  - Did recently have her diabetic shoes adjusted and stretched to decrease friction to the right lateral forefoot - Will have patient follow-up in 9 weeks due to recently healed ulceration and to monitor for signs of recurrence  Of note does mention that she has been dealing with some left ankle pain.  Did discuss general RICE protocol and use of supportive shoes, over-the-counter brace.  Can follow-up as needed for this if this worsens, may obtain radiographs at follow-up.  Return in about 9 weeks (around 11/11/2023) for Diabetic Foot Care.         Ethan Saddler, DPM [facility] / [facility]

## 2023-09-11 NOTE — Progress Notes (Signed)
 Shoes were spot stretched to relieve pressure from right tailor bunion  Brittany Rojas Cped

## 2023-10-07 ENCOUNTER — Ambulatory Visit: Admitting: Podiatry

## 2023-10-28 DIAGNOSIS — M7541 Impingement syndrome of right shoulder: Secondary | ICD-10-CM | POA: Diagnosis not present

## 2023-11-06 ENCOUNTER — Encounter: Payer: Self-pay | Admitting: Cardiology

## 2023-11-06 ENCOUNTER — Ambulatory Visit: Attending: Cardiology | Admitting: Cardiology

## 2023-11-06 VITALS — BP 130/60 | HR 61 | Ht 62.5 in | Wt 176.1 lb

## 2023-11-06 DIAGNOSIS — Z8679 Personal history of other diseases of the circulatory system: Secondary | ICD-10-CM | POA: Insufficient documentation

## 2023-11-06 DIAGNOSIS — E7800 Pure hypercholesterolemia, unspecified: Secondary | ICD-10-CM | POA: Diagnosis not present

## 2023-11-06 DIAGNOSIS — E119 Type 2 diabetes mellitus without complications: Secondary | ICD-10-CM | POA: Insufficient documentation

## 2023-11-06 DIAGNOSIS — I2510 Atherosclerotic heart disease of native coronary artery without angina pectoris: Secondary | ICD-10-CM | POA: Insufficient documentation

## 2023-11-06 DIAGNOSIS — I10 Essential (primary) hypertension: Secondary | ICD-10-CM | POA: Insufficient documentation

## 2023-11-06 LAB — LIPID PANEL
Chol/HDL Ratio: 2.2 ratio (ref 0.0–4.4)
Cholesterol, Total: 138 mg/dL (ref 100–199)
HDL: 64 mg/dL (ref >39)
LDL Chol Calc (NIH): 54 mg/dL (ref 0–99)
Triglycerides: 113 mg/dL (ref 0–149)
VLDL Cholesterol Cal: 20 mg/dL (ref 5–40)

## 2023-11-06 NOTE — Progress Notes (Signed)
 Cardiology Office Note:  .   Date:  11/06/2023  ID:  Brittany Rojas, DOB 1945/12/05, MRN [id number] PCP: Fritz Greig DEL PA-C  [facility] Providers Cardiologist:  Oneil Parchment, MD     History of Present Illness: .   Brittany Rojas is a 78 y.o. female Discussed the use of AI scribe software   History of Present Illness Brittany Rojas is a 78 year old female with coronary artery disease who presents for follow-up.  She has a history of coronary artery disease, with an obtuse marginal drug-eluting stent placed in 2014. A nuclear stress test in 2021 showed low risk with no ischemia and a normal ejection fraction. She denies any chest pain.  She experiences dizziness, particularly when standing up, which she attributes to her age. She uses a cane for balance, although her granddaughter has expressed concerns about its safety.  Her hypertension is managed with losartan  hydrochlorothiazide  100/25 mg daily and amlodipine 5 mg daily.  Obstructive sleep apnea is managed with CPAP therapy, which she uses regularly without issues.  She has hyperlipidemia, for which she takes atorvastatin  80 mg daily. Her LDL cholesterol was 71 in 7976, but she is unsure of more recent values.  Her diabetes is managed by endocrinology and is generally stable. She takes insulin  and monitors her blood sugar levels, although she had a recent episode where her blood sugar reached 400, possibly due to prednisone use. Her last A1c was 6.7.  She reports having bursitis in her right shoulder, which was treated with an injection last Tuesday, and she has been taking Tylenol  for pain relief. She notes significant improvement in her symptoms.  She recently received her latest COVID-19 vaccination.  She mentions a new family doctor, Amy, at [facility] in [place], who assisted with clearing out her left ear, which had been causing discomfort due to blockage.      Studies Reviewed: SABRA   EKG  Interpretation Date/Time:  Thursday November 06 2023 09:19:58 EDT Ventricular Rate:  61 PR Interval:  132 QRS Duration:  132 QT Interval:  420 QTC Calculation: 422 R Axis:   9  Text Interpretation: Normal sinus rhythm Right bundle branch block Inferior infarct , age undetermined When compared with ECG of 17-Feb-2012 10:47, Right bundle branch block is now Present Inferior infarct is now Present Confirmed by Parchment Oneil ([id number]) on 11/06/2023 9:29:12 AM    Results LABS LDL cholesterol: 71 (2023) A1c: 6.7 (08/2023) Creatinine: 0.87 (08/2023)  DIAGNOSTIC Nuclear stress test: Low risk with no ischemia, normal ejection fraction (2021) Electrocardiogram (EKG): Normal Risk Assessment/Calculations:            Physical Exam:   VS:  BP 130/60   Pulse 61   Ht 5' 2.5 (1.588 m)   Wt 176 lb 1.6 oz (79.9 kg)   SpO2 97%   BMI 31.70 kg/m    Wt Readings from Last 3 Encounters:  11/06/23 176 lb 1.6 oz (79.9 kg)  05/23/22 192 lb (87.1 kg)  01/08/21 200 lb 12.8 oz (91.1 kg)    GEN: Well nourished, well developed in no acute distress NECK: No JVD; No carotid bruits CARDIAC: RRR, no murmurs, no rubs, no gallops RESPIRATORY:  Clear to auscultation without rales, wheezing or rhonchi  ABDOMEN: Soft, non-tender, non-distended EXTREMITIES:  No edema; No deformity   ASSESSMENT AND PLAN: .    Assessment and Plan Assessment & Plan Coronary artery disease, status post drug-eluting stent placement Coronary artery disease with obtuse marginal  drug-eluting stent placed in 2014. No recent chest pain reported. Nuclear stress test in 2021 showed low risk with no ischemia and normal ejection fraction. Continues to do well with current management. - Continue aspirin  81 mg daily, atorvastatin  80 mg daily.  Essential hypertension Hypertension is well-controlled with current medication regimen. Blood pressure readings are good. - Continue losartan -hydrochlorothiazide  100/25 mg daily, amlodipine 5 mg  daily.  Mixed hyperlipidemia LDL cholesterol was 71 mg/dL in 7976. Current management with atorvastatin  is effective. - Order lipid panel to assess current cholesterol levels. - Continue atorvastatin  80 mg daily.  Diabetes management Diabetes is managed by endocrinology and is overall well-managed. She expressed concerns about being placed on an insulin  pump due to age and current management satisfaction. Prefers to manage diabetes with current regimen and dietary adjustments.          Dispo: 1 yr  Signed, Oneil Parchment, MD

## 2023-11-06 NOTE — Patient Instructions (Signed)
 Medication Instructions:  Your physician recommends that you continue on your current medications as directed. Please refer to the Current Medication list given to you today.  *If you need a refill on your cardiac medications before your next appointment, please call your pharmacy*  Lab Work: TODAY: Lipid panel If you have labs (blood work) drawn today and your tests are completely normal, you will receive your results only by: MyChart Message (if you have MyChart) OR A paper copy in the mail If you have any lab test that is abnormal or we need to change your treatment, we will call you to review the results.  Follow-Up: At [facility], you and your health needs are our priority.  As part of our continuing mission to provide you with exceptional heart care, our providers are all part of one team.  This team includes your primary Cardiologist (physician) and Advanced Practice Providers or APPs (Physician Assistants and Nurse Practitioners) who all work together to provide you with the care you need, when you need it.  Your next appointment:   1 year(s)  Provider:   Oneil Parchment, MD  We recommend signing up for the patient portal called MyChart.  Sign up information is provided on this After Visit Summary.  MyChart is used to connect with patients for Virtual Visits (Telemedicine).  Patients are able to view lab/test results, encounter notes, upcoming appointments, etc.  Non-urgent messages can be sent to your provider as well.    To learn more about what you can do with MyChart, go to [email or web address].

## 2023-11-07 ENCOUNTER — Ambulatory Visit: Payer: Self-pay | Admitting: Cardiology

## 2023-11-11 ENCOUNTER — Ambulatory Visit (INDEPENDENT_AMBULATORY_CARE_PROVIDER_SITE_OTHER): Admitting: Podiatry

## 2023-11-11 ENCOUNTER — Encounter: Payer: Self-pay | Admitting: Podiatry

## 2023-11-11 DIAGNOSIS — L84 Corns and callosities: Secondary | ICD-10-CM

## 2023-11-11 DIAGNOSIS — M79674 Pain in right toe(s): Secondary | ICD-10-CM

## 2023-11-11 DIAGNOSIS — E1142 Type 2 diabetes mellitus with diabetic polyneuropathy: Secondary | ICD-10-CM

## 2023-11-11 DIAGNOSIS — B351 Tinea unguium: Secondary | ICD-10-CM

## 2023-11-11 DIAGNOSIS — M79675 Pain in left toe(s): Secondary | ICD-10-CM

## 2023-11-11 NOTE — Progress Notes (Unsigned)
  Subjective:  Patient ID: Brittany Rojas, female    DOB: 1945/08/22,  MRN: [id number]  Chief Complaint  Patient presents with   [facility]    [facility] with callous A1c was 6.5 (goes every 6 months) ASA    78 y.o. female presents with the above complaint. History confirmed with patient. Patient presenting with pain related to dystrophic thickened elongated nails. Patient is unable to trim own nails related to nail dystrophy and mobility issues.  Also history of vision problems patient does have a history of T2DM.  Does have history of ulceration right subfirst metatarsal head which does appear callused over today.  Also dealing with painful calluses subsecond metatarsal head and right fifth toe interdigital aspect PIPJ.  Objective:  Physical Exam: warm, good capillary refill, decreased pedal hair growth, pedal skin atrophic nail exam onychomycosis of the toenails, onycholysis, dystrophic nails, and greater than 3 mm thickening DP pulses palpable, PT pulses palpable, protective sensation absent, and vibratory sensation diminished Left Foot:  Pain with palpation of nails due to elongation and dystrophic growth.  Right Foot: Pain with palpation of nails due to elongation and dystrophic growth.  Preulcerative callus present subfirst and subsecond metatarsal head right foot, interdigital fifth toe PIPJ Hammertoe contractures of lesser digits noted.  Assessment:  No diagnosis found.   Plan:  Patient was evaluated and treated and all questions answered.  #Hyperkeratotic lesions/pre ulcerative calluses present right subfirst, subsecond and interdigital aspect of fifth toe PIPJ right foot All symptomatic hyperkeratoses x 3 separate lesions were safely debrided with a sterile #312 blade to patient's level of comfort without incident. We discussed preventative and palliative care of these lesions including supportive and accommodative shoegear, padding, prefabricated and custom molded accommodative orthoses, use  of a pumice stone and lotions/creams daily.  #Onychomycosis with pain  -Nails palliatively debrided as below. -Educated on self-care  Procedure: Nail Debridement Rationale: Pain Type of Debridement: manual, sharp debridement. Instrumentation: Nail nipper, rotary burr. Number of Nails: 10  Patient educated on diabetes. Discussed proper diabetic foot care and discussed risks and complications of disease. Educated patient in depth on reasons to return to the office immediately should he/she discover anything concerning or new on the feet. All questions answered. Discussed proper shoes as well.   Will maintain 9-week follow-up due to history of ulceration.   Return in about 9 weeks (around 01/13/2024) for Diabetic Foot Care.         Ethan Saddler, DPM [facility] / [facility]

## 2023-12-10 DIAGNOSIS — Z23 Encounter for immunization: Secondary | ICD-10-CM | POA: Diagnosis not present

## 2023-12-15 DIAGNOSIS — Z6831 Body mass index (BMI) 31.0-31.9, adult: Secondary | ICD-10-CM | POA: Diagnosis not present

## 2023-12-15 DIAGNOSIS — H6992 Unspecified Eustachian tube disorder, left ear: Secondary | ICD-10-CM | POA: Diagnosis not present

## 2023-12-15 DIAGNOSIS — F430 Acute stress reaction: Secondary | ICD-10-CM | POA: Diagnosis not present

## 2024-01-11 DIAGNOSIS — D649 Anemia, unspecified: Secondary | ICD-10-CM | POA: Diagnosis present

## 2024-01-11 DIAGNOSIS — K8020 Calculus of gallbladder without cholecystitis without obstruction: Secondary | ICD-10-CM | POA: Diagnosis not present

## 2024-01-11 DIAGNOSIS — Z882 Allergy status to sulfonamides status: Secondary | ICD-10-CM | POA: Diagnosis not present

## 2024-01-11 DIAGNOSIS — K430 Incisional hernia with obstruction, without gangrene: Secondary | ICD-10-CM | POA: Diagnosis present

## 2024-01-11 DIAGNOSIS — Z7982 Long term (current) use of aspirin: Secondary | ICD-10-CM | POA: Diagnosis not present

## 2024-01-11 DIAGNOSIS — E1142 Type 2 diabetes mellitus with diabetic polyneuropathy: Secondary | ICD-10-CM | POA: Diagnosis present

## 2024-01-11 DIAGNOSIS — J45909 Unspecified asthma, uncomplicated: Secondary | ICD-10-CM | POA: Diagnosis present

## 2024-01-11 DIAGNOSIS — Z9049 Acquired absence of other specified parts of digestive tract: Secondary | ICD-10-CM | POA: Diagnosis not present

## 2024-01-11 DIAGNOSIS — Z87891 Personal history of nicotine dependence: Secondary | ICD-10-CM | POA: Diagnosis not present

## 2024-01-11 DIAGNOSIS — Z880 Allergy status to penicillin: Secondary | ICD-10-CM | POA: Diagnosis not present

## 2024-01-11 DIAGNOSIS — I10 Essential (primary) hypertension: Secondary | ICD-10-CM | POA: Diagnosis present

## 2024-01-11 DIAGNOSIS — R1013 Epigastric pain: Secondary | ICD-10-CM | POA: Diagnosis not present

## 2024-01-11 DIAGNOSIS — I252 Old myocardial infarction: Secondary | ICD-10-CM | POA: Diagnosis not present

## 2024-01-11 DIAGNOSIS — Z955 Presence of coronary angioplasty implant and graft: Secondary | ICD-10-CM | POA: Diagnosis not present

## 2024-01-11 DIAGNOSIS — Z881 Allergy status to other antibiotic agents status: Secondary | ICD-10-CM | POA: Diagnosis not present

## 2024-01-11 DIAGNOSIS — L89159 Pressure ulcer of sacral region, unspecified stage: Secondary | ICD-10-CM | POA: Diagnosis present

## 2024-01-11 DIAGNOSIS — Z794 Long term (current) use of insulin: Secondary | ICD-10-CM | POA: Diagnosis not present

## 2024-01-11 DIAGNOSIS — K56600 Partial intestinal obstruction, unspecified as to cause: Secondary | ICD-10-CM | POA: Diagnosis not present

## 2024-01-11 DIAGNOSIS — R1010 Upper abdominal pain, unspecified: Secondary | ICD-10-CM | POA: Diagnosis not present

## 2024-01-11 DIAGNOSIS — Z79899 Other long term (current) drug therapy: Secondary | ICD-10-CM | POA: Diagnosis not present

## 2024-01-11 DIAGNOSIS — I2510 Atherosclerotic heart disease of native coronary artery without angina pectoris: Secondary | ICD-10-CM | POA: Diagnosis present

## 2024-01-11 DIAGNOSIS — R112 Nausea with vomiting, unspecified: Secondary | ICD-10-CM | POA: Diagnosis not present

## 2024-01-11 DIAGNOSIS — M1990 Unspecified osteoarthritis, unspecified site: Secondary | ICD-10-CM | POA: Diagnosis present

## 2024-01-13 ENCOUNTER — Ambulatory Visit: Admitting: Podiatry

## 2024-01-13 ENCOUNTER — Other Ambulatory Visit

## 2024-01-15 ENCOUNTER — Telehealth: Payer: Self-pay

## 2024-01-15 DIAGNOSIS — D649 Anemia, unspecified: Secondary | ICD-10-CM | POA: Diagnosis present

## 2024-01-15 DIAGNOSIS — Z794 Long term (current) use of insulin: Secondary | ICD-10-CM | POA: Diagnosis not present

## 2024-01-15 DIAGNOSIS — R7401 Elevation of levels of liver transaminase levels: Secondary | ICD-10-CM | POA: Diagnosis present

## 2024-01-15 DIAGNOSIS — J45909 Unspecified asthma, uncomplicated: Secondary | ICD-10-CM | POA: Diagnosis present

## 2024-01-15 DIAGNOSIS — Z880 Allergy status to penicillin: Secondary | ICD-10-CM | POA: Diagnosis not present

## 2024-01-15 DIAGNOSIS — Z888 Allergy status to other drugs, medicaments and biological substances status: Secondary | ICD-10-CM | POA: Diagnosis not present

## 2024-01-15 DIAGNOSIS — E669 Obesity, unspecified: Secondary | ICD-10-CM | POA: Diagnosis present

## 2024-01-15 DIAGNOSIS — Z955 Presence of coronary angioplasty implant and graft: Secondary | ICD-10-CM | POA: Diagnosis not present

## 2024-01-15 DIAGNOSIS — R1084 Generalized abdominal pain: Secondary | ICD-10-CM | POA: Diagnosis not present

## 2024-01-15 DIAGNOSIS — K8012 Calculus of gallbladder with acute and chronic cholecystitis without obstruction: Secondary | ICD-10-CM | POA: Diagnosis present

## 2024-01-15 DIAGNOSIS — K56600 Partial intestinal obstruction, unspecified as to cause: Secondary | ICD-10-CM | POA: Diagnosis not present

## 2024-01-15 DIAGNOSIS — I10 Essential (primary) hypertension: Secondary | ICD-10-CM | POA: Diagnosis present

## 2024-01-15 DIAGNOSIS — I959 Hypotension, unspecified: Secondary | ICD-10-CM | POA: Diagnosis not present

## 2024-01-15 DIAGNOSIS — Z7982 Long term (current) use of aspirin: Secondary | ICD-10-CM | POA: Diagnosis not present

## 2024-01-15 DIAGNOSIS — R112 Nausea with vomiting, unspecified: Secondary | ICD-10-CM | POA: Diagnosis not present

## 2024-01-15 DIAGNOSIS — E785 Hyperlipidemia, unspecified: Secondary | ICD-10-CM | POA: Diagnosis present

## 2024-01-15 DIAGNOSIS — Z8719 Personal history of other diseases of the digestive system: Secondary | ICD-10-CM | POA: Diagnosis not present

## 2024-01-15 DIAGNOSIS — Z79899 Other long term (current) drug therapy: Secondary | ICD-10-CM | POA: Diagnosis not present

## 2024-01-15 DIAGNOSIS — Z882 Allergy status to sulfonamides status: Secondary | ICD-10-CM | POA: Diagnosis not present

## 2024-01-15 DIAGNOSIS — M1990 Unspecified osteoarthritis, unspecified site: Secondary | ICD-10-CM | POA: Diagnosis present

## 2024-01-15 DIAGNOSIS — Z881 Allergy status to other antibiotic agents status: Secondary | ICD-10-CM | POA: Diagnosis not present

## 2024-01-15 DIAGNOSIS — R54 Age-related physical debility: Secondary | ICD-10-CM | POA: Diagnosis present

## 2024-01-15 DIAGNOSIS — E119 Type 2 diabetes mellitus without complications: Secondary | ICD-10-CM | POA: Diagnosis present

## 2024-01-15 DIAGNOSIS — Z6831 Body mass index (BMI) 31.0-31.9, adult: Secondary | ICD-10-CM | POA: Diagnosis not present

## 2024-01-15 DIAGNOSIS — I252 Old myocardial infarction: Secondary | ICD-10-CM | POA: Diagnosis not present

## 2024-01-15 DIAGNOSIS — R1111 Vomiting without nausea: Secondary | ICD-10-CM | POA: Diagnosis not present

## 2024-01-15 DIAGNOSIS — Z87891 Personal history of nicotine dependence: Secondary | ICD-10-CM | POA: Diagnosis not present

## 2024-01-15 NOTE — Transitions of Care (Post Inpatient/ED Visit) (Signed)
   01/15/2024  Name: Brittany Rojas MRN: [id number] DOB: 1945/05/28  Today's TOC FU Call Status: Today's TOC FU Call Status:: Unsuccessful Call (1st Attempt) Unsuccessful Call (1st Attempt) Date: 01/15/24  Attempted to reach the patient regarding the most recent Inpatient/ED visit.  Follow Up Plan: Additional outreach attempts will be made to reach the patient to complete the Transitions of Care (Post Inpatient/ED visit) call.   Alan Ee, RN, BSN, CEN [employer]- Transition of Care Team.  [facility] [phone]

## 2024-01-16 ENCOUNTER — Telehealth: Payer: Self-pay

## 2024-01-16 NOTE — Transitions of Care (Post Inpatient/ED Visit) (Signed)
   01/16/2024  Name: Brittany Rojas MRN: [id number] DOB: 25-Jun-1945  Today's TOC FU Call Status: Today's TOC FU Call Status:: Unsuccessful Call (2nd Attempt) Unsuccessful Call (2nd Attempt) Date: 01/16/24  Attempted to reach the patient regarding the most recent Inpatient/ED visit.  Follow Up Plan: Additional outreach attempts will be made to reach the patient to complete the Transitions of Care (Post Inpatient/ED visit) call.   Shona Prow RN, CCM [place]  VBCI-Population Health RN Care Manager [phone]

## 2024-01-18 DIAGNOSIS — I2510 Atherosclerotic heart disease of native coronary artery without angina pectoris: Secondary | ICD-10-CM | POA: Diagnosis present

## 2024-01-18 DIAGNOSIS — D649 Anemia, unspecified: Secondary | ICD-10-CM | POA: Diagnosis present

## 2024-01-18 DIAGNOSIS — I10 Essential (primary) hypertension: Secondary | ICD-10-CM | POA: Diagnosis present

## 2024-01-18 DIAGNOSIS — E119 Type 2 diabetes mellitus without complications: Secondary | ICD-10-CM | POA: Diagnosis present

## 2024-01-18 DIAGNOSIS — A0811 Acute gastroenteropathy due to Norwalk agent: Secondary | ICD-10-CM | POA: Diagnosis present

## 2024-01-18 DIAGNOSIS — Z6831 Body mass index (BMI) 31.0-31.9, adult: Secondary | ICD-10-CM | POA: Diagnosis not present

## 2024-01-18 DIAGNOSIS — Z955 Presence of coronary angioplasty implant and graft: Secondary | ICD-10-CM | POA: Diagnosis not present

## 2024-01-18 DIAGNOSIS — Z882 Allergy status to sulfonamides status: Secondary | ICD-10-CM | POA: Diagnosis not present

## 2024-01-18 DIAGNOSIS — Z880 Allergy status to penicillin: Secondary | ICD-10-CM | POA: Diagnosis not present

## 2024-01-18 DIAGNOSIS — E785 Hyperlipidemia, unspecified: Secondary | ICD-10-CM | POA: Diagnosis present

## 2024-01-18 DIAGNOSIS — J45909 Unspecified asthma, uncomplicated: Secondary | ICD-10-CM | POA: Diagnosis present

## 2024-01-18 DIAGNOSIS — Z7982 Long term (current) use of aspirin: Secondary | ICD-10-CM | POA: Diagnosis not present

## 2024-01-18 DIAGNOSIS — Z87891 Personal history of nicotine dependence: Secondary | ICD-10-CM | POA: Diagnosis not present

## 2024-01-18 DIAGNOSIS — I252 Old myocardial infarction: Secondary | ICD-10-CM | POA: Diagnosis not present

## 2024-01-18 DIAGNOSIS — Z794 Long term (current) use of insulin: Secondary | ICD-10-CM | POA: Diagnosis not present

## 2024-01-18 DIAGNOSIS — Z881 Allergy status to other antibiotic agents status: Secondary | ICD-10-CM | POA: Diagnosis not present

## 2024-01-18 DIAGNOSIS — E669 Obesity, unspecified: Secondary | ICD-10-CM | POA: Diagnosis present

## 2024-01-18 DIAGNOSIS — J90 Pleural effusion, not elsewhere classified: Secondary | ICD-10-CM | POA: Diagnosis present

## 2024-01-18 DIAGNOSIS — M1990 Unspecified osteoarthritis, unspecified site: Secondary | ICD-10-CM | POA: Diagnosis present

## 2024-01-18 DIAGNOSIS — R112 Nausea with vomiting, unspecified: Secondary | ICD-10-CM | POA: Diagnosis not present

## 2024-01-18 DIAGNOSIS — Z79899 Other long term (current) drug therapy: Secondary | ICD-10-CM | POA: Diagnosis not present

## 2024-01-18 DIAGNOSIS — Z9049 Acquired absence of other specified parts of digestive tract: Secondary | ICD-10-CM | POA: Diagnosis not present

## 2024-01-18 DIAGNOSIS — R7401 Elevation of levels of liver transaminase levels: Secondary | ICD-10-CM | POA: Diagnosis present

## 2024-01-18 DIAGNOSIS — Z888 Allergy status to other drugs, medicaments and biological substances status: Secondary | ICD-10-CM | POA: Diagnosis not present

## 2024-01-19 ENCOUNTER — Telehealth: Payer: Self-pay

## 2024-01-22 ENCOUNTER — Telehealth: Payer: Self-pay

## 2024-01-22 NOTE — Transitions of Care (Post Inpatient/ED Visit) (Signed)
 01/22/2024  Name: Brittany Rojas MRN: [id number] DOB: Jul 03, 1945  Today's TOC FU Call Status: Today's TOC FU Call Status:: Successful TOC FU Call Completed TOC FU Call Complete Date: 01/22/24  Patient's Name and Date of Birth confirmed. DOB, Name  Transition Care Management Follow-up Telephone Call Date of Discharge: 01/21/24 Discharge Facility: Other ([facility]) Name of Other ([facility]) Discharge Facility: [facility] Type of Discharge: Inpatient Admission Primary Inpatient Discharge Diagnosis:: norovirus gastroenteritis - profuse diarrhea with intractable nausea and vomiting found to have elevated LFTs 2ndary to recent cholecystectomy How have you been since you were released from the hospital?: Better Any questions or concerns?: No  Items Reviewed: Did you receive and understand the discharge instructions provided?: Yes Medications obtained,verified, and reconciled?: Yes (Medications Reviewed) Any new allergies since your discharge?: No Dietary orders reviewed?: Yes Type of Diet Ordered:: patient states she understands all foods and states she was given a sample meal plan Do you have support at home?: Yes People in Home [RPT]: spouse Name of Support/Comfort Primary Source: husband helps as needed  Medications Reviewed Today: Medications Reviewed Today     Reviewed by Lauro Shona LABOR, RN (Registered Nurse) on 01/22/24 at 1159  Med List Status: <None>   Medication Order Taking? Sig Documenting Provider Last Dose Status Informant  albuterol  (VENTOLIN  HFA) 108 (90 Base) MCG/ACT inhaler [id number]  2 puffs as needed  Patient not taking: Reported on 01/22/2024   [provider]  Consider Medication Status and Discontinue (Completed Course)   amLODipine (NORVASC) 5 MG tablet [id number] Yes Take 5 mg by mouth daily. [provider]  Active   aspirin  81 MG tablet [id number] Yes Take 81 mg by mouth daily. [provider]  Active    atorvastatin  (LIPITOR) 80 MG tablet [id number] Yes Take 80 mg by mouth daily.  [provider]  Active   Continuous Blood Gluc Receiver (FREESTYLE LIBRE 2 READER) [place] [id number] Yes See admin instructions. [provider]  Active   Continuous Blood Gluc Sensor (FREESTYLE LIBRE 14 DAY SENSOR) [place] [id number] Yes 1 each by Does not apply route every 14 (fourteen) days. Kassie Mallick, MD  Active   insulin  lispro (HUMALOG ) 100 UNIT/ML injection [id number]  5u-10u  Patient not taking: Reported on 01/22/2024   [provider]  Consider Medication Status and Discontinue (Completed Course)   insulin  NPH Human (HUMULIN N,NOVOLIN N) 100 UNIT/ML injection [id number] Yes Inject 10 units into the skin each morning and 65 units into the skin each evening.  Patient taking differently: 6 Units as directed. Patient reports she takes Novolin N  20 units in the morning & 6 units at bedtime   [provider]  Active   insulin  regular (NOVOLIN R,HUMULIN R ) 100 units/mL injection [id number] Yes Use as directed per scale three (3) times daily.  Patient taking differently: Use as directed per scale 5-20 units three (3) times daily.   [provider]  Active   LACTOBACILLUS PO [id number] Yes Take 1 tablet by mouth daily. [provider]  Active   losartan -hydrochlorothiazide  (HYZAAR) 100-25 MG tablet [id number] Yes Take 1 tablet by mouth daily. [provider]  Active   Multiple Vitamins-Minerals (MULTI FOR HER 50+) TABS [id number] Yes See admin instructions. [provider]  Active   nitroGLYCERIN  (NITROSTAT ) 0.4 MG SL tablet [id number]  Place 1 tablet (0.4 mg total) under the tongue every 5 (five) minutes as needed for chest pain.  Patient not taking: Reported on 01/22/2024   Claudene Shove  W, MD  Active     Discontinued 04/25/11 0817 (Error)             Home Care and Equipment/Supplies: Were Home Health Services Ordered?: No Any new equipment or  medical supplies ordered?: No  Functional Questionnaire: Do you need assistance with bathing/showering or dressing?: Yes (husband can help if I need it) Do you need assistance with meal preparation?: Yes (patient states she usually prepares meals but husband helps if needed) Do you need assistance with eating?: No Do you have difficulty maintaining continence: No Do you need assistance with getting out of bed/getting out of a chair/moving?: No Do you have difficulty managing or taking your medications?: No  Follow up appointments reviewed: PCP Follow-up appointment confirmed?: Yes Date of PCP follow-up appointment?: 01/28/24 Follow-up Provider: PCP, [facility] Follow-up appointment confirmed?: Yes Date of Specialist follow-up appointment?: 01/27/24 Follow-Up Specialty Provider:: Podiatrist, Dr Ethan Saddler 01/27/24 Do you need transportation to your follow-up appointment?: No Do you understand care options if your condition(s) worsen?: Yes-patient verbalized understanding  SDOH Interventions Today    Flowsheet Row Most Recent Value  SDOH Interventions   Food Insecurity Interventions Intervention Not Indicated  Housing Interventions Intervention Not Indicated  Transportation Interventions Intervention Not Indicated  Utilities Interventions Intervention Not Indicated   Patient declined TOC enrollment- unable to complete all assessment questions Shona Prow RN, CCM [facility]  VBCI-Population Health RN Care Manager [phone]

## 2024-01-27 ENCOUNTER — Other Ambulatory Visit

## 2024-01-27 ENCOUNTER — Encounter: Payer: Self-pay | Admitting: Podiatry

## 2024-01-27 ENCOUNTER — Ambulatory Visit: Admitting: Podiatry

## 2024-01-27 DIAGNOSIS — B351 Tinea unguium: Secondary | ICD-10-CM | POA: Diagnosis not present

## 2024-01-27 DIAGNOSIS — M79674 Pain in right toe(s): Secondary | ICD-10-CM | POA: Diagnosis not present

## 2024-01-27 DIAGNOSIS — L97512 Non-pressure chronic ulcer of other part of right foot with fat layer exposed: Secondary | ICD-10-CM | POA: Diagnosis not present

## 2024-01-27 DIAGNOSIS — L84 Corns and callosities: Secondary | ICD-10-CM

## 2024-01-27 DIAGNOSIS — E1142 Type 2 diabetes mellitus with diabetic polyneuropathy: Secondary | ICD-10-CM

## 2024-01-27 DIAGNOSIS — M79675 Pain in left toe(s): Secondary | ICD-10-CM

## 2024-01-27 MED ORDER — MUPIROCIN 2 % EX OINT: 1.0000 | TOPICAL_OINTMENT | Freq: Two times a day (BID) | CUTANEOUS | 2 refills | Status: DC

## 2024-01-27 NOTE — Progress Notes (Unsigned)
 Chief Complaint  Patient presents with   [name]    [facility] with callous A1c 6.5 six months  ASA    HPI: 78 y.o. female presenting today for diabetic footcare.  History confirmed with patient.  Comes in with painful, thickened, elongated, dystrophic toenails that she is unable to maintain herself due to their dystrophic appearance and due to mobility issues.  There is also concern for recurrence of right subfirst metatarsal head ulcer as there appears to be dried blood underneath callus associated with this.  Last A1c 6.5.  Also dealing with preulcerative calluses right subsecond metatarsal head, medial aspect of first toe, right fifth toe PIPJ corn.  Past Medical History:  Diagnosis Date   Bronchitis    touch q now and then (02/17/2012)   Depression    Diverticulitis    History of blood transfusion 1945   when I was 42 days old (02/17/2012)   History of transient ischemic attack (TIA) 2001   possibly; never really pinned down what it was (02/17/2012)   Hyperlipidemia    Hypertension    Migraines    outgrew them I guess (02/17/2012)   OSA on CPAP    Pneumonia 12/2010   Shortness of breath    occasionally; at any time (02/17/2012)   Type II diabetes mellitus (HCC)    Past Surgical History:  Procedure Laterality Date   ABDOMINAL HYSTERECTOMY  2000's   APPENDECTOMY  04/1988   CARDIAC CATHETERIZATION  02/13/2012   COLECTOMY  01/18/1988   took 3 feet out; result of lots of burst diverticula (02/17/2012)   COLECTOMY  04/1988   9 more inches of colon (02/17/2012)   COLOSTOMY  01/18/1988   COLOSTOMY REVERSAL  04/1988   CORONARY ANGIOPLASTY WITH STENT PLACEMENT  02/17/2012   1; first one ever (02/17/2012)   LEFT OOPHORECTOMY  1989   PERCUTANEOUS CORONARY STENT INTERVENTION (PCI-S) N/A 02/17/2012   Procedure: PERCUTANEOUS CORONARY STENT INTERVENTION (PCI-S);  Surgeon: Brittany LELON Brittany DOUGLAS, MD;  Location: [facility] CATH LAB;  Service: Cardiovascular;  Laterality: N/A;   Allergies[1]    PHYSICAL  EXAM: There were no vitals filed for this visit.  General: The patient is alert and oriented x3 in no acute distress.  Dermatology: Skin is warm, dry and supple bilateral lower extremities. Interspaces are clear of maceration and debris.  Nailplates x 10 are thickened greater than 3 mm, dystrophic, elongated with yellow discoloration and subungual debris.  Mycotic in appearance.  The affected nail plates x 10 are tender on direct dorsal palpation.  Recurrent right subfirst metatarsal calluses described below.  Preulcerative calluses right subsecond metatarsal head, bilateral first toe interphalangeal joint and right fifth toe PIPJ corn.    Wound 1:  Location: Right subfirst metatarsal head        Depth: Subcutaneous tissue        Wound Border: Hyperkeratotic        Wound Base: Fibrogranular        Drainage: Scant       Odor?:  None        Surrounding Tissue: Hemorrhagic callus        Infected?:  No        Necrosis?:  Loss of skin and subcutaneous tissue        Pain?:  No        Tunneling: No       Dimensions (cm): Predebridement measurements limited by overlying hyperkeratotic tissue.  Postdebridement measurements 0.6 x 0.5 x 0.3 cm.  Vascular: DP and PT pulses palpable.  Cap refill intact to the digits.  Decreased pedal hair growth.  No significant edema.  Neurological: Light touch sensation decreased to toes.  Protective sensation absent.  Musculoskeletal Exam: Prominent first metatarsal head.  Hammertoe contractures of lesser digits.      No data to display            ASSESSMENT / PLAN OF CARE: 1. Diabetic polyneuropathy associated with type 2 diabetes mellitus (HCC)   2. Callus of foot   3. Pain due to onychomycosis of toenails of both feet   4. Ulcer of right foot with fat layer exposed (HCC)      Meds ordered this encounter  Medications   mupirocin  ointment (BACTROBAN ) 2 %    Sig: Apply 1 Application topically 2 (two) times daily.    Dispense:  30 g    Refill:   2   None   Right subfirst metatarsal head ulceration with fat exposed The ulceration was sharply debrided of hyperkeratotic and devitalized soft tissue with sterile #312 blade to the level of subcutaneous tissue.  Hemostasis obtained.  Antibiotic ointment and DSD applied.  Reviewed off-loading with patient.  Postdebridement measurement 0.6 x 0.5 x 0.3 cm.  1 to 2 cc of blood loss noted.  Offloading reviewed with patient.  Dancers pad applied.  Reviewed daily dressing changes with patient.  Discussed risks / concerns regarding ulcer with patient and possible sequelae if left untreated.  Stressed importance of infection prevention at home. Short-term goals are: prevent infection, off-load ulcer, heal ulcer Long-term goals are:  prevent recurrence, prevent amputation.   #Onychomycosis with pain  -Nails palliatively debrided as below. -Educated on self-care - At risk footcare due to diabetes with neuropathy  Procedure: Nail Debridement Rationale: Pain Type of Debridement: manual, sharp debridement. Instrumentation: Nail nipper, rotary burr. Number of Nails: 10  Other remaining calluses were sharply debrided without incident.   Return in about 2 weeks (around 02/10/2024) for Wound Care.   Alvis Edgell L. Lamount MAUL, AACFAS [facility]     [address], [place] [address]                Office [phone]  Fax [phone]       [1]  Allergies Allergen Reactions   Erythromycin Other (See Comments)    upset stomach (02/17/2012)   Penicillins Other (See Comments)    >50 yr ago, I had strept throat; had gotten lots and lots of PCN; last dose caused me to stop breathing (02/17/2012)   Tetracycline Other (See Comments)    upset stomach (02/17/2012)   Sulfonamide Derivatives Rash and Other (See Comments)    upset stomach (02/17/2012)   Gabapentin      Per pt I broke out in a rash   Guaifenesin Er Other (See  Comments)   Morphine Nausea And Vomiting   Sulfa Antibiotics Other (See Comments)   Sulfur

## 2024-01-27 NOTE — Patient Instructions (Signed)
 Instructions for Wound Care  The most important step to healing a foot wound is to reduce the pressure on your foot - it is extremely important to stay off your foot as much as possible and wear the shoe/boot as instructed.  Cleanse your foot with saline wash or warm soapy water (dial antibacterial soap or similar).  Blot dry.  Apply prescribed medication to your wound and cover with gauze and a bandage.  May hold bandage in place with Coban (self sticky wrap), Ace bandage or tape.  You may find dressing supplies at your local [employer], [employer], drug store or medical supply store.  Monitor for any signs/symptoms of infection. If there is any increase in redness, red streaks, increase in drainage, warmth to your foot please give us  a call. Also, if you start to run a fever or have flu-like symptoms that can also be a sign of infection. Call the office immediately if any occur or go directly to the emergency room.   If you have any questions, please feel free to give us  a call at [phone] or if you are on MyChart you can always send me a message if needed.   Offload around the ulcer using felt dancer's pads from [employer]

## 2024-02-10 ENCOUNTER — Ambulatory Visit: Admitting: Podiatry

## 2024-02-10 ENCOUNTER — Encounter: Payer: Self-pay | Admitting: Podiatry

## 2024-02-10 DIAGNOSIS — M216X1 Other acquired deformities of right foot: Secondary | ICD-10-CM

## 2024-02-10 DIAGNOSIS — L97512 Non-pressure chronic ulcer of other part of right foot with fat layer exposed: Secondary | ICD-10-CM | POA: Diagnosis not present

## 2024-02-10 DIAGNOSIS — E1142 Type 2 diabetes mellitus with diabetic polyneuropathy: Secondary | ICD-10-CM | POA: Diagnosis not present

## 2024-02-10 NOTE — Progress Notes (Unsigned)
 Fat layer exposed, lots of hemorrhagic callus switch to betadine Medial first toe preulcerative

## 2024-03-02 ENCOUNTER — Ambulatory Visit (INDEPENDENT_AMBULATORY_CARE_PROVIDER_SITE_OTHER): Admitting: Podiatry

## 2024-03-02 ENCOUNTER — Encounter: Payer: Self-pay | Admitting: Podiatry

## 2024-03-02 DIAGNOSIS — L97512 Non-pressure chronic ulcer of other part of right foot with fat layer exposed: Secondary | ICD-10-CM

## 2024-03-02 DIAGNOSIS — E1151 Type 2 diabetes mellitus with diabetic peripheral angiopathy without gangrene: Secondary | ICD-10-CM

## 2024-03-02 MED ORDER — MUPIROCIN 2 % EX OINT: 1.0000 | TOPICAL_OINTMENT | Freq: Two times a day (BID) | CUTANEOUS | 2 refills | Status: AC

## 2024-03-02 NOTE — Progress Notes (Signed)
 "   Chief Complaint  Patient presents with   Wound Check    Wound check, right 5th toe, medial side closed and callused.  Right sub-met 1 closed and callused.  A1c is unknown. No anti coag.     HPI: 79 y.o. female presenting today following up for wound care of right foot ulcers.  Right subfirst metatarsal head ulceration with appears to be doing better, does appear covered with hemorrhagic callus.  Medial first toe area skin breakdown appears resolved.  Concern about new ulceration right fifth toe interdigital aspect.  Past Medical History:  Diagnosis Date   Bronchitis    touch q now and then (02/17/2012)   Depression    Diverticulitis    History of blood transfusion 1945   when I was 44 days old (02/17/2012)   History of transient ischemic attack (TIA) 2001   possibly; never really pinned down what it was (02/17/2012)   Hyperlipidemia    Hypertension    Migraines    outgrew them I guess (02/17/2012)   OSA on CPAP    Pneumonia 12/2010   Shortness of breath    occasionally; at any time (02/17/2012)   Type II diabetes mellitus (HCC)    Past Surgical History:  Procedure Laterality Date   ABDOMINAL HYSTERECTOMY  2000's   APPENDECTOMY  04/1988   CARDIAC CATHETERIZATION  02/13/2012   COLECTOMY  01/18/1988   took 3 feet out; result of lots of burst diverticula (02/17/2012)   COLECTOMY  04/1988   9 more inches of colon (02/17/2012)   COLOSTOMY  01/18/1988   COLOSTOMY REVERSAL  04/1988   CORONARY ANGIOPLASTY WITH STENT PLACEMENT  02/17/2012   1; first one ever (02/17/2012)   LEFT OOPHORECTOMY  1989   PERCUTANEOUS CORONARY STENT INTERVENTION (PCI-S) N/A 02/17/2012   Procedure: PERCUTANEOUS CORONARY STENT INTERVENTION (PCI-S);  Surgeon: Victory LELON Claudene DOUGLAS, MD;  Location: [facility] CATH LAB;  Service: Cardiovascular;  Laterality: N/A;   Allergies[1]    PHYSICAL EXAM: There were no vitals filed for this visit.  General: The patient is alert and oriented x3 in no acute distress.  Dermatology:  Skin is warm, dry and supple bilateral lower extremities. Interspaces are clear of maceration and debris.      Wound 1:  Location: Right subfirst metatarsal head        Depth: Partial-thickness        Wound Border: Hemorrhagic callus        Wound Base: Atrophic skin, callus        Drainage: None       Odor?:  No        Surrounding Tissue: Stable        Infected?:  No        Necrosis?:  No        Pain?:  Tender on palpation        Tunneling: No       Dimensions (cm): Predebridement measurements limited by hemorrhagic callus, postdebridement measurements 0.5 x 0.5 x 0.2 cm.  Good wound healing noted.  Wound 2:  Location: Right fifth toe interdigital aspect       Depth: Subcutaneous tissue       Wound Border: Hyperkeratotic callus       Wound Base: Fibrogranular base       Drainage: Minimal to scant       Odor?:  No       Surrounding Tissue: Inflamed, nonerythematous       Infected?:  No  Necrosis?:  Loss of skin subcutaneous tissue       Pain?:  Yes       Tunneling: No       Dimensions (cm): Predebridement measurements limited by overlying callus, posterior measurement 0.5 x 0.5 x 0.2 cm without exposed, bleeding wound bed, predominant fibrogranular tissue.  Vascular: DP and PT pulses faintly palpable.  Cap refill 3 to 5 seconds to digits.  Decreased pedal hair growth.  No significant edema.  Neurological: Light touch sensation decreased to digits.  Decreased protective sensation  Musculoskeletal Exam: Prominent first metatarsal head.  Rigid hammertoe contractures with contracted fifth toe dorsally.      No data to display              ASSESSMENT / PLAN OF CARE: 1. Ulcer of right foot with fat layer exposed (HCC)   2. Type II diabetes mellitus with peripheral circulatory disorder (HCC)      Meds ordered this encounter  Medications   mupirocin  ointment (BACTROBAN ) 2 %    Sig: Apply 1 Application topically 2 (two) times daily.    Dispense:  30 g    Refill:  2    VAS US  ABI WITH/WO TBI-baseline ABIs ordered due to recurrent and nonhealing ulcers right foot  Excisional Debridement Note   Indication/Diagnosis: Diabetic ulceration of right fifth toe interdigital aspect with fat layer exposed, partial-thickness ulcer right subfirst metatarsal head Frequency: 2 to 3 weeks (Repeat Debridement) Response to prior treatment: n/a due to new onset, right subfirst metatarsal head improving  Pre-Procedure Wound Assessment: Wound Location: Fifth toe medial aspect right foot Size (cm): Overlying callus limiting accurate measurement approximately 0.5 x 0.5 cm in diameter Infection: absent Necrotic/Devitalized Tissue:present- 100% % overlying nonviable callus Healing Barriers: biofilm and callus, digital deformity   Procedure: Verbal consent given by patient, and correct patient, procedure, and site were identified prior to beginning the procedure.  Type: Sharp Tool Used: scalpel Debridement depth beyond dead/damaged tissue down to healthy viable tissue: yes involving subcutaneous tissue Deepest layer of tissue removed: subcutaneous tissue    Nature of tissue removed: Devitalized Tissue, exuberant callus Irrigation fluid type: Other: Wound cleanser Irrigation volume (mL): 3 cc Blood Loss: minimal   Post-Procedure: Size After (cm): L 0.5 x W 0.5 x D 0.2 cm   Plan:  -Obtain baseline ABI due to recurring and nonhealing wounds - Prescription for mupirocin  sent to patient's pharmacy for daily applications with dry bandages to the fifth toe and also the subfirst metatarsal head area - Reviewed padding with offloading felt padding, lambswool and toe spacers for the fifth toe Monitor for signs of wounds worsening or developing signs of infection -Continue tight glucose control   Return in about 3 weeks (around 03/23/2024) for Wound Care.   Izamar Linden L. Lamount MAUL, AACFAS [facility]     [address] [place], [place] [address]                Office [phone]  Fax [phone]       [1]  Allergies Allergen Reactions   Erythromycin Other (See Comments)    upset stomach (02/17/2012)   Penicillins Other (See Comments)    >50 yr ago, I had strept throat; had gotten  lots and lots of PCN; last dose caused me to stop breathing (02/17/2012)   Tetracycline Other (See Comments)    upset stomach (02/17/2012)   Sulfonamide Derivatives Rash and Other (See Comments)    upset stomach (02/17/2012)   Gabapentin      Per pt I broke out in a rash   Guaifenesin Er Other (See Comments)   Morphine Nausea And Vomiting   Sulfa Antibiotics Other (See Comments)   Sulfur    "

## 2024-03-16 ENCOUNTER — Ambulatory Visit: Admitting: Podiatry

## 2024-03-17 ENCOUNTER — Ambulatory Visit: Admitting: Podiatry

## 2024-03-17 DIAGNOSIS — L84 Corns and callosities: Secondary | ICD-10-CM | POA: Diagnosis not present

## 2024-03-17 DIAGNOSIS — E11621 Type 2 diabetes mellitus with foot ulcer: Secondary | ICD-10-CM

## 2024-03-17 NOTE — Progress Notes (Signed)
 "   Chief Complaint  Patient presents with   Wound Check    Right 5th toe on the side of the toe, closed and little painful but not a lot of callus build up. Right sub-met 1 callused and closed with dark under the callus.  A1c 5.6 Feb    HPI: 79 y.o. female presenting today for recheck of ulceration to the dorsal right fifth toe and right submet 1.  She is wearing her diabetic shoes and states that she wears them around the house as well.  She has a felt offloading pad on the bottom of the right foot.  Past Medical History:  Diagnosis Date   Bronchitis    touch q now and then (02/17/2012)   Depression    Diverticulitis    History of blood transfusion 1945   when I was 30 days old (02/17/2012)   History of transient ischemic attack (TIA) 2001   possibly; never really pinned down what it was (02/17/2012)   Hyperlipidemia    Hypertension    Migraines    outgrew them I guess (02/17/2012)   OSA on CPAP    Pneumonia 12/2010   Shortness of breath    occasionally; at any time (02/17/2012)   Type II diabetes mellitus (HCC)    Past Surgical History:  Procedure Laterality Date   ABDOMINAL HYSTERECTOMY  2000's   APPENDECTOMY  04/1988   CARDIAC CATHETERIZATION  02/13/2012   COLECTOMY  01/18/1988   took 3 feet out; result of lots of burst diverticula (02/17/2012)   COLECTOMY  04/1988   9 more inches of colon (02/17/2012)   COLOSTOMY  01/18/1988   COLOSTOMY REVERSAL  04/1988   CORONARY ANGIOPLASTY WITH STENT PLACEMENT  02/17/2012   1; first one ever (02/17/2012)   LEFT OOPHORECTOMY  1989   PERCUTANEOUS CORONARY STENT INTERVENTION (PCI-S) N/A 02/17/2012   Procedure: PERCUTANEOUS CORONARY STENT INTERVENTION (PCI-S);  Surgeon: Victory LELON Claudene DOUGLAS, MD;  Location: [facility] CATH LAB;  Service: Cardiovascular;  Laterality: N/A;   Allergies[1] Smoker?: former smoker, quit 50 years ago   PHYSICAL EXAM: General: The patient is alert and oriented x3 in no acute distress.  Dermatology: Skin is warm, dry and  supple bilateral lower extremities. Interspaces are clear of maceration and debris.      Wound 1:  Location: Right submet 1        Depth: Closed, callused only today (Wagner grade 0)        Wound Border: Hyperkeratotic        Wound Base: Hemorrhagic hyperkeratotic        Drainage: None       Odor?:  None        Surrounding Tissue: No erythema or edema        Infected?:  No        Necrosis?:  No        Pain?:  No        Tunneling: No       Dimensions (cm): Not applicable     Wound 2:  Location: Dorsal lateral right fifth toe PIPJ       Depth: Closed, callused only today (Wagner grade 0)       Wound Border: Hyperkeratotic       Wound Base: Hyperkeratotic       Drainage: None       Odor?:  None       Surrounding Tissue: No erythema or edema or calor  infected?:  No       Necrosis?:  No       Pain?:  Minimal       Tunneling: No       Dimensions (cm): Not applicable   Vascular: Pedal pulses are palpable  Musculoskeletal Exam: Hammertoe right fifth toe  ASSESSMENT / PLAN OF CARE: 1. Pre-ulcerative calluses   2. Type 2 diabetes mellitus with foot ulcer (CODE) (HCC)     Her diabetic right shoe was stretched and the fifth toe area to try to alleviate pressure on the dorsal lateral aspect of the fifth toe PIPJ.  Her diabetic insole was further modified on the underside to try and further offload the first metatarsal head.  The preulcerative calluses were sharply debrided of hyperkeratotic and devitalized soft tissue with sterile #312 blade to the level of epidermis.  Hemostasis obtained.  Antibiotic ointment and DSD applied.  Reviewed off-loading with patient.  No daily wound care needed at this point since the wounds are closed.  Return in about 4 weeks (around 04/14/2024) for f/u ulcers w Dr. Lamount.   Awanda CHARM Imperial, DPM, FACFAS [facility]     [address] [place] [address]                Office [phone]  Fax [phone]     [1]  Allergies Allergen Reactions   Erythromycin Other (See Comments)    upset stomach (02/17/2012)   Penicillins Other (See Comments)    >50 yr ago, I had strept throat; had gotten lots and lots of PCN; last dose caused me to stop breathing (02/17/2012)   Tetracycline Other (See Comments)    upset stomach (02/17/2012)   Sulfonamide Derivatives Rash and Other (See Comments)    upset stomach (02/17/2012)   Gabapentin      Per pt I broke out in a rash   Guaifenesin Er Other (See Comments)   Morphine Nausea And Vomiting   Sulfa Antibiotics Other (See Comments)   Sulfur    "

## 2024-03-23 ENCOUNTER — Other Ambulatory Visit

## 2024-04-06 ENCOUNTER — Ambulatory Visit: Admitting: Podiatry

## 2024-04-06 ENCOUNTER — Other Ambulatory Visit

## 2024-04-13 ENCOUNTER — Ambulatory Visit: Admitting: Podiatry
